# Patient Record
Sex: Female | Born: 1985
Health system: Southern US, Community
[De-identification: ages and names within clinical notes are randomized; demographics above are authoritative.]

## PROBLEM LIST (undated history)

## (undated) DIAGNOSIS — K219 Gastro-esophageal reflux disease without esophagitis: Secondary | ICD-10-CM

## (undated) DIAGNOSIS — K921 Melena: Secondary | ICD-10-CM

## (undated) DIAGNOSIS — K760 Fatty (change of) liver, not elsewhere classified: Secondary | ICD-10-CM

## (undated) DIAGNOSIS — M2550 Pain in unspecified joint: Secondary | ICD-10-CM

## (undated) DIAGNOSIS — F32A Depression, unspecified: Secondary | ICD-10-CM

## (undated) DIAGNOSIS — R7303 Prediabetes: Secondary | ICD-10-CM

## (undated) DIAGNOSIS — J383 Other diseases of vocal cords: Secondary | ICD-10-CM

## (undated) DIAGNOSIS — F1020 Alcohol dependence, uncomplicated: Secondary | ICD-10-CM

## (undated) DIAGNOSIS — G43909 Migraine, unspecified, not intractable, without status migrainosus: Secondary | ICD-10-CM

## (undated) DIAGNOSIS — R0602 Shortness of breath: Secondary | ICD-10-CM

## (undated) DIAGNOSIS — K7030 Alcoholic cirrhosis of liver without ascites: Secondary | ICD-10-CM

## (undated) DIAGNOSIS — E7800 Pure hypercholesterolemia, unspecified: Secondary | ICD-10-CM

## (undated) DIAGNOSIS — R002 Palpitations: Secondary | ICD-10-CM

## (undated) DIAGNOSIS — E669 Obesity, unspecified: Secondary | ICD-10-CM

## (undated) DIAGNOSIS — E559 Vitamin D deficiency, unspecified: Secondary | ICD-10-CM

## (undated) DIAGNOSIS — I10 Essential (primary) hypertension: Secondary | ICD-10-CM

## (undated) DIAGNOSIS — R519 Headache, unspecified: Secondary | ICD-10-CM

## (undated) DIAGNOSIS — B019 Varicella without complication: Secondary | ICD-10-CM

## (undated) DIAGNOSIS — E282 Polycystic ovarian syndrome: Secondary | ICD-10-CM

## (undated) DIAGNOSIS — R51 Headache: Secondary | ICD-10-CM

## (undated) DIAGNOSIS — F419 Anxiety disorder, unspecified: Secondary | ICD-10-CM

## (undated) DIAGNOSIS — E538 Deficiency of other specified B group vitamins: Secondary | ICD-10-CM

## (undated) DIAGNOSIS — M549 Dorsalgia, unspecified: Secondary | ICD-10-CM

## (undated) DIAGNOSIS — F319 Bipolar disorder, unspecified: Secondary | ICD-10-CM

## (undated) DIAGNOSIS — R079 Chest pain, unspecified: Secondary | ICD-10-CM

## (undated) DIAGNOSIS — F329 Major depressive disorder, single episode, unspecified: Secondary | ICD-10-CM

## (undated) DIAGNOSIS — D649 Anemia, unspecified: Secondary | ICD-10-CM

## (undated) DIAGNOSIS — N390 Urinary tract infection, site not specified: Secondary | ICD-10-CM

## (undated) DIAGNOSIS — B977 Papillomavirus as the cause of diseases classified elsewhere: Secondary | ICD-10-CM

## (undated) DIAGNOSIS — S2239XA Fracture of one rib, unspecified side, initial encounter for closed fracture: Secondary | ICD-10-CM

## (undated) HISTORY — DX: Headache, unspecified: R51.9

## (undated) HISTORY — DX: Anemia, unspecified: D64.9

## (undated) HISTORY — DX: Fatty (change of) liver, not elsewhere classified: K76.0

## (undated) HISTORY — DX: Anxiety disorder, unspecified: F41.9

## (undated) HISTORY — DX: Dorsalgia, unspecified: M54.9

## (undated) HISTORY — DX: Deficiency of other specified B group vitamins: E53.8

## (undated) HISTORY — DX: Pure hypercholesterolemia, unspecified: E78.00

## (undated) HISTORY — DX: Gastro-esophageal reflux disease without esophagitis: K21.9

## (undated) HISTORY — DX: Varicella without complication: B01.9

## (undated) HISTORY — DX: Obesity, unspecified: E66.9

## (undated) HISTORY — DX: Headache: R51

## (undated) HISTORY — DX: Alcohol dependence, uncomplicated: F10.20

## (undated) HISTORY — DX: Bipolar disorder, unspecified: F31.9

## (undated) HISTORY — DX: Vitamin D deficiency, unspecified: E55.9

## (undated) HISTORY — DX: Urinary tract infection, site not specified: N39.0

## (undated) HISTORY — DX: Prediabetes: R73.03

## (undated) HISTORY — DX: Palpitations: R00.2

## (undated) HISTORY — DX: Chest pain, unspecified: R07.9

## (undated) HISTORY — DX: Melena: K92.1

## (undated) HISTORY — DX: Fracture of one rib, unspecified side, initial encounter for closed fracture: S22.39XA

## (undated) HISTORY — DX: Pain in unspecified joint: M25.50

## (undated) HISTORY — PX: WISDOM TOOTH EXTRACTION: SHX21

## (undated) HISTORY — DX: Shortness of breath: R06.02

---
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
## 2007-03-23 ENCOUNTER — Inpatient Hospital Stay (HOSPITAL_COMMUNITY): Admission: AD | Admit: 2007-03-23 | Discharge: 2007-04-02 | Payer: Self-pay | Admitting: Obstetrics & Gynecology

## 2007-03-31 ENCOUNTER — Encounter (INDEPENDENT_AMBULATORY_CARE_PROVIDER_SITE_OTHER): Payer: Self-pay | Admitting: Obstetrics & Gynecology

## 2007-04-09 ENCOUNTER — Encounter: Admission: RE | Admit: 2007-04-09 | Discharge: 2007-05-09 | Payer: Self-pay | Admitting: Obstetrics & Gynecology

## 2007-05-10 ENCOUNTER — Encounter: Admission: RE | Admit: 2007-05-10 | Discharge: 2007-05-17 | Payer: Self-pay | Admitting: Obstetrics & Gynecology

## 2007-11-23 ENCOUNTER — Encounter: Admission: RE | Admit: 2007-11-23 | Discharge: 2008-02-21 | Payer: Self-pay | Admitting: Family Medicine

## 2008-02-07 ENCOUNTER — Ambulatory Visit (HOSPITAL_COMMUNITY): Admission: RE | Admit: 2008-02-07 | Discharge: 2008-02-07 | Payer: Self-pay | Admitting: Obstetrics and Gynecology

## 2008-08-09 ENCOUNTER — Ambulatory Visit (HOSPITAL_COMMUNITY): Admission: RE | Admit: 2008-08-09 | Discharge: 2008-08-09 | Payer: Self-pay | Admitting: Family Medicine

## 2008-08-09 ENCOUNTER — Encounter: Payer: Self-pay | Admitting: Pulmonary Disease

## 2008-08-21 ENCOUNTER — Ambulatory Visit: Payer: Self-pay | Admitting: Pulmonary Disease

## 2008-08-21 DIAGNOSIS — J383 Other diseases of vocal cords: Secondary | ICD-10-CM | POA: Insufficient documentation

## 2008-08-21 DIAGNOSIS — J45909 Unspecified asthma, uncomplicated: Secondary | ICD-10-CM | POA: Insufficient documentation

## 2008-08-21 DIAGNOSIS — I10 Essential (primary) hypertension: Secondary | ICD-10-CM | POA: Insufficient documentation

## 2008-09-14 ENCOUNTER — Encounter: Payer: Self-pay | Admitting: Pulmonary Disease

## 2008-09-14 ENCOUNTER — Encounter: Admission: RE | Admit: 2008-09-14 | Discharge: 2008-09-28 | Payer: Self-pay | Admitting: Pulmonary Disease

## 2008-10-15 ENCOUNTER — Encounter: Payer: Self-pay | Admitting: Pulmonary Disease

## 2008-11-02 ENCOUNTER — Emergency Department (HOSPITAL_BASED_OUTPATIENT_CLINIC_OR_DEPARTMENT_OTHER): Admission: EM | Admit: 2008-11-02 | Discharge: 2008-11-02 | Payer: Self-pay | Admitting: Emergency Medicine

## 2008-11-02 ENCOUNTER — Ambulatory Visit: Payer: Self-pay | Admitting: Interventional Radiology

## 2011-01-14 LAB — URINALYSIS, ROUTINE W REFLEX MICROSCOPIC
Bilirubin Urine: NEGATIVE
Ketones, ur: NEGATIVE mg/dL
Nitrite: NEGATIVE
Protein, ur: NEGATIVE mg/dL
Urobilinogen, UA: 0.2 mg/dL (ref 0.0–1.0)
pH: 7 (ref 5.0–8.0)

## 2011-01-14 LAB — PREGNANCY, URINE: Preg Test, Ur: NEGATIVE

## 2011-01-21 ENCOUNTER — Other Ambulatory Visit: Payer: Self-pay | Admitting: Obstetrics and Gynecology

## 2011-02-11 NOTE — Discharge Summary (Signed)
Brandi Kirk, Brandi Kirk               ACCOUNT NO.:  [id number]   MEDICAL RECORD NO.:  [id number]          PATIENT TYPE:  INP   LOCATION:  9315                          FACILITY:  [facility]   PHYSICIAN:  Alden Hipp, M.D.   DATE OF BIRTH:  Apr 23, 1986   DATE OF ADMISSION:  03/23/2007  DATE OF DISCHARGE:  04/02/2007                               DISCHARGE SUMMARY   FINAL DIAGNOSIS:  1. Intrauterine pregnancy at [redacted] weeks gestation, delivered  2. Preterm premature rupture of membranes, at 32 weeks  3. Spontaneous vaginal delivery of a female infant with Apgars of 7 and      8.   COMPLICATIONS:  None.   This 25 year old, G1, P0 presents at [redacted] weeks gestation with preterm  premature rupture of membranes.  The patient started developing leaking  of fluid while she was visiting in [place].  On March 15, 2007,  preterm premature rupture of membranes was confirmed there.  She  received a course of betamethasone and 6 days of IV antibiotics.  The  patient wanted to return to [place], left [place] against medical  advice, and presents now to the [facility].  The patient did have  an ultrasound that showed an AFI of 4.3.  The baby was having  appropriate growth and was vertex in presentation.  Her antepartum  course had been complicated by an abnormal 1-hour sugar test; her 3-hour  was normal.  She also has a history of bipolar disorder, is not on any  medication during this pregnancy, and is also needing a rubella vaccine  for an equivocal immune status.  The patient was admitted for  observation at this time.  She was continued on amoxicillin with an  unknown group B strep status.  The patient said her group B strep was  positive in [place].  We were trying to get those records.  The  patient continued to be monitored and was doing well.  No signs of  chorioamnionitis.  A decision was made that an induction would be begun  on April 02, 2007 at 34 weeks, but starting around  hospital day #8, the  patient started to have some contractions.  When she was checked by Dr.  Stann Mainland on the morning of March 31, 2007, she was about 6 cm dilated.  The  patient had a spontaneous vaginal delivery of a 5 pound 12 ounce female  infant with Apgars of 7 and 8.  The baby was taken to the NICU.  The  delivery was over an intact perineum.  Delivery went without  complications.  The patient's postoperative course was uncomplicated.  She did have a couple elevated blood pressures on postpartum day #1.  The baby was stable in the NICU.  The patient was felt ready for  discharge on postpartum day #2.   DIET:  She was sent home on a regular diet.   ACTIVITY:  Told to decrease activities.   DISCHARGE MEDICATIONS:  1. Told to continue her prenatal vitamins.  2. She was told she could use over-the-counter ibuprofen up to 600 mg  every 6 hours as needed for pain.   FOLLOWUP:  She was to follow up in our office in 4 weeks, and of course  to call with any increased pain, bleeding, fever or problems.   LABORATORIES ON DISCHARGE:  Hemoglobin of 11.5, white blood cell count  of 12.4, platelets of 252,000.  Group B strep culture that we had  obtained in our hospital was negative.      Jeannette How, P.A.-C.      Alden Hipp, M.D.  Electronically Signed    MB/MEDQ  D:  05/05/2007  T:  05/05/2007  Job:  [id number]

## 2011-07-15 LAB — CBC
HCT: 34.3 — ABNORMAL LOW
Hemoglobin: 12.3
MCHC: 33.5
MCHC: 33.6
MCV: 88.6
MCV: 88.6
Platelets: 237
Platelets: 256
RBC: 3.87
RBC: 3.93
RDW: 13.2
WBC: 12.4 — ABNORMAL HIGH
WBC: 17.4 — ABNORMAL HIGH

## 2011-07-15 LAB — COMPREHENSIVE METABOLIC PANEL
ALT: 22
AST: 24
Albumin: 2.2 — ABNORMAL LOW
Calcium: 9
Chloride: 107
Creatinine, Ser: 0.54
GFR calc Af Amer: >60
Sodium: 136
Total Bilirubin: 0.9

## 2011-07-16 LAB — DIFFERENTIAL
Basophils Relative: 1
Eosinophils Absolute: 0
Eosinophils Absolute: 0.1
Eosinophils Relative: 0
Eosinophils Relative: 1
Lymphocytes Relative: 18 — ABNORMAL LOW
Lymphs Abs: 1.6
Lymphs Abs: 2.4
Monocytes Relative: 11 — ABNORMAL HIGH
Monocytes Relative: 8
Neutrophils Relative %: 77 — ABNORMAL HIGH

## 2011-07-16 LAB — CBC
HCT: 35.6 — ABNORMAL LOW
HCT: 35.7 — ABNORMAL LOW
HCT: 36
Hemoglobin: 12.3
MCHC: 33.2
MCHC: 34.4
MCV: 88.9
MCV: 89
MCV: 89.9
Platelets: 251
Platelets: 258
RBC: 3.97
RBC: 4.05
RDW: 12.8
WBC: 12.1 — ABNORMAL HIGH
WBC: 13.2 — ABNORMAL HIGH

## 2011-09-12 ENCOUNTER — Emergency Department
Admission: EM | Admit: 2011-09-12 | Discharge: 2011-09-12 | Disposition: A | Discharge status: Home / Self Care | Payer: Self-pay | Source: Home / Self Care | Attending: Emergency Medicine | Admitting: Emergency Medicine

## 2011-09-12 ENCOUNTER — Encounter: Payer: Self-pay | Admitting: Emergency Medicine

## 2011-09-12 DIAGNOSIS — J069 Acute upper respiratory infection, unspecified: Secondary | ICD-10-CM

## 2011-09-12 DIAGNOSIS — J329 Chronic sinusitis, unspecified: Secondary | ICD-10-CM

## 2011-09-12 MED ORDER — AMOXICILLIN 875 MG PO TABS: 875.0000 mg | ORAL_TABLET | Freq: Two times a day (BID) | ORAL | Status: AC

## 2011-09-12 NOTE — ED Provider Notes (Signed)
History     CSN: [id number] Arrival date & time: 09/12/2011 10:10 AM   First MD Initiated Contact with Patient 09/12/11 1006      Chief Complaint  Patient presents with  . Nasal Congestion    (Consider location/radiation/quality/duration/timing/severity/associated sxs/prior treatment) HPI Brandi Kirk is a 25 y.o. female who complains of onset of cold symptoms for 2 days.  She has been sick over the last week or 2 with upper respiratory infection also. No sore throat - resolved + cough No pleuritic pain No wheezing + nasal congestion + post-nasal drainage ++ sinus pain/pressure No chest congestion No itchy/red eyes No earache No hemoptysis No SOB No chills/sweats + fever - resolved No nausea No vomiting No abdominal pain No diarrhea No skin rashes No fatigue No myalgias + headache    History reviewed. No pertinent past medical history.  No past surgical history on file.  No family history on file.  History  Substance Use Topics  . Smoking status: Not on file  . Smokeless tobacco: Not on file  . Alcohol Use: Not on file    OB History    Grav Para Term Preterm Abortions TAB SAB Ect Mult Living                  Review of Systems  Allergies  Review of patient's allergies indicates no known allergies.  Home Medications   Current Outpatient Rx  Name Route Sig Dispense Refill  . COLD AND FLU PO Oral Take by mouth.        BP 119/87  Pulse 86  Temp(Src) 98.5 F (36.9 C) (Oral)  Resp 18  Ht 5' 4"  (1.626 m)  Wt 234 lb (106.142 kg)  BMI 40.17 kg/m2  SpO2 97%  Physical Exam  Nursing note and vitals reviewed. Constitutional: She is oriented to person, place, and time. She appears well-developed and well-nourished.  HENT:  Head: Normocephalic and atraumatic.  Right Ear: Tympanic membrane, external ear and ear canal normal.  Left Ear: Tympanic membrane, external ear and ear canal normal.  Nose: Mucosal edema and rhinorrhea present. Right sinus  exhibits maxillary sinus tenderness. Left sinus exhibits maxillary sinus tenderness.  Mouth/Throat: Posterior oropharyngeal erythema present. No oropharyngeal exudate or posterior oropharyngeal edema.  Eyes: No scleral icterus.  Neck: Neck supple.  Cardiovascular: Regular rhythm and normal heart sounds.   Pulmonary/Chest: Effort normal and breath sounds normal. No respiratory distress.  Neurological: She is alert and oriented to person, place, and time.  Skin: Skin is warm and dry.  Psychiatric: She has a normal mood and affect. Her speech is normal.    ED Course  Procedures (including critical care time)  Labs Reviewed - No data to display No results found.   1. Acute upper respiratory infections of unspecified site       MDM  1)  Take the prescribed antibiotic as instructed. 2)  Use nasal saline solution (over the counter) at least 3 times a day. 3)  Use over the counter decongestants like Zyrtec-D every 12 hours as needed to help with congestion.  If you have hypertension, do not take medicines with sudafed.  4)  Can take tylenol every 6 hours or motrin every 8 hours for pain or fever. 5)  Follow up with your primary doctor if no improvement in 5-7 days, sooner if increasing pain, fever, or new symptoms.       Desiree Lucy, MD 09/12/11 1021

## 2011-09-12 NOTE — ED Notes (Signed)
Congestion since yesterday

## 2012-07-01 ENCOUNTER — Encounter: Payer: Self-pay | Admitting: *Deleted

## 2012-07-01 ENCOUNTER — Emergency Department
Admission: EM | Admit: 2012-07-01 | Discharge: 2012-07-01 | Disposition: A | Discharge status: Home / Self Care | Payer: Self-pay | Source: Home / Self Care | Attending: Family Medicine | Admitting: Family Medicine

## 2012-07-01 DIAGNOSIS — J209 Acute bronchitis, unspecified: Secondary | ICD-10-CM

## 2012-07-01 HISTORY — DX: Other diseases of vocal cords: J38.3

## 2012-07-01 MED ORDER — GUAIFENESIN-CODEINE 100-10 MG/5ML PO SYRP: 10.0000 mL | ORAL_SOLUTION | Freq: Every day | ORAL | Status: DC

## 2012-07-01 MED ORDER — CLARITHROMYCIN 500 MG PO TABS: 500.0000 mg | ORAL_TABLET | Freq: Two times a day (BID) | ORAL | Status: DC

## 2012-07-01 MED ORDER — PREDNISONE 20 MG PO TABS: 20.0000 mg | ORAL_TABLET | Freq: Two times a day (BID) | ORAL | Status: DC

## 2012-07-01 NOTE — ED Provider Notes (Signed)
History     CSN: [id number]  Arrival date & time 07/01/12  0093   First MD Initiated Contact with Patient 07/01/12 0827      Chief Complaint  Patient presents with  . Cough      HPI Comments: Patient complains of approximately 3 week history of gradually progressive URI symptoms beginning with a mild sore throat (now resolved), followed by progressive nasal congestion.  A cough started about 2 days ago.  Complains of fatigue and initial myalgias.  Cough is now worse at night and generally non-productive during the day.  There has been no pleuritic pain or wheezing, but she becomes short of breath with persistent coughing.  She often coughs until she gags.  She does not remember her last Tdap.   The history is provided by the patient.    Past Medical History  Diagnosis Date  . Vocal cord dysfunction   . Tension headache     History reviewed. No pertinent past surgical history.  Family History  Problem Relation Age of Onset  . Diabetes Mother   . Stroke Mother   . Rheum arthritis Sister   . Heart murmur Sister   . Heart murmur Brother     History  Substance Use Topics  . Smoking status: Current Some Day Smoker  . Smokeless tobacco: Not on file  . Alcohol Use: Yes    OB History    Grav Para Term Preterm Abortions TAB SAB Ect Mult Living                  Review of Systems + sore throat, resolved + cough No pleuritic pain, but has tightness in anterior chest No wheezing + nasal congestion ? post-nasal drainage No sinus pain/pressure No itchy/red eyes No earache No hemoptysis + SOB with activity No fever, + chills No nausea No vomiting No abdominal pain No diarrhea No urinary symptoms No skin rashes + fatigue No myalgias + headache Used OTC meds without relief  Allergies  Review of patient's allergies indicates no known allergies.  Home Medications   Current Outpatient Rx  Name Route Sig Dispense Refill  . CLARITHROMYCIN 500 MG PO TABS Oral  Take 1 tablet (500 mg total) by mouth 2 (two) times daily. 14 tablet 0  . GUAIFENESIN-CODEINE 100-10 MG/5ML PO SYRP Oral Take 10 mLs by mouth at bedtime. for cough 120 mL 0  . COLD AND FLU PO Oral Take by mouth.      Marland Kitchen PREDNISONE 20 MG PO TABS Oral Take 1 tablet (20 mg total) by mouth 2 (two) times daily. Take with food. 10 tablet 0    BP 111/74  Pulse 61  Temp 98.1 F (36.7 C) (Oral)  Resp 16  Ht 5' 4"  (1.626 m)  Wt 221 lb (100.245 kg)  BMI 37.93 kg/m2  SpO2 98%  Physical Exam Nursing notes and Vital Signs reviewed. Appearance:  Patient appears stated age, and in no acute distress but she coughs frequently.  Patient is obese (BMI 37.9) Eyes:  Pupils are equal, round, and reactive to light and accomodation.  Extraocular movement is intact.  Conjunctivae are not inflamed  Ears:  Canals normal.  Tympanic membranes normal.  Nose:  Mildly congested turbinates.  No sinus tenderness.    Pharynx:  Normal Neck:  Supple.   No adenopathy Lungs:  Clear to auscultation.  Breath sounds are equal.  Heart:  Regular rate and rhythm without murmurs, rubs, or gallops.  Abdomen:  Nontender without masses or  hepatosplenomegaly.  Bowel sounds are present.  No CVA or flank tenderness.  Extremities:  No edema.  No calf tenderness Skin:  No rash present.   ED Course  Procedures none      1. Acute bronchitis;  Suspect pertussis       MDM  Patient has no insurance and prefers to defer testing (chest X-ray and pertussis culture) Begin Biaxin and prednisone burst.  Cough suppressant at bedtime. Take plain Mucinex (guaifenesin) twice daily for cough and congestion.  Increase fluid intake, rest. May use Afrin nasal spray (or generic oxymetazoline) twice daily for about 5 days.  Also recommend using saline nasal spray several times daily and saline nasal irrigation (AYR is a common brand) Stop all antihistamines for now, and other non-prescription cough/cold preparations. Recommend a Tdap when well.    Follow-up with family doctor if not improving 7 to 10 days.         Kandra Nicolas, MD 07/01/12 1225

## 2012-07-01 NOTE — ED Notes (Signed)
Patient c/o productive cough x 3 weeks. HA, ear pain and fatigue. Taken Nyquil and Mucinex OTC.

## 2012-11-12 ENCOUNTER — Emergency Department
Admission: EM | Admit: 2012-11-12 | Discharge: 2012-11-12 | Disposition: A | Discharge status: Home / Self Care | Payer: Self-pay | Source: Home / Self Care

## 2012-11-12 ENCOUNTER — Encounter: Payer: Self-pay | Admitting: *Deleted

## 2012-11-12 DIAGNOSIS — Z3202 Encounter for pregnancy test, result negative: Secondary | ICD-10-CM

## 2012-11-12 DIAGNOSIS — R5381 Other malaise: Secondary | ICD-10-CM

## 2012-11-12 DIAGNOSIS — R5383 Other fatigue: Secondary | ICD-10-CM

## 2012-11-12 HISTORY — DX: Papillomavirus as the cause of diseases classified elsewhere: B97.7

## 2012-11-12 HISTORY — DX: Polycystic ovarian syndrome: E28.2

## 2012-11-12 LAB — POCT CBC W AUTO DIFF (K'VILLE URGENT CARE)

## 2012-11-12 LAB — COMPREHENSIVE METABOLIC PANEL
BUN: 13 mg/dL (ref 6–23)
CO2: 24 mEq/L (ref 19–32)
Creat: 0.53 mg/dL (ref 0.50–1.10)
Glucose, Bld: 107 mg/dL — ABNORMAL HIGH (ref 70–99)
Sodium: 139 mEq/L (ref 135–145)
Total Bilirubin: 0.7 mg/dL (ref 0.3–1.2)
Total Protein: 7 g/dL (ref 6.0–8.3)

## 2012-11-12 LAB — POCT URINE PREGNANCY: Preg Test, Ur: NEGATIVE

## 2012-11-12 NOTE — ED Provider Notes (Signed)
History     CSN: [id number]  Arrival date & time 11/12/12  1603   None     Chief Complaint  Patient presents with  . Nausea  . Emesis  . Fatigue  . Headache  . Dizziness       HPI Comments: Patient complains of 4 to 5 week history of persistent fatigue, hot flashes, intermittent dizziness, and nausea with occasional vomiting.  No fevers, chills, and sweats.  No urinary symptoms.  She states that she feels like she is pregnant, but has done four home pregnancy tests that were negative.  She has the [facility] IUD in place that is now due for removal.  She has had no pelvic pain, and no vaginal spotting for about 1.5 months.  The history is provided by the patient.    Past Medical History  Diagnosis Date  . Vocal cord dysfunction   . Tension headache   . HPV in female   . PCOS (polycystic ovarian syndrome)     History reviewed. No pertinent past surgical history.  Family History  Problem Relation Age of Onset  . Diabetes Mother   . Stroke Mother   . Rheum arthritis Sister   . Heart murmur Sister   . Heart murmur Brother     History  Substance Use Topics  . Smoking status: Current Some Day Smoker  . Smokeless tobacco: Not on file  . Alcohol Use: Yes    OB History   Grav Para Term Preterm Abortions TAB SAB Ect Mult Living                  Review of Systems  Constitutional: Positive for fatigue. Negative for fever, chills, diaphoresis, activity change, appetite change and unexpected weight change.  Eyes: Negative.   Respiratory: Positive for shortness of breath.   Cardiovascular: Negative.   Gastrointestinal: Positive for nausea and vomiting. Negative for diarrhea.  Endocrine: Negative.   Genitourinary: Negative.   Musculoskeletal: Negative.   Skin: Negative.     Allergies  Review of patient's allergies indicates no known allergies.  Home Medications   Current Outpatient Rx  Name  Route  Sig  Dispense  Refill  . clarithromycin (BIAXIN) 500 MG tablet  Oral   Take 1 tablet (500 mg total) by mouth 2 (two) times daily.   14 tablet   0   . guaiFENesin-codeine (ROBITUSSIN AC) 100-10 MG/5ML syrup   Oral   Take 10 mLs by mouth at bedtime. for cough   120 mL   0   . Nutritional Supplements (COLD AND FLU PO)   Oral   Take by mouth.           . predniSONE (DELTASONE) 20 MG tablet   Oral   Take 1 tablet (20 mg total) by mouth 2 (two) times daily. Take with food.   10 tablet   0     BP 114/74  Pulse 66  Temp(Src) 98 F (36.7 C) (Oral)  Ht 5' 4"  (1.626 m)  Wt 205 lb (92.987 kg)  BMI 35.17 kg/m2  SpO2 98%  Physical Exam Nursing notes and Vital Signs reviewed. Appearance:  Patient appears stated age, and in no acute distress.  Patient is obese (BMI 35.2) Eyes:  Pupils are equal, round, and reactive to light and accomodation.  Extraocular movement is intact.  Conjunctivae are not inflamed  Ears:  Canals normal.  Tympanic membranes normal.  Nose:  Mildly congested turbinates.  No sinus tenderness.  Mouth:  Normal  Pharynx:  Normal Neck:  Supple.  No adenopathy or thyromegaly. Lungs:  Clear to auscultation.  Breath sounds are equal.  Heart:  Regular rate and rhythm without murmurs, rubs, or gallops.  Abdomen:  Nontender without masses or hepatosplenomegaly.  Bowel sounds are present.  No CVA or flank tenderness.  Extremities:  No edema.  No calf tenderness Skin:  No rash present.   ED Course  Procedures  none  Labs Reviewed  COMPREHENSIVE METABOLIC PANEL - Abnormal; Notable for the following:    Glucose, Bld 107 (*)    All other components within normal limits   Narrative:    Performed at:  [facility], [address]                [place], [place] [address]  HCG, QUANTITATIVE, PREGNANCY   Narrative:    Performed at:  [facility], [address]                [place], [place] [address]  TSH   Narrative:    Performed at:  [facility]                 [address], [address]                [place], [place] [address]  POCT URINE PREGNANCY negative  POCT CBC W AUTO DIFF ([facility])  WBC 8.6; LY 30.9; MO 4.3; GR 64.8; Hgb 12.4; Platelets 284       1. Other malaise and fatigue; Rule out pregnancy       MDM  Check serum HCG.  TSH and CMP pending. Followup with GYN        Kandra Nicolas, MD 11/14/12 [phone]

## 2012-11-12 NOTE — ED Notes (Signed)
Pt c/o nausea, vomiting, fatigue, hot flashes, HA and dizziness x 4 wks on and off. She reports taking a home pregnancy test yesterday that was negative.

## 2013-05-26 ENCOUNTER — Encounter: Payer: Self-pay | Admitting: *Deleted

## 2013-05-26 ENCOUNTER — Emergency Department
Admission: EM | Admit: 2013-05-26 | Discharge: 2013-05-26 | Disposition: A | Discharge status: Home / Self Care | Payer: Self-pay | Source: Home / Self Care | Attending: Emergency Medicine | Admitting: Emergency Medicine

## 2013-05-26 DIAGNOSIS — J309 Allergic rhinitis, unspecified: Secondary | ICD-10-CM

## 2013-05-26 DIAGNOSIS — R21 Rash and other nonspecific skin eruption: Secondary | ICD-10-CM

## 2013-05-26 HISTORY — DX: Depression, unspecified: F32.A

## 2013-05-26 HISTORY — DX: Major depressive disorder, single episode, unspecified: F32.9

## 2013-05-26 MED ORDER — PREDNISONE 10 MG PO TABS: ORAL_TABLET | ORAL | Status: DC

## 2013-05-26 MED ORDER — PERMETHRIN 5 % EX CREA: TOPICAL_CREAM | Freq: Once | CUTANEOUS | Status: DC

## 2013-05-26 NOTE — ED Notes (Signed)
Perian c/o dry itchy rash that started on her buttocks 3 weeks ago while walking around [place]. Rash has since spread to her arms, chest and trunk. Used benadryl for itching. Also c/o bilateral ear pain for 3 weeks. Denies fever.

## 2013-05-26 NOTE — ED Provider Notes (Signed)
CSN: [id number]     Arrival date & time 05/26/13  4235 History   First MD Initiated Contact with Patient 05/26/13 0830     Chief Complaint  Patient presents with  . Rash  . Otalgia   (Consider location/radiation/quality/duration/timing/severity/associated sxs/prior Treatment) HPI This patient complains of a RASH  Location: whole body, started on buttock, has spread to whole body (spared face)  Onset: 3 weeks ago when at [place]: intermittently worsening Self-treated with: Benedryl             Improvement with treatment: helps  History Itching: yes  Tenderness: no  New medications/antibiotics: no  Pet exposure: no  Recent travel or tropical exposure: yes, to [employer] soaps, shampoos, detergent, clothing: no  Tick/insect exposure: no   Red Flags Feeling ill: no  Fever: no  Facial/tongue swelling/difficulty breathing:  no  Diabetic or immunocompromised: no    She also states that her ears are hurting intermittently for the last few weeks. Has allergies and has been having some rhinorrhea and congestion, treated with Sudafed and Benedryl.  No fever, chills, cough.    Past Medical History  Diagnosis Date  . Vocal cord dysfunction   . Tension headache   . HPV in female   . PCOS (polycystic ovarian syndrome)   . Depression    History reviewed. No pertinent past surgical history. Family History  Problem Relation Age of Onset  . Diabetes Mother   . Stroke Mother   . Rheum arthritis Sister   . Heart murmur Sister   . Heart murmur Brother    History  Substance Use Topics  . Smoking status: Former Research scientist (life sciences)  . Smokeless tobacco: Never Used  . Alcohol Use: Yes   OB History   Grav Para Term Preterm Abortions TAB SAB Ect Mult Living                 Review of Systems  All other systems reviewed and are negative.    Allergies  Ciprofloxacin  Home Medications   Current Outpatient Rx  Name  Route  Sig  Dispense  Refill  . FLUoxetine (PROZAC)  20 MG tablet   Oral   Take 25 mg by mouth daily.         . permethrin (ACTICIN) 5 % cream   Topical   Apply topically once. Apply chin to toes, keep on 12 hours, wash off.  Repeat in 2 weeks if needed.   60 g   1   . predniSONE (DELTASONE) 10 MG tablet      75m BID x3 days, then 161mBID x3 days, then 1073mD x3 days, then stop.  Disp QS   21 tablet   0    BP 118/85  Pulse 76  Temp(Src) 98.2 F (36.8 C) (Oral)  Resp 14  Ht 5' 4"  (1.626 m)  Wt 215 lb (97.523 kg)  BMI 36.89 kg/m2  SpO2 96% Physical Exam  Nursing note and vitals reviewed. Constitutional: She is oriented to person, place, and time. She appears well-developed and well-nourished.  HENT:  Head: Normocephalic and atraumatic.  Right Ear: Tympanic membrane, external ear and ear canal normal.  Left Ear: Tympanic membrane, external ear and ear canal normal.  Nose: Nose normal.  Mouth/Throat: Uvula is midline and oropharynx is clear and moist.  Eyes: No scleral icterus.  Neck: Neck supple.  Cardiovascular: Regular rhythm and normal heart sounds.   Pulmonary/Chest: Effort normal and breath sounds normal.  No respiratory distress.  Neurological: She is alert and oriented to person, place, and time.  Skin: Skin is warm and dry.  Scattered erythematous maculopapular rash with excoriations, no cellulitis, no tenderness, no induration or fluctuance  Psychiatric: She has a normal mood and affect. Her speech is normal.    ED Course  Procedures (including critical care time) Labs Review Labs Reviewed - No data to display Imaging Review No results found.  MDM   1. Rash and nonspecific skin eruption    Due to patient's rash, will treat with prednisone taper for 9 days which should treat most underlying disorders.  DDx also includes scabies so will treat with Elimite cream just in case.  Ear pain will likely be helped with prednisone as I don't see any infectious cause.  Likely allergic congestion.  Follow up with  PCP if not improving in either of the issues.    Janeann Forehand, MD 05/26/13 0900

## 2014-08-08 ENCOUNTER — Emergency Department (INDEPENDENT_AMBULATORY_CARE_PROVIDER_SITE_OTHER): Payer: Self-pay

## 2014-08-08 ENCOUNTER — Encounter: Payer: Self-pay | Admitting: *Deleted

## 2014-08-08 ENCOUNTER — Emergency Department
Admission: EM | Admit: 2014-08-08 | Discharge: 2014-08-08 | Disposition: A | Discharge status: Home / Self Care | Payer: Self-pay | Source: Home / Self Care | Attending: Emergency Medicine | Admitting: Emergency Medicine

## 2014-08-08 DIAGNOSIS — M542 Cervicalgia: Secondary | ICD-10-CM

## 2014-08-08 DIAGNOSIS — T1490XA Injury, unspecified, initial encounter: Secondary | ICD-10-CM

## 2014-08-08 DIAGNOSIS — S161XXA Strain of muscle, fascia and tendon at neck level, initial encounter: Secondary | ICD-10-CM

## 2014-08-08 DIAGNOSIS — S1093XA Contusion of unspecified part of neck, initial encounter: Secondary | ICD-10-CM

## 2014-08-08 DIAGNOSIS — M4312 Spondylolisthesis, cervical region: Secondary | ICD-10-CM

## 2014-08-08 MED ORDER — IBUPROFEN 200 MG PO TABS: ORAL_TABLET | ORAL | Status: DC

## 2014-08-08 MED ORDER — CYCLOBENZAPRINE HCL 5 MG PO TABS: ORAL_TABLET | ORAL | Status: DC

## 2014-08-08 NOTE — ED Notes (Signed)
Pt c/o upper back and neck pain x 4 wks after her garage door closed on her. She c/o dizziness intermittently when turning her neck too fast.

## 2014-08-08 NOTE — Discharge Instructions (Signed)
Soft Tissue Injury of the Neck  A soft tissue injury of the neck needs medical care right away. These injuries are often caused by a direct hit to the neck. Some injuries do not break the skin (blunt injury). Some injuries do break the skin (penetrating injury) and create an open wound. You may feel fine at first, but the puffiness (swelling) in your throat can slowly make it harder to breathe. This could cause serious or life-threatening injury. There could be damage to major blood vessels and nerves in the neck. Neck injuries need to be checked by a doctor. HOME CARE  If the skin was broken, keep the area clean and dry. Care for your wound as told by your doctor.  Follow your doctor's diet advice.  Follow your doctor's advice about using your voice.  Only take medicines as told by your doctor.  Keep your head and neck raised (elevated). Do this while you sleep, too. GET HELP RIGHT AWAY IF:  Your voice gets weaker.  Your puffiness or bruising does not get better.  You have problems with your medicines.  You see fluid coming from the wound.  Your pain gets worse, or you have trouble swallowing.  You cough up blood.  You have trouble breathing.  You start to drool.  You start throwing up (vomiting).  You have new puffiness in the neck or face.  You have a temperature by mouth above 102 F (38.9 C), not controlled by medicine. MAKE SURE YOU:  Understand these instructions.  Will watch your condition.  Will get help right away if you are not doing well or get worse. Document Released: 12/26/2010 Document Revised: 12/08/2011 Document Reviewed: 12/26/2010 [facility] Patient Information 2015 [place], [place]. This information is not intended to replace advice given to you by your health care provider. Make sure you discuss any questions you have with your health care provider.

## 2014-08-08 NOTE — ED Provider Notes (Signed)
CSN: [id number]     Arrival date & time 08/08/14  0813 History   First MD Initiated Contact with Patient 08/08/14 0820     Chief Complaint  Patient presents with  . Neck Injury  . Back Pain  Pt c/o upper back and neck pain x 4 wks after her garage door closed on her. She c/o dizziness intermittently when turning her neck too fast.   HPI About 3-1/2 or 4 weeks ago, she was underneath her garage door which was coming down, and the garage door struck her posterior neck. No loss of consciousness. She states she was initially dazed for a minute or 2. No focal neurologic symptoms. No focal weakness or numbness.  Ever since then, has "sore, radiating" pain posterior neck, constant, 7 out of 10 that can radiate to bilateral paracervical area and posterior occipital area. This worsens when she turns her head to either side or flexes or extends her neck.. Has a mild, general posterior headache. No vision change. No photophobia. Denies nausea or vomiting. Complains of intermittent intermittent lightheadedness when turning her neck too fast. No definite vertigo. No balance problems. Questioned her further about any back pain, and she states the neck pain radiates down from the upper C-spine to the lower to the C-spine, but no other back pain. No shoulder pain. No extremity symptoms. She is able to concentrate and think normally. History of cluster headaches about a year ago, but she states this does not feel like a cluster headache.  Denies chance of pregnancy. Has Mirena IUD. Past Medical History  Diagnosis Date  . Vocal cord dysfunction   . Tension headache   . HPV in female   . PCOS (polycystic ovarian syndrome)   . Depression    Past Surgical History  Procedure Laterality Date  . Wisdom tooth extraction     Family History  Problem Relation Age of Onset  . Diabetes Mother   . Stroke Mother   . Hypertension Mother   . Heart murmur Mother   . Rheum arthritis Sister   . Heart murmur  Sister   . Heart murmur Brother    History  Substance Use Topics  . Smoking status: Current Every Day Smoker -- 1.00 packs/day    Types: Cigarettes  . Smokeless tobacco: Never Used  . Alcohol Use: Yes   OB History    No data available     Review of Systems  Constitutional: Negative for fever.  Respiratory: Negative for shortness of breath.   Cardiovascular: Negative for chest pain.  Gastrointestinal: Negative for nausea, vomiting and abdominal pain.  Neurological: Negative for seizures and syncope.  Psychiatric/Behavioral: Negative for hallucinations and confusion.  All other systems reviewed and are negative.   Allergies  Ciprofloxacin  Home Medications   Prior to Admission medications   Medication Sig Start Date End Date Taking? Authorizing Provider  phentermine 37.5 MG capsule Take 37.5 mg by mouth every morning.   Yes Historical Provider, MD  cyclobenzaprine (FLEXERIL) 5 MG tablet Take 1 or 2 every 8 hours as needed for muscle relaxant. Caution: May cause drowsiness. 08/08/14   Jacqulyn Cane, MD  ibuprofen (ADVIL,MOTRIN) 200 MG tablet Take three tablets ( 600 milligrams total) every 6 with food as needed for pain. 08/08/14   Jacqulyn Cane, MD   BP 120/80 mmHg  Pulse 66  Temp(Src) 98.4 F (36.9 C) (Oral)  Resp 18  Ht 5' 4"  (1.626 m)  Wt 192 lb (87.091 kg)  BMI 32.94 kg/m2  SpO2 99% Physical Exam  Constitutional: She is oriented to person, place, and time. She appears well-developed and well-nourished.  Non-toxic appearance. She appears distressed (Uncomfortable from posterior neck pain.).  HENT:  Head: Normocephalic and atraumatic. Head is without abrasion and without contusion.  Right Ear: External ear normal.  Left Ear: External ear normal.  Nose: Nose normal.  Mouth/Throat: Oropharynx is clear and moist.  Eyes: Conjunctivae and EOM are normal. Pupils are equal, round, and reactive to light. Right conjunctiva has no hemorrhage. Left conjunctiva has no  hemorrhage. No scleral icterus.  Fundoscopic exam:      The right eye shows no hemorrhage and no papilledema.       The left eye shows no hemorrhage and no papilledema.  Neck: Trachea normal. Neck supple. Normal carotid pulses present. No thyroid mass present.  Cardiovascular: Regular rhythm and normal heart sounds.   Pulmonary/Chest: Effort normal and breath sounds normal. No respiratory distress.  Musculoskeletal:       Cervical back: She exhibits decreased range of motion (and posterior neck pain exacerbated with forward flexion and lateral bending), tenderness (Directly over the posterior occipital area, and all along C-spine in midline), bony tenderness and spasm (Posterior cervical muscles.). She exhibits no swelling, no edema, no deformity, no laceration and normal pulse.       Thoracic back: Normal.       Lumbar back: Normal.  Lymphadenopathy:       Head (right side): No occipital adenopathy present.       Head (left side): No occipital adenopathy present.    She has no cervical adenopathy.  Neurological: She is alert and oriented to person, place, and time. She has normal strength and normal reflexes. She displays no atrophy and no tremor. No cranial nerve deficit or sensory deficit. She exhibits normal muscle tone. Gait normal.  Reflex Scores:      Tricep reflexes are 2+ on the right side and 2+ on the left side.      Bicep reflexes are 2+ on the right side and 2+ on the left side.      Brachioradialis reflexes are 2+ on the right side and 2+ on the left side.      Patellar reflexes are 2+ on the right side and 2+ on the left side.      Achilles reflexes are 2+ on the right side and 2+ on the left side. Skin: Skin is warm, dry and intact. No bruising, no laceration, no lesion and no rash noted.  Psychiatric: She has a normal mood and affect.  Nursing note and vitals reviewed.   ED Course  Procedures (including critical care time) Labs Review Labs Reviewed - No data to  display  Imaging Review Dg Cervical Spine Complete  08/08/2014   CLINICAL DATA:  Posterior neck pain for 3 weeks after hit by garage door. Initial encounter.  EXAM: CERVICAL SPINE  4+ VIEWS  COMPARISON:  None.  FINDINGS: No evidence of cervical spine fracture.  Slight (1-2 mm) anterolisthesis at C3-4. No prevertebral swelling. Degenerative changes or endplate erosion. Clear apical lungs.  IMPRESSION: 1.  No evidence of cervical spine fracture. 2. Slight C3-4 anterolisthesis. If ligamentous injury is a clinical possibility, flexion extension radiography could be performed to exclude hypermobility at this level.   Electronically Signed   By: Jorje Guild M.D.   On: 08/08/2014 09:06     MDM   1. Neck contusion, initial encounter   2. Posterior neck pain   3.  Injury   4. Neck strain, initial encounter    Reviewed above x-ray report at length. No evidence of C-spine fracture. She declined any other imaging after lengthy discussion.  Treatment options discussed, as well as risks, benefits, alternatives. Patient voiced understanding and agreement with the following plans: Discharge Medication List as of 08/08/2014  9:25 AM    START taking these medications   Details  cyclobenzaprine (FLEXERIL) 5 MG tablet Take 1 or 2 every 8 hours as needed for muscle relaxant. Caution: May cause drowsiness., Print    ibuprofen (ADVIL,MOTRIN) 200 MG tablet Take three tablets ( 600 milligrams total) every 6 with food as needed for pain., No Print       Other symptomatic care discussed. See detailed Instructions in AVS, which were given to patient. Verbal instructions also given. Risks, benefits, and alternatives of treatment options discussed. Questions invited and answered. Patient voiced understanding and agreement with plans.     Jacqulyn Cane, MD 08/08/14 [phone]

## 2014-11-14 ENCOUNTER — Encounter: Payer: Self-pay | Admitting: *Deleted

## 2014-11-14 ENCOUNTER — Emergency Department (INDEPENDENT_AMBULATORY_CARE_PROVIDER_SITE_OTHER): Payer: Self-pay

## 2014-11-14 ENCOUNTER — Emergency Department
Admission: EM | Admit: 2014-11-14 | Discharge: 2014-11-14 | Disposition: A | Discharge status: Home / Self Care | Payer: Self-pay | Source: Home / Self Care | Attending: Emergency Medicine | Admitting: Emergency Medicine

## 2014-11-14 DIAGNOSIS — M542 Cervicalgia: Secondary | ICD-10-CM

## 2014-11-14 DIAGNOSIS — S40012A Contusion of left shoulder, initial encounter: Secondary | ICD-10-CM

## 2014-11-14 DIAGNOSIS — M25512 Pain in left shoulder: Secondary | ICD-10-CM

## 2014-11-14 DIAGNOSIS — S161XXA Strain of muscle, fascia and tendon at neck level, initial encounter: Secondary | ICD-10-CM

## 2014-11-14 DIAGNOSIS — S40022A Contusion of left upper arm, initial encounter: Secondary | ICD-10-CM

## 2014-11-14 DIAGNOSIS — W19XXXA Unspecified fall, initial encounter: Secondary | ICD-10-CM

## 2014-11-14 MED ORDER — HYDROCODONE-ACETAMINOPHEN 5-325 MG PO TABS
2.0000 | ORAL_TABLET | ORAL | Status: DC | PRN
Start: 2014-11-14 — End: 2014-11-14

## 2014-11-14 MED ORDER — CYCLOBENZAPRINE HCL 5 MG PO TABS: ORAL_TABLET | ORAL | Status: DC

## 2014-11-14 MED ORDER — IBUPROFEN 800 MG PO TABS: 800.0000 mg | ORAL_TABLET | Freq: Three times a day (TID) | ORAL | Status: DC

## 2014-11-14 NOTE — ED Provider Notes (Signed)
CSN: [id number]     Arrival date & time 11/14/14  1038 History   First MD Initiated Contact with Patient 11/14/14 1120     Chief Complaint  Patient presents with  . Neck Pain  . Shoulder Injury   (Consider location/radiation/quality/duration/timing/severity/associated sxs/prior Treatment) Patient is a 29 y.o. female presenting with neck pain and shoulder injury. The history is provided by the patient. No language interpreter was used.  Neck Pain Pain location:  Generalized neck Quality:  Aching Pain radiates to:  Does not radiate Pain severity:  Moderate Pain is:  Same all the time Onset quality:  Gradual Duration:  4 days Timing:  Constant Progression:  Worsening Chronicity:  New Context: fall   Relieved by:  Muscle relaxants and NSAIDs Worsened by:  Twisting Ineffective treatments:  None tried Associated symptoms: no numbness, no paresis and no weakness   Risk factors: no hx of spinal trauma   Shoulder Injury    Past Medical History  Diagnosis Date  . Vocal cord dysfunction   . Tension headache   . HPV in female   . PCOS (polycystic ovarian syndrome)   . Depression    Past Surgical History  Procedure Laterality Date  . Wisdom tooth extraction     Family History  Problem Relation Age of Onset  . Diabetes Mother   . Stroke Mother   . Hypertension Mother   . Heart murmur Mother   . Rheum arthritis Sister   . Heart murmur Sister   . Heart murmur Brother    History  Substance Use Topics  . Smoking status: Current Every Day Smoker -- 1.00 packs/day    Types: Cigarettes  . Smokeless tobacco: Never Used  . Alcohol Use: Yes   OB History    No data available     Review of Systems  Musculoskeletal: Positive for neck pain.  Neurological: Negative for weakness and numbness.  All other systems reviewed and are negative.   Allergies  Ciprofloxacin and Septra  Home Medications   Prior to Admission medications   Medication Sig Start Date End Date Taking?  Authorizing Provider  cyclobenzaprine (FLEXERIL) 5 MG tablet Take 1 or 2 every 8 hours as needed for muscle relaxant. Caution: May cause drowsiness. 11/14/14   Fransico Meadow, PA-C  phentermine 37.5 MG capsule Take 37.5 mg by mouth every morning.    Historical Provider, MD   BP 115/74 mmHg  Pulse 86  Resp 14  Wt 195 lb (88.451 kg)  SpO2 97% Physical Exam  Constitutional: She appears well-developed and well-nourished.  HENT:  Head: Normocephalic.  Eyes: Pupils are equal, round, and reactive to light.  Neck: Neck supple.  Cardiovascular: Normal rate.   Pulmonary/Chest: Effort normal.  Abdominal: Soft.  Musculoskeletal: She exhibits tenderness.  Tender cervical spine  Pain with range of motion,  Tender left shoulder and left lateral chest and scapula,  Abrasions right upper back,  Neurological: She is alert.  Skin: Skin is warm.  Nursing note and vitals reviewed.   ED Course  Procedures (including critical care time) Labs Review Labs Reviewed - No data to display  Imaging Review Dg Chest 2 View  11/14/2014   CLINICAL DATA:  29 year old female fell done 18 steps with acute pain. Initial encounter.  EXAM: CHEST  2 VIEW  COMPARISON:  CT Abdomen and Pelvis to for 2010.  FINDINGS: Lung volumes within normal limits. Normal cardiac size and mediastinal contours. Visualized tracheal air column is within normal limits. The lungs are  clear. No pneumothorax or pleural effusion. Mild levo convex thoracic curvature. No acute osseous abnormality identified.  IMPRESSION: No acute cardiopulmonary abnormality or acute traumatic injury identified.   Electronically Signed   By: Genevie Ann M.D.   On: 11/14/2014 12:11   Dg Cervical Spine Complete  11/14/2014   CLINICAL DATA:  29 year old female fell done 18 steps several days ago. Left cervical neck pain radiating to the scapula. Decreased range of motion. Initial encounter.  EXAM: CERVICAL SPINE  4+ VIEWS  COMPARISON:  08/08/2014.  FINDINGS: Improved  cervical lordosis compared to the prior. Prevertebral soft tissue contour within normal limits. Cervicothoracic junction alignment is within normal limits. Relatively preserved disc spaces. Bilateral posterior element alignment is within normal limits. AP alignment and lung apices within normal limits. C1-C2 alignment and odontoid within normal limits.  IMPRESSION: No acute fracture or listhesis identified in the cervical spine. Ligamentous injury is not excluded.   Electronically Signed   By: Genevie Ann M.D.   On: 11/14/2014 12:10   Dg Shoulder Left  11/14/2014   CLINICAL DATA:  [place] down steps  4 days ago.  Pain  EXAM: LEFT SHOULDER - 2+ VIEW  COMPARISON:  None.  FINDINGS: There is no evidence of fracture or dislocation. There is no evidence of arthropathy or other focal bone abnormality. Soft tissues are unremarkable.  IMPRESSION: Negative.   Electronically Signed   By: Franchot Gallo M.D.   On: 11/14/2014 12:10     MDM   1. Contusion shoulder/arm, left, initial encounter   2. Fall   3. Cervical strain, acute, initial encounter    Flexeril Ibuprofen (Pt declined pain medication)  Follow up with Dr. Darene Lamer if symptoms persist past one week.    [place], PA-C 11/14/14 1302

## 2014-11-14 NOTE — Discharge Instructions (Signed)
Cervical Sprain A cervical sprain is an injury in the neck in which the strong, fibrous tissues (ligaments) that connect your neck bones stretch or tear. Cervical sprains can range from mild to severe. Severe cervical sprains can cause the neck vertebrae to be unstable. This can lead to damage of the spinal cord and can result in serious nervous system problems. The amount of time it takes for a cervical sprain to get better depends on the cause and extent of the injury. Most cervical sprains heal in 1 to 3 weeks. CAUSES  Severe cervical sprains may be caused by:   Contact sport injuries (such as from football, rugby, wrestling, hockey, auto racing, gymnastics, diving, martial arts, or boxing).   Motor vehicle collisions.   Whiplash injuries. This is an injury from a sudden forward and backward whipping movement of the head and neck.  Falls.  Mild cervical sprains may be caused by:   Being in an awkward position, such as while cradling a telephone between your ear and shoulder.   Sitting in a chair that does not offer proper support.   Working at a poorly Landscape architect station.   Looking up or down for long periods of time.  SYMPTOMS   Pain, soreness, stiffness, or a burning sensation in the front, back, or sides of the neck. This discomfort may develop immediately after the injury or slowly, 24 hours or more after the injury.   Pain or tenderness directly in the middle of the back of the neck.   Shoulder or upper back pain.   Limited ability to move the neck.   Headache.   Dizziness.   Weakness, numbness, or tingling in the hands or arms.   Muscle spasms.   Difficulty swallowing or chewing.   Tenderness and swelling of the neck.  DIAGNOSIS  Most of the time your health care provider can diagnose a cervical sprain by taking your history and doing a physical exam. Your health care provider will ask about previous neck injuries and any known neck  problems, such as arthritis in the neck. X-rays may be taken to find out if there are any other problems, such as with the bones of the neck. Other tests, such as a CT scan or MRI, may also be needed.  TREATMENT  Treatment depends on the severity of the cervical sprain. Mild sprains can be treated with rest, keeping the neck in place (immobilization), and pain medicines. Severe cervical sprains are immediately immobilized. Further treatment is done to help with pain, muscle spasms, and other symptoms and may include:  Medicines, such as pain relievers, numbing medicines, or muscle relaxants.   Physical therapy. This may involve stretching exercises, strengthening exercises, and posture training. Exercises and improved posture can help stabilize the neck, strengthen muscles, and help stop symptoms from returning.  HOME CARE INSTRUCTIONS   Put ice on the injured area.   Put ice in a plastic bag.   Place a towel between your skin and the bag.   Leave the ice on for 15-20 minutes, 3-4 times a day.   If your injury was severe, you may have been given a cervical collar to wear. A cervical collar is a two-piece collar designed to keep your neck from moving while it heals.  Do not remove the collar unless instructed by your health care provider.  If you have long hair, keep it outside of the collar.  Ask your health care provider before making any adjustments to your collar. Minor  adjustments may be required over time to improve comfort and reduce pressure on your chin or on the back of your head.  Ifyou are allowed to remove the collar for cleaning or bathing, follow your health care provider's instructions on how to do so safely.  Keep your collar clean by wiping it with mild soap and water and drying it completely. If the collar you have been given includes removable pads, remove them every 1-2 days and hand wash them with soap and water. Allow them to air dry. They should be completely  dry before you wear them in the collar.  If you are allowed to remove the collar for cleaning and bathing, wash and dry the skin of your neck. Check your skin for irritation or sores. If you see any, tell your health care provider.  Do not drive while wearing the collar.   Only take over-the-counter or prescription medicines for pain, discomfort, or fever as directed by your health care provider.   Keep all follow-up appointments as directed by your health care provider.   Keep all physical therapy appointments as directed by your health care provider.   Make any needed adjustments to your workstation to promote good posture.   Avoid positions and activities that make your symptoms worse.   Warm up and stretch before being active to help prevent problems.  SEEK MEDICAL CARE IF:   Your pain is not controlled with medicine.   You are unable to decrease your pain medicine over time as planned.   Your activity level is not improving as expected.  SEEK IMMEDIATE MEDICAL CARE IF:   You develop any bleeding.  You develop stomach upset.  You have signs of an allergic reaction to your medicine.   Your symptoms get worse.   You develop new, unexplained symptoms.   You have numbness, tingling, weakness, or paralysis in any part of your body.  MAKE SURE YOU:   Understand these instructions.  Will watch your condition.  Will get help right away if you are not doing well or get worse. Document Released: 07/13/2007 Document Revised: 09/20/2013 Document Reviewed: 03/23/2013 [facility] Patient Information 2015 [place], [place]. This information is not intended to replace advice given to you by your health care provider. Make sure you discuss any questions you have with your health care provider. contusionContusion A contusion is a deep bruise. Contusions are the result of an injury that caused bleeding under the skin. The contusion may turn blue, purple, or yellow. Minor  injuries will give you a painless contusion, but more severe contusions may stay painful and swollen for a few weeks.  CAUSES  A contusion is usually caused by a blow, trauma, or direct force to an area of the body. SYMPTOMS   Swelling and redness of the injured area.  Bruising of the injured area.  Tenderness and soreness of the injured area.  Pain. DIAGNOSIS  The diagnosis can be made by taking a history and physical exam. An X-ray, CT scan, or MRI may be needed to determine if there were any associated injuries, such as fractures. TREATMENT  Specific treatment will depend on what area of the body was injured. In general, the best treatment for a contusion is resting, icing, elevating, and applying cold compresses to the injured area. Over-the-counter medicines may also be recommended for pain control. Ask your caregiver what the best treatment is for your contusion. HOME CARE INSTRUCTIONS   Put ice on the injured area.  Put ice in  a plastic bag.  Place a towel between your skin and the bag.  Leave the ice on for 15-20 minutes, 3-4 times a day, or as directed by your health care provider.  Only take over-the-counter or prescription medicines for pain, discomfort, or fever as directed by your caregiver. Your caregiver may recommend avoiding anti-inflammatory medicines (aspirin, ibuprofen, and naproxen) for 48 hours because these medicines may increase bruising.  Rest the injured area.  If possible, elevate the injured area to reduce swelling. SEEK IMMEDIATE MEDICAL CARE IF:   You have increased bruising or swelling.  You have pain that is getting worse.  Your swelling or pain is not relieved with medicines. MAKE SURE YOU:   Understand these instructions.  Will watch your condition.  Will get help right away if you are not doing well or get worse. Document Released: 06/25/2005 Document Revised: 09/20/2013 Document Reviewed: 07/21/2011 [facility] Patient Information 2015  [place], [place]. This information is not intended to replace advice given to you by your health care provider. Make sure you discuss any questions you have with your health care provider. Fall Prevention and Home Safety Falls cause injuries and can affect all age groups. It is possible to use preventive measures to significantly decrease the likelihood of falls. There are many simple measures which can make your home safer and prevent falls. OUTDOORS  Repair cracks and edges of walkways and driveways.  Remove high doorway thresholds.  Trim shrubbery on the main path into your home.  Have good outside lighting.  Clear walkways of tools, rocks, debris, and clutter.  Check that handrails are not broken and are securely fastened. Both sides of steps should have handrails.  Have leaves, snow, and ice cleared regularly.  Use sand or salt on walkways during winter months.  In the garage, clean up grease or oil spills. BATHROOM  Install night lights.  Install grab bars by the toilet and in the tub and shower.  Use non-skid mats or decals in the tub or shower.  Place a plastic non-slip stool in the shower to sit on, if needed.  Keep floors dry and clean up all water on the floor immediately.  Remove soap buildup in the tub or shower on a regular basis.  Secure bath mats with non-slip, double-sided rug tape.  Remove throw rugs and tripping hazards from the floors. BEDROOMS  Install night lights.  Make sure a bedside light is easy to reach.  Do not use oversized bedding.  Keep a telephone by your bedside.  Have a firm chair with side arms to use for getting dressed.  Remove throw rugs and tripping hazards from the floor. KITCHEN  Keep handles on pots and pans turned toward the center of the stove. Use back burners when possible.  Clean up spills quickly and allow time for drying.  Avoid walking on wet floors.  Avoid hot utensils and knives.  Position shelves so they  are not too high or low.  Place commonly used objects within easy reach.  If necessary, use a sturdy step stool with a grab bar when reaching.  Keep electrical cables out of the way.  Do not use floor polish or wax that makes floors slippery. If you must use wax, use non-skid floor wax.  Remove throw rugs and tripping hazards from the floor. STAIRWAYS  Never leave objects on stairs.  Place handrails on both sides of stairways and use them. Fix any loose handrails. Make sure handrails on both sides of the  stairways are as long as the stairs.  Check carpeting to make sure it is firmly attached along stairs. Make repairs to worn or loose carpet promptly.  Avoid placing throw rugs at the top or bottom of stairways, or properly secure the rug with carpet tape to prevent slippage. Get rid of throw rugs, if possible.  Have an electrician put in a light switch at the top and bottom of the stairs. OTHER FALL PREVENTION TIPS  Wear low-heel or rubber-soled shoes that are supportive and fit well. Wear closed toe shoes.  When using a stepladder, make sure it is fully opened and both spreaders are firmly locked. Do not climb a closed stepladder.  Add color or contrast paint or tape to grab bars and handrails in your home. Place contrasting color strips on first and last steps.  Learn and use mobility aids as needed. Install an electrical emergency response system.  Turn on lights to avoid dark areas. Replace light bulbs that burn out immediately. Get light switches that glow.  Arrange furniture to create clear pathways. Keep furniture in the same place.  Firmly attach carpet with non-skid or double-sided tape.  Eliminate uneven floor surfaces.  Select a carpet pattern that does not visually hide the edge of steps.  Be aware of all pets. OTHER HOME SAFETY TIPS  Set the water temperature for 120 F (48.8 C).  Keep emergency numbers on or near the telephone.  Keep smoke detectors on  every level of the home and near sleeping areas. Document Released: 09/05/2002 Document Revised: 03/16/2012 Document Reviewed: 12/05/2011 [facility] Patient Information 2015 [place], [place]. This information is not intended to replace advice given to you by your health care provider. Make sure you discuss any questions you have with your health care provider.

## 2014-11-14 NOTE — ED Notes (Signed)
Brandi Kirk reports falling down stairs 4 days ago onto left side. C/o left shoulder, arm and neck pain. Taking 856m IBF and Flexeril without much relief.

## 2015-12-27 ENCOUNTER — Other Ambulatory Visit (HOSPITAL_BASED_OUTPATIENT_CLINIC_OR_DEPARTMENT_OTHER): Payer: Self-pay | Admitting: Family Medicine

## 2015-12-27 DIAGNOSIS — G43809 Other migraine, not intractable, without status migrainosus: Secondary | ICD-10-CM

## 2015-12-31 ENCOUNTER — Ambulatory Visit (INDEPENDENT_AMBULATORY_CARE_PROVIDER_SITE_OTHER): Payer: 59

## 2015-12-31 DIAGNOSIS — G43809 Other migraine, not intractable, without status migrainosus: Secondary | ICD-10-CM

## 2015-12-31 MED ORDER — GADOBENATE DIMEGLUMINE 529 MG/ML IV SOLN
20.0000 mL | Freq: Once | INTRAVENOUS | Status: AC | PRN
  Administered 2015-12-31: 18 mL via INTRAVENOUS

## 2016-03-04 ENCOUNTER — Emergency Department
Admission: EM | Admit: 2016-03-04 | Discharge: 2016-03-04 | Disposition: A | Discharge status: Home / Self Care | Payer: 59 | Source: Home / Self Care

## 2016-03-04 ENCOUNTER — Encounter: Payer: Self-pay | Admitting: *Deleted

## 2016-03-04 DIAGNOSIS — M542 Cervicalgia: Secondary | ICD-10-CM

## 2016-03-04 DIAGNOSIS — M545 Low back pain: Secondary | ICD-10-CM

## 2016-03-04 HISTORY — DX: Migraine, unspecified, not intractable, without status migrainosus: G43.909

## 2016-03-04 LAB — POCT URINALYSIS DIP (MANUAL ENTRY)
BILIRUBIN UA: NEGATIVE
GLUCOSE UA: NEGATIVE
Ketones, POC UA: NEGATIVE
Leukocytes, UA: NEGATIVE
Nitrite, UA: NEGATIVE
Protein Ur, POC: NEGATIVE
RBC UA: NEGATIVE
SPEC GRAV UA: 1.02 (ref 1.005–1.03)
Urobilinogen, UA: 0.2 (ref 0–1)
pH, UA: 6.5 (ref 5–8)

## 2016-03-04 MED ORDER — DICLOFENAC SODIUM 50 MG PO TBEC: 50.0000 mg | DELAYED_RELEASE_TABLET | Freq: Two times a day (BID) | ORAL | Status: DC

## 2016-03-04 MED ORDER — METHOCARBAMOL 500 MG PO TABS: 500.0000 mg | ORAL_TABLET | Freq: Two times a day (BID) | ORAL | Status: DC

## 2016-03-04 NOTE — Discharge Instructions (Signed)

## 2016-03-04 NOTE — ED Notes (Signed)
Pt c/o mid back pain x 8 days. Denies injury or dysuria. She reports a hx of kidney stones, but states it doesn't feel the like that. She has taken Advil.

## 2016-03-04 NOTE — ED Provider Notes (Signed)
CSN: [id number]     Arrival date & time 03/04/16  1054 History   None    Chief Complaint  Patient presents with  . Back Pain   (Consider location/radiation/quality/duration/timing/severity/associated sxs/prior Treatment) Patient is a 30 y.o. female presenting with back pain. The history is provided by the patient. No language interpreter was used.  Back Pain Location:  Lumbar spine Quality:  Aching Radiates to:  Does not radiate Pain severity:  Moderate Pain is:  Same all the time Onset quality:  Gradual Duration:  1 week Timing:  Constant Progression:  Worsening Chronicity:  New Context: not recent illness and not recent injury   Relieved by:  Nothing Worsened by:  Nothing tried Ineffective treatments:  None tried Associated symptoms: no dysuria, no fever, no headaches, no leg pain, no numbness and no paresthesias   Risk factors: no hx of osteoporosis     Past Medical History  Diagnosis Date  . Vocal cord dysfunction   . Tension headache   . HPV in female   . PCOS (polycystic ovarian syndrome)   . Depression   . Migraine    Past Surgical History  Procedure Laterality Date  . Wisdom tooth extraction     Family History  Problem Relation Age of Onset  . Diabetes Mother   . Stroke Mother   . Hypertension Mother   . Heart murmur Mother   . Rheum arthritis Sister   . Heart murmur Sister   . Heart murmur Brother    Social History  Substance Use Topics  . Smoking status: Current Every Day Smoker -- 1.00 packs/day    Types: Cigarettes  . Smokeless tobacco: Never Used  . Alcohol Use: Yes   OB History    No data available     Review of Systems  Constitutional: Negative for fever.  Genitourinary: Negative for dysuria.  Musculoskeletal: Positive for back pain.  Neurological: Negative for numbness, headaches and paresthesias.  All other systems reviewed and are negative.   Allergies  Ciprofloxacin and Septra  Home Medications   Prior to Admission  medications   Medication Sig Start Date End Date Taking? Authorizing Provider  levonorgestrel (MIRENA) 20 MCG/24HR IUD 1 each by Intrauterine route once.   Yes Historical Provider, MD  pantoprazole (PROTONIX) 40 MG tablet Take 40 mg by mouth daily.   Yes Historical Provider, MD  propranolol (INDERAL) 40 MG tablet Take 80 mg by mouth daily.   Yes Historical Provider, MD  diclofenac (VOLTAREN) 50 MG EC tablet Take 1 tablet (50 mg total) by mouth 2 (two) times daily. 03/04/16   Fransico Meadow, PA-C  methocarbamol (ROBAXIN) 500 MG tablet Take 1 tablet (500 mg total) by mouth 2 (two) times daily. 03/04/16   Fransico Meadow, PA-C  phentermine 37.5 MG capsule Take 37.5 mg by mouth every morning.    Historical Provider, MD   Meds Ordered and Administered this Visit  Medications - No data to display  BP 140/85 mmHg  Pulse 65  Temp(Src) 98.5 F (36.9 C) (Oral)  Resp 18  Ht 5' 4"  (1.626 m)  Wt 208 lb (94.348 kg)  BMI 35.69 kg/m2  SpO2 99% No data found.   Physical Exam  Constitutional: She appears well-developed and well-nourished.  HENT:  Head: Normocephalic.  Nose: Nose normal.  Mouth/Throat: Oropharynx is clear and moist.  Eyes: Pupils are equal, round, and reactive to light.  Neck: Normal range of motion. Neck supple.  Cardiovascular: Normal rate and normal heart sounds.  Pulmonary/Chest: Effort normal.  Abdominal: Soft. There is no tenderness.  Musculoskeletal: She exhibits tenderness.  Tender lower back lower lumbar sacral area pain with movement  Neurological: She is alert.  Skin: Skin is warm.  Nursing note and vitals reviewed.   ED Course  Procedures (including critical care time)  Labs Review Labs Reviewed  POCT URINALYSIS DIP (MANUAL ENTRY)    Imaging Review No results found.   Visual Acuity Review  Right Eye Distance:   Left Eye Distance:   Bilateral Distance:    Right Eye Near:   Left Eye Near:    Bilateral Near:         MDM Pt advised to see her Md  for recheck   1. Low back pain without sciatica, unspecified back pain laterality    Meds ordered this encounter  Medications  . propranolol (INDERAL) 40 MG tablet    Sig: Take 80 mg by mouth daily.  Marland Kitchen levonorgestrel (MIRENA) 20 MCG/24HR IUD    Sig: 1 each by Intrauterine route once.  . pantoprazole (PROTONIX) 40 MG tablet    Sig: Take 40 mg by mouth daily.  . methocarbamol (ROBAXIN) 500 MG tablet    Sig: Take 1 tablet (500 mg total) by mouth 2 (two) times daily.    Dispense:  40 tablet    Refill:  0    Order Specific Question:  Supervising Provider    Answer:  Burnett Harry, DAVID [[id number]]  . diclofenac (VOLTAREN) 50 MG EC tablet    Sig: Take 1 tablet (50 mg total) by mouth 2 (two) times daily.    Dispense:  20 tablet    Refill:  0    Order Specific Question:  Supervising Provider    Answer:  Burnett Harry, DAVID [[id number]]  An After Visit Summary was printed and given to the patient.    Parsons, PA-C 03/04/16 1203

## 2016-03-18 DIAGNOSIS — M549 Dorsalgia, unspecified: Secondary | ICD-10-CM

## 2016-03-18 HISTORY — DX: Dorsalgia, unspecified: M54.9

## 2016-10-01 DIAGNOSIS — J069 Acute upper respiratory infection, unspecified: Secondary | ICD-10-CM | POA: Diagnosis not present

## 2016-10-01 DIAGNOSIS — J060 Acute laryngopharyngitis: Secondary | ICD-10-CM | POA: Diagnosis not present

## 2016-11-25 ENCOUNTER — Observation Stay (HOSPITAL_COMMUNITY)
Admission: EM | Admit: 2016-11-25 | Discharge: 2016-11-26 | Disposition: A | Discharge status: Home / Self Care | Payer: 59 | Attending: Internal Medicine | Admitting: Internal Medicine

## 2016-11-25 ENCOUNTER — Inpatient Hospital Stay (HOSPITAL_COMMUNITY): Payer: 59

## 2016-11-25 ENCOUNTER — Encounter (HOSPITAL_COMMUNITY): Payer: Self-pay | Admitting: Emergency Medicine

## 2016-11-25 DIAGNOSIS — Z882 Allergy status to sulfonamides status: Secondary | ICD-10-CM | POA: Diagnosis not present

## 2016-11-25 DIAGNOSIS — K7030 Alcoholic cirrhosis of liver without ascites: Secondary | ICD-10-CM | POA: Diagnosis present

## 2016-11-25 DIAGNOSIS — J45909 Unspecified asthma, uncomplicated: Secondary | ICD-10-CM | POA: Diagnosis present

## 2016-11-25 DIAGNOSIS — R748 Abnormal levels of other serum enzymes: Secondary | ICD-10-CM | POA: Diagnosis present

## 2016-11-25 DIAGNOSIS — Z79899 Other long term (current) drug therapy: Secondary | ICD-10-CM | POA: Insufficient documentation

## 2016-11-25 DIAGNOSIS — D62 Acute posthemorrhagic anemia: Secondary | ICD-10-CM | POA: Insufficient documentation

## 2016-11-25 DIAGNOSIS — K920 Hematemesis: Secondary | ICD-10-CM | POA: Insufficient documentation

## 2016-11-25 DIAGNOSIS — J383 Other diseases of vocal cords: Secondary | ICD-10-CM | POA: Diagnosis not present

## 2016-11-25 DIAGNOSIS — F10239 Alcohol dependence with withdrawal, unspecified: Secondary | ICD-10-CM | POA: Diagnosis not present

## 2016-11-25 DIAGNOSIS — F10231 Alcohol dependence with withdrawal delirium: Secondary | ICD-10-CM | POA: Diagnosis present

## 2016-11-25 DIAGNOSIS — F10931 Alcohol use, unspecified with withdrawal delirium: Secondary | ICD-10-CM | POA: Diagnosis present

## 2016-11-25 DIAGNOSIS — Y900 Blood alcohol level of less than 20 mg/100 ml: Secondary | ICD-10-CM | POA: Insufficient documentation

## 2016-11-25 DIAGNOSIS — F17210 Nicotine dependence, cigarettes, uncomplicated: Secondary | ICD-10-CM | POA: Diagnosis not present

## 2016-11-25 DIAGNOSIS — K7460 Unspecified cirrhosis of liver: Secondary | ICD-10-CM | POA: Diagnosis not present

## 2016-11-25 DIAGNOSIS — E669 Obesity, unspecified: Secondary | ICD-10-CM | POA: Insufficient documentation

## 2016-11-25 DIAGNOSIS — R739 Hyperglycemia, unspecified: Secondary | ICD-10-CM | POA: Diagnosis not present

## 2016-11-25 DIAGNOSIS — Z6841 Body Mass Index (BMI) 40.0 and over, adult: Secondary | ICD-10-CM | POA: Insufficient documentation

## 2016-11-25 DIAGNOSIS — I10 Essential (primary) hypertension: Secondary | ICD-10-CM | POA: Insufficient documentation

## 2016-11-25 DIAGNOSIS — Z793 Long term (current) use of hormonal contraceptives: Secondary | ICD-10-CM | POA: Insufficient documentation

## 2016-11-25 DIAGNOSIS — Z791 Long term (current) use of non-steroidal anti-inflammatories (NSAID): Secondary | ICD-10-CM | POA: Diagnosis not present

## 2016-11-25 DIAGNOSIS — R7989 Other specified abnormal findings of blood chemistry: Secondary | ICD-10-CM | POA: Diagnosis present

## 2016-11-25 LAB — CBC
HEMATOCRIT: 40.9 % (ref 36.0–46.0)
HEMOGLOBIN: 13.7 g/dL (ref 12.0–15.0)
MCH: 31.1 pg (ref 26.0–34.0)
MCHC: 33.5 g/dL (ref 30.0–36.0)
MCV: 93 fL (ref 78.0–100.0)
Platelets: 349 10*3/uL (ref 150–400)
RBC: 4.4 MIL/uL (ref 3.87–5.11)
RDW: 13.9 % (ref 11.5–15.5)
WBC: 11.2 10*3/uL — ABNORMAL HIGH (ref 4.0–10.5)

## 2016-11-25 LAB — MAGNESIUM: MAGNESIUM: 2.1 mg/dL (ref 1.7–2.4)

## 2016-11-25 LAB — COMPREHENSIVE METABOLIC PANEL
ALBUMIN: 4.6 g/dL (ref 3.5–5.0)
ALT: 65 U/L — ABNORMAL HIGH (ref 14–54)
ANION GAP: 21 — AB (ref 5–15)
AST: 77 U/L — ABNORMAL HIGH (ref 15–41)
Alkaline Phosphatase: 68 U/L (ref 38–126)
BUN: 10 mg/dL (ref 6–20)
CO2: 19 mmol/L — ABNORMAL LOW (ref 22–32)
Calcium: 9.1 mg/dL (ref 8.9–10.3)
Chloride: 98 mmol/L — ABNORMAL LOW (ref 101–111)
Creatinine, Ser: 0.8 mg/dL (ref 0.44–1.00)
GFR calc Af Amer: >60 mL/min (ref >60)
GFR calc non Af Amer: >60 mL/min (ref >60)
GLUCOSE: 130 mg/dL — AB (ref 65–99)
POTASSIUM: 4.1 mmol/L (ref 3.5–5.1)
SODIUM: 138 mmol/L (ref 135–145)
Total Bilirubin: 1.5 mg/dL — ABNORMAL HIGH (ref 0.3–1.2)
Total Protein: 8.2 g/dL — ABNORMAL HIGH (ref 6.5–8.1)

## 2016-11-25 LAB — PHOSPHORUS: Phosphorus: 2.3 mg/dL — ABNORMAL LOW (ref 2.5–4.6)

## 2016-11-25 LAB — I-STAT BETA HCG BLOOD, ED (MC, WL, AP ONLY): I-stat hCG, quantitative: <5 m[IU]/mL (ref <5)

## 2016-11-25 LAB — LIPASE, BLOOD: Lipase: 18 U/L (ref 11–51)

## 2016-11-25 LAB — MRSA PCR SCREENING: MRSA BY PCR: NEGATIVE

## 2016-11-25 LAB — ETHANOL: Alcohol, Ethyl (B): <5 mg/dL (ref <5)

## 2016-11-25 MED ORDER — ONDANSETRON HCL 4 MG/2ML IJ SOLN
4.0000 mg | Freq: Once | INTRAMUSCULAR | Status: AC
  Administered 2016-11-25: 4 mg via INTRAVENOUS
  Filled 2016-11-25: qty 2

## 2016-11-25 MED ORDER — LORAZEPAM 1 MG PO TABS
0.0000 mg | ORAL_TABLET | Freq: Four times a day (QID) | ORAL | Status: DC
  Administered 2016-11-25: 2 mg via ORAL
  Filled 2016-11-25: qty 2

## 2016-11-25 MED ORDER — VITAMIN B-1 100 MG PO TABS
100.0000 mg | ORAL_TABLET | Freq: Every day | ORAL | Status: DC
Start: 2016-11-25 — End: 2016-11-26
  Administered 2016-11-26: 100 mg via ORAL
  Filled 2016-11-25: qty 1

## 2016-11-25 MED ORDER — METOPROLOL TARTRATE 5 MG/5ML IV SOLN
5.0000 mg | Freq: Four times a day (QID) | INTRAVENOUS | Status: DC
  Administered 2016-11-25 – 2016-11-26 (×3): 5 mg via INTRAVENOUS
  Filled 2016-11-25 (×3): qty 5

## 2016-11-25 MED ORDER — LORAZEPAM 1 MG PO TABS: 0.0000 mg | ORAL_TABLET | Freq: Two times a day (BID) | ORAL | Status: DC

## 2016-11-25 MED ORDER — ACETAMINOPHEN 325 MG PO TABS: 650.0000 mg | ORAL_TABLET | Freq: Four times a day (QID) | ORAL | Status: DC | PRN

## 2016-11-25 MED ORDER — FOLIC ACID 5 MG/ML IJ SOLN
1.0000 mg | Freq: Every day | INTRAMUSCULAR | Status: DC
  Administered 2016-11-26: 1 mg via INTRAVENOUS
  Filled 2016-11-25 (×3): qty 0.2

## 2016-11-25 MED ORDER — ONDANSETRON HCL 4 MG/2ML IJ SOLN
4.0000 mg | Freq: Four times a day (QID) | INTRAMUSCULAR | Status: DC
  Administered 2016-11-26 (×3): 4 mg via INTRAVENOUS
  Filled 2016-11-25 (×4): qty 2

## 2016-11-25 MED ORDER — LORAZEPAM 2 MG/ML IJ SOLN
2.0000 mg | Freq: Once | INTRAMUSCULAR | Status: AC
  Administered 2016-11-25: 2 mg via INTRAVENOUS
  Filled 2016-11-25: qty 1

## 2016-11-25 MED ORDER — LORAZEPAM 2 MG/ML IJ SOLN
2.0000 mg | INTRAMUSCULAR | Status: DC | PRN
  Administered 2016-11-25: 2 mg via INTRAVENOUS
  Filled 2016-11-25: qty 1

## 2016-11-25 MED ORDER — ACETAMINOPHEN 650 MG RE SUPP: 650.0000 mg | Freq: Four times a day (QID) | RECTAL | Status: DC | PRN

## 2016-11-25 MED ORDER — DEXTROSE-NACL 5-0.9 % IV SOLN
INTRAVENOUS | Status: DC
Start: 2016-11-25 — End: 2016-11-26
  Administered 2016-11-25 – 2016-11-26 (×2): via INTRAVENOUS

## 2016-11-25 MED ORDER — SODIUM CHLORIDE 0.9% FLUSH
3.0000 mL | Freq: Two times a day (BID) | INTRAVENOUS | Status: DC
  Administered 2016-11-25 – 2016-11-26 (×3): 3 mL via INTRAVENOUS

## 2016-11-25 MED ORDER — THIAMINE HCL 100 MG/ML IJ SOLN
100.0000 mg | Freq: Every day | INTRAMUSCULAR | Status: DC
  Administered 2016-11-25: 100 mg via INTRAVENOUS
  Filled 2016-11-25: qty 2

## 2016-11-25 MED ORDER — PNEUMOCOCCAL VAC POLYVALENT 25 MCG/0.5ML IJ INJ
0.5000 mL | INJECTION | INTRAMUSCULAR | Status: DC
  Filled 2016-11-25: qty 0.5

## 2016-11-25 MED ORDER — THIAMINE HCL 100 MG/ML IJ SOLN
100.0000 mg | Freq: Every day | INTRAMUSCULAR | Status: DC
  Administered 2016-11-25: 100 mg via INTRAVENOUS
  Filled 2016-11-25 (×2): qty 2

## 2016-11-25 MED ORDER — INFLUENZA VAC SPLIT QUAD 0.5 ML IM SUSY
0.5000 mL | PREFILLED_SYRINGE | INTRAMUSCULAR | Status: AC
  Administered 2016-11-26: 0.5 mL via INTRAMUSCULAR
  Filled 2016-11-25: qty 0.5

## 2016-11-25 MED ORDER — PROMETHAZINE HCL 25 MG/ML IJ SOLN
25.0000 mg | Freq: Four times a day (QID) | INTRAMUSCULAR | Status: DC | PRN
  Administered 2016-11-25: 25 mg via INTRAVENOUS
  Filled 2016-11-25: qty 1

## 2016-11-25 MED ORDER — ENOXAPARIN SODIUM 40 MG/0.4ML ~~LOC~~ SOLN
40.0000 mg | SUBCUTANEOUS | Status: DC
  Administered 2016-11-25: 40 mg via SUBCUTANEOUS
  Filled 2016-11-25: qty 0.4

## 2016-11-25 MED ORDER — SODIUM CHLORIDE 0.9 % IV BOLUS (SEPSIS)
1000.0000 mL | Freq: Once | INTRAVENOUS | Status: AC
Start: 2016-11-25 — End: 2016-11-25
  Administered 2016-11-25: 1000 mL via INTRAVENOUS

## 2016-11-25 MED ORDER — PANTOPRAZOLE SODIUM 40 MG IV SOLR
40.0000 mg | Freq: Two times a day (BID) | INTRAVENOUS | Status: DC
  Administered 2016-11-25 – 2016-11-26 (×3): 40 mg via INTRAVENOUS
  Filled 2016-11-25 (×3): qty 40

## 2016-11-25 NOTE — ED Notes (Signed)
ED Provider at bedside. 

## 2016-11-25 NOTE — ED Notes (Signed)
Patient is resting comfortably. Family at bedside.

## 2016-11-25 NOTE — ED Provider Notes (Signed)
[facility] DEPT Provider Note   CSN: [id number] Arrival date & time: 11/25/16  1041     History   Chief Complaint Chief Complaint  Patient presents with  . Alcohol Problem    HPI Brandi Kirk is a 31 y.o. female.  HPI Patient presents emergency department complaining of alcohol withdrawal symptoms.  Her last drink was last night.  She's been tapering over the past 48 hours.  She drinks vodka daily.  She feels anxious and shaky.  Denies chest pain.  No abdominal pain.  Denies nausea vomiting.  She is here and supported by a family member.  Her symptoms are moderate to severe in severity   Past Medical History:  Diagnosis Date  . Depression   . HPV in female   . Migraine   . PCOS (polycystic ovarian syndrome)   . Tension headache   . Vocal cord dysfunction     Patient Active Problem List   Diagnosis Date Noted  . HYPERTENSION 08/21/2008  . OTHER DISEASES OF VOCAL CORDS 08/21/2008  . ASTHMA NOS W/O STATUS ASTHMATICUS 08/21/2008    Past Surgical History:  Procedure Laterality Date  . WISDOM TOOTH EXTRACTION      OB History    No data available       Home Medications    Prior to Admission medications   Medication Sig Start Date End Date Taking? Authorizing Provider  diclofenac (VOLTAREN) 50 MG EC tablet Take 1 tablet (50 mg total) by mouth 2 (two) times daily. 03/04/16   Fransico Meadow, PA-C  levonorgestrel (MIRENA) 20 MCG/24HR IUD 1 each by Intrauterine route once.    Historical Provider, MD  methocarbamol (ROBAXIN) 500 MG tablet Take 1 tablet (500 mg total) by mouth 2 (two) times daily. 03/04/16   Fransico Meadow, PA-C  pantoprazole (PROTONIX) 40 MG tablet Take 40 mg by mouth daily.    Historical Provider, MD  phentermine 37.5 MG capsule Take 37.5 mg by mouth every morning.    Historical Provider, MD  propranolol (INDERAL) 40 MG tablet Take 80 mg by mouth daily.    Historical Provider, MD    Family History Family History  Problem Relation Age of Onset  .  Diabetes Mother   . Stroke Mother   . Hypertension Mother   . Heart murmur Mother   . Rheum arthritis Sister   . Heart murmur Sister   . Heart murmur Brother     Social History Social History  Substance Use Topics  . Smoking status: Current Some Day Smoker    Types: Cigarettes  . Smokeless tobacco: Never Used  . Alcohol use Yes     Comment: Pt reports restocking her liquor cabinet each week.  1 gallon of vodka in a weekend     Allergies   Ciprofloxacin and Septra [sulfamethoxazole-trimethoprim]   Review of Systems Review of Systems  All other systems reviewed and are negative.    Physical Exam Updated Vital Signs BP 130/72 (BP Location: Left Arm)   Pulse (!) 132   Temp 98.3 F (36.8 C) (Oral)   Resp (!) 30   SpO2 96%   Physical Exam  Constitutional: She is oriented to person, place, and time. She appears well-developed and well-nourished. No distress.  HENT:  Head: Normocephalic and atraumatic.  Eyes: EOM are normal.  Neck: Normal range of motion.  Cardiovascular: Regular rhythm and normal heart sounds.   Tachycardia  Pulmonary/Chest: Effort normal and breath sounds normal.  Abdominal: Soft. She exhibits no distension.  There is no tenderness.  Musculoskeletal: Normal range of motion.  Neurological: She is alert and oriented to person, place, and time.  Skin: Skin is warm and dry.  Psychiatric:  Anxious appearing  Nursing note and vitals reviewed.    ED Treatments / Results  Labs (all labs ordered are listed, but only abnormal results are displayed) Labs Reviewed  COMPREHENSIVE METABOLIC PANEL  ETHANOL  CBC  RAPID URINE DRUG SCREEN, HOSP PERFORMED  LIPASE, BLOOD  I-STAT BETA HCG BLOOD, ED ([facility], [facility], [facility] ONLY)    EKG  EKG Interpretation None       Radiology No results found.  Procedures Procedures (including critical care time)  Medications Ordered in ED Medications  sodium chloride 0.9 % bolus 1,000 mL (not administered)  LORazepam  (ATIVAN) injection 2 mg (not administered)  LORazepam (ATIVAN) tablet 0-4 mg (not administered)    Followed by  LORazepam (ATIVAN) tablet 0-4 mg (not administered)  thiamine (VITAMIN B-1) tablet 100 mg (not administered)    Or  thiamine (B-1) injection 100 mg (not administered)     Initial Impression / Assessment and Plan / ED Course  I have reviewed the triage vital signs and the nursing notes.  Pertinent labs & imaging results that were available during my care of the patient were reviewed by me and considered in my medical decision making (see chart for details).     Alcohol withdrawal.  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

## 2016-11-25 NOTE — ED Notes (Signed)
Report attempted 

## 2016-11-25 NOTE — ED Notes (Signed)
Patient is resting comfortably. 

## 2016-11-25 NOTE — ED Notes (Signed)
Dinner tray ordered.

## 2016-11-25 NOTE — ED Notes (Signed)
Patient transported to Ultrasound 

## 2016-11-25 NOTE — ED Notes (Signed)
Pt reports usually drinking 1 gallon of vodka in a weekend and that she thinks she's saw black spots in her vision last night.

## 2016-11-25 NOTE — ED Notes (Signed)
Padded the siderail with blankets

## 2016-11-25 NOTE — ED Triage Notes (Signed)
Pt from home with c/o ETOH withdrawal.  Last drink was last night.  Pt states she drinks 4-5 cups of straight liquor a day.  Pt shaking with dry heaves in triage with CP.  A&O.

## 2016-11-25 NOTE — ED Notes (Signed)
Dr Marily Memos at bedside during CIWA assessment; reviewed POC with a patient and family.
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
## 2016-11-26 DIAGNOSIS — I10 Essential (primary) hypertension: Secondary | ICD-10-CM | POA: Diagnosis not present

## 2016-11-26 DIAGNOSIS — F10230 Alcohol dependence with withdrawal, uncomplicated: Secondary | ICD-10-CM

## 2016-11-26 LAB — CBC
HCT: 34.8 % — ABNORMAL LOW (ref 36.0–46.0)
HEMOGLOBIN: 11.2 g/dL — AB (ref 12.0–15.0)
MCH: 30.9 pg (ref 26.0–34.0)
MCHC: 32.2 g/dL (ref 30.0–36.0)
MCV: 95.9 fL (ref 78.0–100.0)
PLATELETS: 200 10*3/uL (ref 150–400)
RBC: 3.63 MIL/uL — ABNORMAL LOW (ref 3.87–5.11)
RDW: 14.3 % (ref 11.5–15.5)
WBC: 5.9 10*3/uL (ref 4.0–10.5)

## 2016-11-26 LAB — COMPREHENSIVE METABOLIC PANEL
ALBUMIN: 3.1 g/dL — AB (ref 3.5–5.0)
ALK PHOS: 50 U/L (ref 38–126)
ALT: 49 U/L (ref 14–54)
AST: 63 U/L — AB (ref 15–41)
Anion gap: 5 (ref 5–15)
BUN: 9 mg/dL (ref 6–20)
CALCIUM: 8.1 mg/dL — AB (ref 8.9–10.3)
CO2: 25 mmol/L (ref 22–32)
CREATININE: 0.82 mg/dL (ref 0.44–1.00)
Chloride: 106 mmol/L (ref 101–111)
GFR calc Af Amer: >60 mL/min (ref >60)
GFR calc non Af Amer: >60 mL/min (ref >60)
GLUCOSE: 100 mg/dL — AB (ref 65–99)
Potassium: 3.6 mmol/L (ref 3.5–5.1)
SODIUM: 136 mmol/L (ref 135–145)
Total Bilirubin: 1 mg/dL (ref 0.3–1.2)
Total Protein: 5.7 g/dL — ABNORMAL LOW (ref 6.5–8.1)

## 2016-11-26 LAB — HEMOGLOBIN A1C
Hgb A1c MFr Bld: 5.2 % (ref 4.8–5.6)
Mean Plasma Glucose: 103 mg/dL

## 2016-11-26 LAB — HIV ANTIBODY (ROUTINE TESTING W REFLEX): HIV Screen 4th Generation wRfx: NONREACTIVE

## 2016-11-26 MED ORDER — LABETALOL HCL 100 MG PO TABS
100.0000 mg | ORAL_TABLET | Freq: Two times a day (BID) | ORAL | Status: DC
  Administered 2016-11-26: 100 mg via ORAL
  Filled 2016-11-26: qty 1

## 2016-11-26 MED ORDER — THIAMINE HCL 100 MG PO TABS: 100.0000 mg | ORAL_TABLET | Freq: Every day | ORAL | 0 refills | Status: DC

## 2016-11-26 MED ORDER — FOLIC ACID 1 MG PO TABS: 1.0000 mg | ORAL_TABLET | Freq: Every day | ORAL | 0 refills | Status: DC

## 2016-11-26 MED ORDER — K PHOS MONO-SOD PHOS DI & MONO 155-852-130 MG PO TABS
250.0000 mg | ORAL_TABLET | Freq: Once | ORAL | Status: AC
  Administered 2016-11-26: 250 mg via ORAL
  Filled 2016-11-26 (×2): qty 1

## 2016-11-26 MED ORDER — LABETALOL HCL 100 MG PO TABS: 100.0000 mg | ORAL_TABLET | Freq: Two times a day (BID) | ORAL | 0 refills | Status: DC

## 2016-11-26 MED ORDER — ENOXAPARIN SODIUM 60 MG/0.6ML ~~LOC~~ SOLN: 50.0000 mg | SUBCUTANEOUS | Status: DC

## 2016-11-26 NOTE — Care Management Utilization Note (Signed)
Informed patient that she does not meet at inpt, but as an observation status.  Explained that as an observation status it is considered OP and her cost would be more. Also informed that unless she had more n/v they may not pay for tonight's stay.  She states she understands, thanked me for letting her know.  States she has tolerated her regular lunch - no nausea or vomiting and would like to go home.  Updated nures, Tata who states she will call Dr. Charlies Silvers.    Luz Lex, RNBSN  [phone]

## 2016-11-26 NOTE — Discharge Summary (Signed)
Physician Discharge Summary  Brandi Kirk RFF:[id number] DOB: 1986/04/26 DOA: 11/25/2016  PCP: PROVIDER NOT IN SYSTEM  Admit date: 11/25/2016 Discharge date: 11/26/2016  Recommendations for Outpatient Follow-up:  Continue labetalol 100 mg twice a day for blood pressure control Continue thiamine and folic acid Continue multivitamin - may take OTC  Discharge Diagnoses:  Principal Problem:   Alcohol withdrawal ([place]) Active Problems:   HTN (hypertension)   Vocal cord dysfunction   Asthma   Abnormal transaminases   Acute hyperglycemia    Discharge Condition: stable   Diet recommendation: as tolerated   History of present illness:  31 y.o.femalewith medical history significant for alcohol abuse, social tobacco abuse, obesity, vocal cord dysfunction with asthma who presented to ED with tremulousness, diarrhea, vomiting with occasional blood with vomiting over past few days prior to this admission. She reported trying to detox from alcohol. She usually drinks vodka daily or 1-2 bottles of wine a day. She was hemodynamically stable on admission. Blood work showed WBC count 11.2 otherwise unremarkable. She was given normal saline in ED, ativan 2 mg IV once, PO ativan 2 mg once, thiamine and zofran. She was admitted for acute alcohol withdrawal management.   Hospital Course:    Assessment & Plan:   Principal Problem:   Alcohol withdrawal ([place]) - Alcohol level WNL on the admission - Continue thiamine and folic acid - No reports of withdrawals  - Transfer to telemetry floor  Active Problems:   Leukocytosis - Likely reactive - No evidence of acute infectious process    Hematemesis / Acute blood loss anemia  - Possible mallory weiss tear from alcohol abuse and withdrawal - Stable at this point and tolerates clear liquid diet - We'll advance to regular diet today    Hypophosphatemia - Likely from alcohol abuse - Supplemented today    Essential hypertension - She's not on  antihypertensives at home but blood pressure 130/96 today  - She is on metoprolol 5 mg IV every 6 hours, since she is eating we will change this to labetalol 100 mg twice daily    DVT prophylaxis: Lovenox subQ Code Status: full code  Family Communication: no family at the bedside this am    Consultants:   None   Procedures:   None   Antimicrobials:   None    Signed:  Leisa Lenz, MD  [facility] 11/26/2016, 4:40 PM  Pager #: [phone]  Time spent in minutes: less than 30 minutes    Discharge Exam: Vitals:   11/26/16 1214 11/26/16 1245  BP: 122/84   Pulse:  98  Resp:    Temp: 98.4 F (36.9 C)    Vitals:   11/26/16 0329 11/26/16 0856 11/26/16 1214 11/26/16 1245  BP: (!) 130/96 112/79 122/84   Pulse: 83 78  98  Resp: 15 18    Temp: 98.6 F (37 C) 97.9 F (36.6 C) 98.4 F (36.9 C)   TempSrc: Oral Axillary Oral   SpO2: 98% 98%    Weight:      Height:        General: Pt is alert, follows commands appropriately, not in acute distress Cardiovascular: Regular rate and rhythm, S1/S2 +, no murmurs Respiratory: Clear to auscultation bilaterally, no wheezing, no crackles, no rhonchi Abdominal: Soft, non tender, non distended, bowel sounds +, no guarding Extremities: no edema, no cyanosis, pulses palpable bilaterally DP and PT Neuro: Grossly nonfocal  Discharge Instructions  Discharge Instructions    Call MD for:  persistant nausea and vomiting  Complete by:  As directed    Call MD for:  redness, tenderness, or signs of infection (pain, swelling, redness, odor or green/yellow discharge around incision site)    Complete by:  As directed    Call MD for:  severe uncontrolled pain    Complete by:  As directed    Diet - low sodium heart healthy    Complete by:  As directed    Discharge instructions    Complete by:  As directed    Continue labetalol 100 mg twice a day for blood pressure control Continue thiamine and folic acid Continue  multivitamin   Increase activity slowly    Complete by:  As directed      Allergies as of 11/26/2016      Reactions   Ciprofloxacin Other (See Comments)   intolerance   Septra [sulfamethoxazole-trimethoprim] Other (See Comments)   Thrush      Medication List    STOP taking these medications   diclofenac 50 MG EC tablet Commonly known as:  VOLTAREN   methocarbamol 500 MG tablet Commonly known as:  ROBAXIN     TAKE these medications   aspirin 325 MG tablet Take 325 mg by mouth every 6 (six) hours as needed for mild pain.   DEXILANT 60 MG capsule Generic drug:  dexlansoprazole Take 60 mg by mouth daily.   folic acid 1 MG tablet Commonly known as:  FOLVITE Take 1 tablet (1 mg total) by mouth daily.   labetalol 100 MG tablet Commonly known as:  NORMODYNE Take 1 tablet (100 mg total) by mouth 2 (two) times daily.   levonorgestrel 20 MCG/24HR IUD Commonly known as:  MIRENA 1 each by Intrauterine route once.   thiamine 100 MG tablet Take 1 tablet (100 mg total) by mouth daily. Start taking on:  11/27/2016         The results of significant diagnostics from this hospitalization (including imaging, microbiology, ancillary and laboratory) are listed below for reference.    Significant Diagnostic Studies: US Abdomen Complete  Result Date: 11/25/2016 CLINICAL DATA:  Cirrhosis. EXAM: ABDOMEN ULTRASOUND COMPLETE COMPARISON:  CT scan of November 02, 2008. FINDINGS: Gallbladder: No gallstones or wall thickening visualized. No sonographic Murphy sign noted by sonographer. Common bile duct: Diameter: 6 mm which is within normal limits. Liver: No focal lesion identified. Increased echogenicity of hepatic parenchyma is noted suggesting fatty infiltration or other diffuse hepatocellular disease. IVC: No abnormality visualized. Pancreas: Visualized portion unremarkable. Spleen: Size and appearance within normal limits. Right Kidney: Length: 12.1 cm. Echogenicity within normal limits. No  mass or hydronephrosis visualized. Left Kidney: Length: 12 cm. Echogenicity within normal limits. No mass or hydronephrosis visualized. Abdominal aorta: No aneurysm visualized. Other findings: None. IMPRESSION: Increased echogenicity of hepatic parenchyma is noted suggesting fatty infiltration or other diffuse hepatocellular disease. No other abnormality seen in the abdomen. Electronically Signed   By: Marijo Conception, M.D.   On: 11/25/2016 15:58    Microbiology: Recent Results (from the past 240 hour(s))  MRSA PCR Screening     Status: None   Collection Time: 11/25/16  6:36 PM  Result Value Ref Range Status   MRSA by PCR NEGATIVE NEGATIVE Final    Comment:        The GeneXpert MRSA Assay (FDA approved for NASAL specimens only), is one component of a comprehensive MRSA colonization surveillance program. It is not intended to diagnose MRSA infection nor to guide or monitor treatment for MRSA infections.  Labs: Basic Metabolic Panel:  Recent Labs Lab 11/25/16 1100 11/25/16 1314 11/26/16 0425  NA 138  --  136  K 4.1  --  3.6  CL 98*  --  106  CO2 19*  --  25  GLUCOSE 130*  --  100*  BUN 10  --  9  CREATININE 0.80  --  0.82  CALCIUM 9.1  --  8.1*  MG  --  2.1  --   PHOS  --  2.3*  --    Liver Function Tests:  Recent Labs Lab 11/25/16 1100 11/26/16 0425  AST 77* 63*  ALT 65* 49  ALKPHOS 68 50  BILITOT 1.5* 1.0  PROT 8.2* 5.7*  ALBUMIN 4.6 3.1*    Recent Labs Lab 11/25/16 1100  LIPASE 18   No results for input(s): AMMONIA in the last 168 hours. CBC:  Recent Labs Lab 11/25/16 1100 11/26/16 0425  WBC 11.2* 5.9  HGB 13.7 11.2*  HCT 40.9 34.8*  MCV 93.0 95.9  PLT 349 200   Cardiac Enzymes: No results for input(s): CKTOTAL, CKMB, CKMBINDEX, TROPONINI in the last 168 hours. BNP: BNP (last 3 results) No results for input(s): BNP in the last 8760 hours.  ProBNP (last 3 results) No results for input(s): PROBNP in the last 8760 hours.  CBG: No  results for input(s): GLUCAP in the last 168 hours.

## 2016-11-26 NOTE — Discharge Instructions (Signed)
Alcoholic Liver Disease The liver is an organ that converts food into energy, absorbs vitamins from food, removes toxins from the blood, and makes proteins. Alcoholic liver disease happens when the liver becomes damaged due to alcohol consumption and it stops working properly. What are the causes? This condition is caused by drinking too much alcohol over a number of years. What increases the risk? This condition is more likely to develop in:  Women.  People who have a family history of the disease.  People who have poor nutrition.  People who are obese. What are the signs or symptoms? Early symptoms of this condition include:  Fatigue.  Weakness.  Abdominal discomfort on the upper right side. Symptoms of moderate disease include:  Fever.  Yellow, pale, or darkening skin.  Abdominal pain.  Nausea and vomiting.  Weight loss.  Loss of appetite. Symptoms of advanced disease include:  Abdominal swelling.  Nosebleeds or bleeding gums.  Itchy skin.  Enlarged fingertips (clubbing).  Light-colored stools that smell very bad (steatorrhea).  Mood changes.  Feeling agitated.  Confusion.  Trouble concentrating. Some people do not have symptoms until the condition becomes severe. Symptoms are often worse after heavy drinking. How is this diagnosed? This condition is diagnosed with:  A physical exam.  Blood tests.  Taking a sample of liver tissue to examine under a microscope (liver biopsy). You may also have other tests, including:  X-rays.  Ultrasound. How is this treated? Treatment may include:  Stopping alcohol use to allow the liver to heal.  Joining a support group or meeting with a counselor.  Medicines to reduce inflammation. These may be recommended if the disease is severe.  A liver transplant. This is the only treatment if the disease is very severe.  Nutritional therapy. This may involve:  Taking vitamins.  Eating foods that are high in  thiamine, such as whole-wheat cereals, pork, and raw vegetables.  Eating foods that have a lot of folic acid, such as vegetables, fruits, meats, beans, nuts, and dairy foods.  Eating a diet that includes carbohydrate-rich foods, such as yogurt, beans, potatoes, and rice. Follow these instructions at home:  Do not drink alcohol.  Take medicines only as directed by your health care provider.  Take vitamins only as directed by your health care provider.  Follow any diet instructions that are given to you by your health care provider. Contact a health care provider if:  You have a fever.  You have shortness of breath or have difficulty breathing.  You have bright red blood in your stool, or you have black, tarry stools.  You are vomiting blood.  Your skin color becomes more yellow, pale, or dark.  You develop headaches.  You have trouble thinking.  You have problems balancing or walking. This information is not intended to replace advice given to you by your health care provider. Make sure you discuss any questions you have with your health care provider. Document Released: 10/06/2014 Document Revised: 02/21/2016 Document Reviewed: 08/17/2014 Elsevier Interactive Patient Education  2017 [employer].

## 2016-11-26 NOTE — Progress Notes (Signed)
Patient ID: Brandi Kirk, female   DOB: Mar 29, 1986, 29 y.o.   MRN: [id number]  PROGRESS NOTE    Brandi Kirk  RDE:[id number] DOB: 1986-02-07 DOA: 11/25/2016  PCP: PROVIDER NOT IN SYSTEM   Brief Narrative:  31 y.o. female with medical history significant for alcohol abuse, social tobacco abuse, obesity, vocal cord dysfunction with asthma who presented to ED with tremulousness, diarrhea, vomiting with occasional blood with vomiting over past few days prior to this admission. She reported trying to detox from alcohol. She usually drinks vodka daily or 1-2 bottles of wine a day. She was hemodynamically stable on admission. Blood work showed WBC count 11.2 otherwise unremarkable. She was given normal saline in ED, ativan 2 mg IV once, PO ativan 2 mg once, thiamine and zofran. She was admitted for acute alcohol withdrawal management.    Assessment & Plan:   Principal Problem:   Alcohol withdrawal ([place]) - Alcohol level WNL on the admission - CIWA protocol initiated - Continue thiamine and folic acid - No reports of withdrawals  - Transfer to telemetry floor  Active Problems:   Leukocytosis - Likely reactive - No evidence of acute infectious process - Repeat WBC count WNL    Hematemesis / Acute blood loss anemia  - Possible mallory weiss tear from alcohol abuse and withdrawal - Stable at this point and tolerates clear liquid diet - We'll advance to regular diet today    Hypophosphatemia - Likely from alcohol abuse - Supplemented today    Essential hypertension - She's not on antihypertensives at home but blood pressure 130/96 today  - She is on metoprolol 5 mg IV every 6 hours, since she is eating we will change this to labetalol 100 mg twice daily    DVT prophylaxis: Lovenox subQ Code Status: full code  Family Communication: no family at the bedside this am Disposition Plan: transfer to telemetry floor    Consultants:   None   Procedures:   None   Antimicrobials:    None    Subjective: No overnight events.  Objective: Vitals:   11/25/16 1809 11/25/16 1950 11/25/16 2327 11/26/16 0329  BP: 129/84 124/90 120/80 (!) 130/96  Pulse: 94 97 86 83  Resp: 19 20 11 15   Temp: 98.2 F (36.8 C) 98.5 F (36.9 C) 98.2 F (36.8 C) 98.6 F (37 C)  TempSrc: Oral Oral Oral Oral  SpO2: 100% 100% 95% 98%  Weight: 106.4 kg (234 lb 9.1 oz)     Height: 5' 4"  (1.626 m)       Intake/Output Summary (Last 24 hours) at 11/26/16 1031 Last data filed at 11/26/16 1000  Gross per 24 hour  Intake             2942 ml  Output                0 ml  Net             2942 ml   Filed Weights   11/25/16 1809  Weight: 106.4 kg (234 lb 9.1 oz)    Examination:  General exam: Appears calm and comfortable  Respiratory system: Clear to auscultation. Respiratory effort normal. Cardiovascular system: S1 & S2 heard, RRR. No JVD, murmurs, rubs, gallops or clicks. No pedal edema. Gastrointestinal system: Abdomen is nondistended, soft and nontender. No organomegaly or masses felt. Normal bowel sounds heard. Central nervous system: Alert and oriented. No focal neurological deficits. Extremities: Symmetric 5 x 5 power. Skin: No rashes, lesions or ulcers Psychiatry: Judgement  and insight appear normal. Mood & affect appropriate.   Data Reviewed: I have personally reviewed following labs and imaging studies  CBC:  Recent Labs Lab 11/25/16 1100 11/26/16 0425  WBC 11.2* 5.9  HGB 13.7 11.2*  HCT 40.9 34.8*  MCV 93.0 95.9  PLT 349 992   Basic Metabolic Panel:  Recent Labs Lab 11/25/16 1100 11/25/16 1314 11/26/16 0425  NA 138  --  136  K 4.1  --  3.6  CL 98*  --  106  CO2 19*  --  25  GLUCOSE 130*  --  100*  BUN 10  --  9  CREATININE 0.80  --  0.82  CALCIUM 9.1  --  8.1*  MG  --  2.1  --   PHOS  --  2.3*  --    GFR: Estimated Creatinine Clearance: 121.6 mL/min (by C-G formula based on SCr of 0.82 mg/dL). Liver Function Tests:  Recent Labs Lab  11/25/16 1100 11/26/16 0425  AST 77* 63*  ALT 65* 49  ALKPHOS 68 50  BILITOT 1.5* 1.0  PROT 8.2* 5.7*  ALBUMIN 4.6 3.1*    Recent Labs Lab 11/25/16 1100  LIPASE 18   No results for input(s): AMMONIA in the last 168 hours. Coagulation Profile: No results for input(s): INR, PROTIME in the last 168 hours. Cardiac Enzymes: No results for input(s): CKTOTAL, CKMB, CKMBINDEX, TROPONINI in the last 168 hours. BNP (last 3 results) No results for input(s): PROBNP in the last 8760 hours. HbA1C:  Recent Labs  11/25/16 1100  HGBA1C 5.2   CBG: No results for input(s): GLUCAP in the last 168 hours. Lipid Profile: No results for input(s): CHOL, HDL, LDLCALC, TRIG, CHOLHDL, LDLDIRECT in the last 72 hours. Thyroid Function Tests: No results for input(s): TSH, T4TOTAL, FREET4, T3FREE, THYROIDAB in the last 72 hours. Anemia Panel: No results for input(s): VITAMINB12, FOLATE, FERRITIN, TIBC, IRON, RETICCTPCT in the last 72 hours. Urine analysis:    Component Value Date/Time   COLORURINE YELLOW 11/02/2008 1936   APPEARANCEUR CLEAR 11/02/2008 1936   LABSPEC 1.010 11/02/2008 1936   PHURINE 7.0 11/02/2008 1936   GLUCOSEU NEGATIVE 11/02/2008 1936   HGBUR NEGATIVE 11/02/2008 1936   BILIRUBINUR negative 03/04/2016 1133   KETONESUR negative 03/04/2016 1133   KETONESUR NEGATIVE 11/02/2008 1936   PROTEINUR negative 03/04/2016 1133   PROTEINUR NEGATIVE 11/02/2008 1936   UROBILINOGEN 0.2 03/04/2016 1133   UROBILINOGEN 0.2 11/02/2008 1936   NITRITE Negative 03/04/2016 1133   NITRITE NEGATIVE 11/02/2008 1936   LEUKOCYTESUR Negative 03/04/2016 1133   Sepsis Labs: @LABRCNTIP (procalcitonin:4,lacticidven:4)    Recent Results (from the past 240 hour(s))  MRSA PCR Screening     Status: None   Collection Time: 11/25/16  6:36 PM  Result Value Ref Range Status   MRSA by PCR NEGATIVE NEGATIVE Final      Radiology Studies: US Abdomen Complete Result Date: 11/25/2016  Increased echogenicity  of hepatic parenchyma is noted suggesting fatty infiltration or other diffuse hepatocellular disease. No other abnormality seen in the abdomen.    Scheduled Meds: . enoxaparin (LOVENOX) injection  50 mg Subcutaneous Q24H  . folic acid  1 mg Intravenous Daily  . metoprolol  5 mg Intravenous Q6H  . ondansetron (ZOFRAN) IV  4 mg Intravenous Q6H  . pantoprazole (PROTONIX) IV  40 mg Intravenous Q12H  . thiamine  100 mg Oral Daily   Or  . thiamine  100 mg Intravenous Daily   Continuous Infusions: . dextrose 5 % and 0.9%  NaCl 75 mL/hr at 11/26/16 0647     LOS: 1 day    Time spent: 25 minutes  Greater than 50% of the time spent on counseling and coordinating the care.   Leisa Lenz, MD [facility] Pager [phone]  If 7PM-7AM, please contact night-coverage [email or web address] Password [facility] 11/26/2016, 10:31 AM

## 2016-11-27 NOTE — Progress Notes (Signed)
Husband came to pick up patient.  Discharged to home in stable condition.  Discharge instructions given. Patient verbalized understanding.

## 2016-12-12 DIAGNOSIS — I10 Essential (primary) hypertension: Secondary | ICD-10-CM | POA: Diagnosis not present

## 2017-01-25 ENCOUNTER — Encounter (HOSPITAL_BASED_OUTPATIENT_CLINIC_OR_DEPARTMENT_OTHER): Payer: Self-pay

## 2017-01-25 ENCOUNTER — Emergency Department (HOSPITAL_BASED_OUTPATIENT_CLINIC_OR_DEPARTMENT_OTHER)
Admission: EM | Admit: 2017-01-25 | Discharge: 2017-01-25 | Disposition: A | Discharge status: Home / Self Care | Payer: 59 | Attending: Emergency Medicine | Admitting: Emergency Medicine

## 2017-01-25 DIAGNOSIS — K2920 Alcoholic gastritis without bleeding: Secondary | ICD-10-CM | POA: Insufficient documentation

## 2017-01-25 DIAGNOSIS — Z79899 Other long term (current) drug therapy: Secondary | ICD-10-CM | POA: Insufficient documentation

## 2017-01-25 DIAGNOSIS — J45909 Unspecified asthma, uncomplicated: Secondary | ICD-10-CM | POA: Diagnosis not present

## 2017-01-25 DIAGNOSIS — F10930 Alcohol use, unspecified with withdrawal, uncomplicated: Secondary | ICD-10-CM

## 2017-01-25 DIAGNOSIS — Z7982 Long term (current) use of aspirin: Secondary | ICD-10-CM | POA: Diagnosis not present

## 2017-01-25 DIAGNOSIS — I10 Essential (primary) hypertension: Secondary | ICD-10-CM | POA: Diagnosis not present

## 2017-01-25 DIAGNOSIS — F10230 Alcohol dependence with withdrawal, uncomplicated: Secondary | ICD-10-CM

## 2017-01-25 DIAGNOSIS — F1010 Alcohol abuse, uncomplicated: Secondary | ICD-10-CM

## 2017-01-25 DIAGNOSIS — F10120 Alcohol abuse with intoxication, uncomplicated: Secondary | ICD-10-CM | POA: Diagnosis not present

## 2017-01-25 DIAGNOSIS — F17210 Nicotine dependence, cigarettes, uncomplicated: Secondary | ICD-10-CM | POA: Insufficient documentation

## 2017-01-25 HISTORY — DX: Essential (primary) hypertension: I10

## 2017-01-25 LAB — COMPREHENSIVE METABOLIC PANEL
ALBUMIN: 4.8 g/dL (ref 3.5–5.0)
ALT: 134 U/L — ABNORMAL HIGH (ref 14–54)
AST: 67 U/L — AB (ref 15–41)
Alkaline Phosphatase: 79 U/L (ref 38–126)
Anion gap: 14 (ref 5–15)
BUN: 12 mg/dL (ref 6–20)
CHLORIDE: 100 mmol/L — AB (ref 101–111)
CO2: 24 mmol/L (ref 22–32)
Calcium: 9.2 mg/dL (ref 8.9–10.3)
Creatinine, Ser: 0.81 mg/dL (ref 0.44–1.00)
GFR calc Af Amer: >60 mL/min (ref >60)
GFR calc non Af Amer: >60 mL/min (ref >60)
GLUCOSE: 112 mg/dL — AB (ref 65–99)
POTASSIUM: 4 mmol/L (ref 3.5–5.1)
SODIUM: 138 mmol/L (ref 135–145)
Total Bilirubin: 1.2 mg/dL (ref 0.3–1.2)
Total Protein: 8.6 g/dL — ABNORMAL HIGH (ref 6.5–8.1)

## 2017-01-25 LAB — RAPID URINE DRUG SCREEN, HOSP PERFORMED
Amphetamines: NOT DETECTED
BARBITURATES: NOT DETECTED
Benzodiazepines: NOT DETECTED
COCAINE: NOT DETECTED
Opiates: NOT DETECTED
Tetrahydrocannabinol: NOT DETECTED

## 2017-01-25 LAB — CBC WITH DIFFERENTIAL/PLATELET
BASOS ABS: 0 10*3/uL (ref 0.0–0.1)
BASOS PCT: 0 %
Eosinophils Absolute: 0.1 10*3/uL (ref 0.0–0.7)
Eosinophils Relative: 1 %
HCT: 39.4 % (ref 36.0–46.0)
Hemoglobin: 13.7 g/dL (ref 12.0–15.0)
Lymphocytes Relative: 21 %
Lymphs Abs: 2 10*3/uL (ref 0.7–4.0)
MCH: 31.5 pg (ref 26.0–34.0)
MCHC: 34.8 g/dL (ref 30.0–36.0)
MCV: 90.6 fL (ref 78.0–100.0)
MONO ABS: 0.6 10*3/uL (ref 0.1–1.0)
Monocytes Relative: 6 %
Neutro Abs: 6.7 10*3/uL (ref 1.7–7.7)
Neutrophils Relative %: 72 %
Platelets: 356 10*3/uL (ref 150–400)
RBC: 4.35 MIL/uL (ref 3.87–5.11)
RDW: 13 % (ref 11.5–15.5)
WBC: 9.4 10*3/uL (ref 4.0–10.5)

## 2017-01-25 LAB — URINALYSIS, ROUTINE W REFLEX MICROSCOPIC
Bilirubin Urine: NEGATIVE
GLUCOSE, UA: NEGATIVE mg/dL
Ketones, ur: 15 mg/dL — AB
LEUKOCYTES UA: NEGATIVE
Nitrite: NEGATIVE
PROTEIN: NEGATIVE mg/dL
SPECIFIC GRAVITY, URINE: 1.024 (ref 1.005–1.030)
pH: 6 (ref 5.0–8.0)

## 2017-01-25 LAB — URINALYSIS, MICROSCOPIC (REFLEX): WBC, UA: NONE SEEN WBC/hpf (ref 0–5)

## 2017-01-25 LAB — PREGNANCY, URINE: Preg Test, Ur: NEGATIVE

## 2017-01-25 LAB — ETHANOL

## 2017-01-25 LAB — LIPASE, BLOOD: Lipase: 16 U/L (ref 11–51)

## 2017-01-25 MED ORDER — LORAZEPAM 2 MG/ML IJ SOLN
1.0000 mg | Freq: Once | INTRAMUSCULAR | Status: AC
  Administered 2017-01-25: 1 mg via INTRAVENOUS
  Filled 2017-01-25: qty 1

## 2017-01-25 MED ORDER — SODIUM CHLORIDE 0.9 % IV BOLUS (SEPSIS)
1000.0000 mL | Freq: Once | INTRAVENOUS | Status: AC
  Administered 2017-01-25: 1000 mL via INTRAVENOUS

## 2017-01-25 MED ORDER — METOCLOPRAMIDE HCL 5 MG/ML IJ SOLN
10.0000 mg | Freq: Once | INTRAMUSCULAR | Status: AC
  Administered 2017-01-25: 10 mg via INTRAVENOUS
  Filled 2017-01-25: qty 2

## 2017-01-25 MED ORDER — ONDANSETRON HCL 4 MG/2ML IJ SOLN
4.0000 mg | Freq: Once | INTRAMUSCULAR | Status: AC
  Administered 2017-01-25: 4 mg via INTRAVENOUS
  Filled 2017-01-25: qty 2

## 2017-01-25 MED ORDER — CHLORDIAZEPOXIDE HCL 25 MG PO CAPS: ORAL_CAPSULE | ORAL | 0 refills | Status: DC

## 2017-01-25 MED ORDER — PANTOPRAZOLE SODIUM 40 MG IV SOLR
40.0000 mg | Freq: Once | INTRAVENOUS | Status: AC
  Administered 2017-01-25: 40 mg via INTRAVENOUS
  Filled 2017-01-25: qty 40

## 2017-01-25 MED ORDER — ONDANSETRON 4 MG PO TBDP: 4.0000 mg | ORAL_TABLET | Freq: Three times a day (TID) | ORAL | 0 refills | Status: DC | PRN

## 2017-01-25 NOTE — ED Notes (Addendum)
Drank last night bourbon and coke and then a beer this am. States wants help to stop drinking. Denies HI/SI. Husband at bedside . Husband states," We have been through this many times"

## 2017-01-25 NOTE — ED Notes (Signed)
Given gingerale for a fluid challenge

## 2017-01-25 NOTE — ED Triage Notes (Signed)
Pt presents to the ED for chest pain, nausea, vomiting, tremors and detox from Alcohol. Reports visual hallucinations. Last drink beer this morning, otherwise last drink was yesterday. She is accompanied by her husband.

## 2017-01-25 NOTE — ED Notes (Signed)
Family at bedside. 

## 2017-01-25 NOTE — ED Notes (Signed)
Patient is resting comfortably. 

## 2017-01-25 NOTE — ED Provider Notes (Signed)
[facility] DEPT [facility] Provider Note   CSN: [id number] Arrival date & time: 01/25/17  1415   By signing my name below, I, Neta Mends, attest that this documentation has been prepared under the direction and in the presence of Blanchie Dessert, MD . Electronically Signed: Neta Mends, ED Scribe. 01/25/2017. 3:17 PM.   History   Chief Complaint Chief Complaint  Patient presents with  . Withdrawal   The history is provided by the patient. No language interpreter was used.   HPI Comments:  Brandi Kirk is a 31 y.o. female who presents to the Emergency Department complaining of ongoing EtOH withdrawal symptoms x 1 week. Pt complains of associated chest pain, headaches, nausea, vomiting, abdominal pain, visual hallucinations, and tremors. Pt reports that her last drink was 1 beer this AM and she vomited immediately after. Pt reports that normally she has 3-4 "tumblers", but she has drank more over the past week. She reports that the last time she ate without vomiting was last night, and her vomit was light brown in color. She has vomited several times over the past week. Husband states that today she began having visual hallucinations of people/objects. She has an IUD and does not have periods. She reports trying marijuana with no relief for symptoms. No other alleviating factors noted. Denies fever, SI/HI.   Past Medical History:  Diagnosis Date  . Depression   . HPV in female   . Hypertension   . Migraine   . PCOS (polycystic ovarian syndrome)   . Tension headache   . Vocal cord dysfunction     Patient Active Problem List   Diagnosis Date Noted  . Alcohol withdrawal ([place]) 11/25/2016  . Asthma 11/25/2016  . Abnormal transaminases 11/25/2016  . Acute hyperglycemia 11/25/2016  . 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 HTN (hypertension) 08/21/2008  . Vocal cord dysfunction 08/21/2008  . ASTHMA NOS W/O STATUS ASTHMATICUS 08/21/2008    Past Surgical History:  Procedure  Laterality Date  . WISDOM TOOTH EXTRACTION      OB History    No data available       Home Medications    Prior to Admission medications   Medication Sig Start Date End Date Taking? Authorizing Provider  aspirin 325 MG tablet Take 325 mg by mouth every 6 (six) hours as needed for mild pain.    Historical Provider, MD  DEXILANT 60 MG capsule Take 60 mg by mouth daily. 10/21/16   Historical Provider, MD  folic acid (FOLVITE) 1 MG tablet Take 1 tablet (1 mg total) by mouth daily. 11/26/16   Robbie Lis, MD  labetalol (NORMODYNE) 100 MG tablet Take 1 tablet (100 mg total) by mouth 2 (two) times daily. 11/26/16   Robbie Lis, MD  levonorgestrel (MIRENA) 20 MCG/24HR IUD 1 each by Intrauterine route once.    Historical Provider, MD  thiamine 100 MG tablet Take 1 tablet (100 mg total) by mouth daily. 11/27/16   Robbie Lis, MD    Family History Family History  Problem Relation Age of Onset  . Diabetes Mother   . Stroke Mother   . Hypertension Mother   . Heart murmur Mother   . Rheum arthritis Sister   . Heart murmur Sister   . Heart murmur Brother     Social History Social History  Substance Use Topics  . Smoking status: Current Some Day Smoker    Types: Cigarettes  . Smokeless tobacco: Never Used  . Alcohol use  Yes     Comment: Pt reports restocking her liquor cabinet each week.  1 gallon of vodka in a weekend     Allergies   Ciprofloxacin and Septra [sulfamethoxazole-trimethoprim]   Review of Systems Review of Systems All systems reviewed and are negative for acute change except as noted in the HPI.   Physical Exam Updated Vital Signs BP (!) 141/90   Pulse (!) 138   Temp 98 F (36.7 C) (Oral)   Resp (!) 31   Ht 5' 4"  (1.626 m)   Wt 235 lb (106.6 kg)   SpO2 98%   BMI 40.34 kg/m   Physical Exam  Constitutional: She is oriented to person, place, and time. She appears well-developed and well-nourished. No distress.  HENT:  Head: Normocephalic and  atraumatic.  Eyes: Conjunctivae and EOM are normal. Pupils are equal, round, and reactive to light.  Cardiovascular: Normal rate.   tachycardic  Pulmonary/Chest: Effort normal and breath sounds normal. She has no wheezes. She has no rales.  Abdominal: She exhibits no distension.  RUQ, LUQ and epigastric tenderness with minimal guarding.   Neurological: She is alert and oriented to person, place, and time.  Tremulous on exam but does decrease when disracted. Strength 5/5 in all extremities. Sensation intact.   Skin: Skin is warm and dry.  Psychiatric: Her mood appears anxious. Her speech is rapid and/or pressured. She is agitated.  Nursing note and vitals reviewed.    ED Treatments / Results  DIAGNOSTIC STUDIES:  Oxygen Saturation is 97% on RA, normal by my interpretation.    COORDINATION OF CARE:  3:17 PM Discussed treatment plan with pt at bedside and pt agreed to plan.   Labs (all labs ordered are listed, but only abnormal results are displayed) Labs Reviewed  COMPREHENSIVE METABOLIC PANEL - Abnormal; Notable for the following:       Result Value   Chloride 100 (*)    Glucose, Bld 112 (*)    Total Protein 8.6 (*)    AST 67 (*)    ALT 134 (*)    All other components within normal limits  URINALYSIS, ROUTINE W REFLEX MICROSCOPIC - Abnormal; Notable for the following:    Hgb urine dipstick TRACE (*)    Ketones, ur 15 (*)    All other components within normal limits  URINALYSIS, MICROSCOPIC (REFLEX) - Abnormal; Notable for the following:    Bacteria, UA MANY (*)    Squamous Epithelial / LPF 0-5 (*)    All other components within normal limits  CBC WITH DIFFERENTIAL/PLATELET  ETHANOL  PREGNANCY, URINE  RAPID URINE DRUG SCREEN, HOSP PERFORMED  LIPASE, BLOOD    EKG  EKG Interpretation  Date/Time:  Sunday January 25 2017 14:21:11 EDT Ventricular Rate:  113 PR Interval:  140 QRS Duration: 88 QT Interval:  336 QTC Calculation: 460 R Axis:   105 Text Interpretation:   Sinus tachycardia Rightward axis Cannot rule out Anterior infarct , age undetermined Abnormal ECG No significant change since last tracing Confirmed by [facility] MD, PEDRO ([id number]) on 01/25/2017 2:49:12 PM       Radiology No results found.  Procedures Procedures (including critical care time)  Medications Ordered in ED Medications  sodium chloride 0.9 % bolus 1,000 mL (1,000 mLs Intravenous New Bag/Given 01/25/17 1442)  ondansetron (ZOFRAN) injection 4 mg (4 mg Intravenous Given 01/25/17 1443)     Initial Impression / Assessment and Plan / ED Course  I have reviewed the triage vital signs and the nursing  notes.  Pertinent labs & imaging results that were available during my care of the patient were reviewed by me and considered in my medical decision making (see chart for details).     Pt presenting with c/o of alcohol withdrawal, repeated vomiting and headache.  Pt has hx of migraines and states headache feels like her migraines.  She denies having sz from alcohol withdrawal in the past but did require overnight obs for withdrawal once.  Pt drinks several tumblers a day but last drink was beer this morning.  Pt is tachy and tremulous on exam but some of tremor resolves with distraction.  She is tearful and anxious.  HD stable but intermittently tachycardic in a sinus rhythm.  Pt desiring detox.  Labs sent and pt given ativan and headache control and protonix.  CMP with elevated LFT's, lipase pending and CBC wnl.  Pt given IVF and will re-assess.  4:48 PM UDS neg.  Etoh <5 and lipase wnl.  Pt more calm after ativan but still tachy and nauseated.  Will give second mg of ativan.  Will get CIWA  6:43 PM CIWA improved from 30 to now less than 10.  Pt is sleeping and no tremor and HR 96.  Given outpt resources and librium taper  Final Clinical Impressions(s) / ED Diagnoses   Final diagnoses:  Alcohol abuse  Alcohol withdrawal syndrome without complication (HCC)  Acute alcoholic gastritis  without hemorrhage    New Prescriptions New Prescriptions   CHLORDIAZEPOXIDE (LIBRIUM) 25 MG CAPSULE    6m PO TID x 1D, then 25-52mPO BID X 1D, then 25-5016mO QD X 1D   ONDANSETRON (ZOFRAN ODT) 4 MG DISINTEGRATING TABLET    Take 1 tablet (4 mg total) by mouth every 8 (eight) hours as needed for nausea or vomiting.   I personally performed the services described in this documentation, which was scribed in my presence.  The recorded information has been reviewed and considered.      WhiBlanchie DessertD 01/25/17 [phone]

## 2017-01-25 NOTE — ED Notes (Signed)
Appears calmer but c/o of nausea. Denies hallucinations.

## 2017-01-29 DIAGNOSIS — R300 Dysuria: Secondary | ICD-10-CM | POA: Diagnosis not present

## 2017-01-29 DIAGNOSIS — J309 Allergic rhinitis, unspecified: Secondary | ICD-10-CM | POA: Diagnosis not present

## 2017-02-12 DIAGNOSIS — G4700 Insomnia, unspecified: Secondary | ICD-10-CM | POA: Diagnosis not present

## 2017-02-19 ENCOUNTER — Emergency Department (HOSPITAL_BASED_OUTPATIENT_CLINIC_OR_DEPARTMENT_OTHER)
Admission: EM | Admit: 2017-02-19 | Discharge: 2017-02-19 | Disposition: A | Discharge status: Home / Self Care | Payer: 59 | Attending: Emergency Medicine | Admitting: Emergency Medicine

## 2017-02-19 ENCOUNTER — Encounter (HOSPITAL_BASED_OUTPATIENT_CLINIC_OR_DEPARTMENT_OTHER): Payer: Self-pay

## 2017-02-19 DIAGNOSIS — I10 Essential (primary) hypertension: Secondary | ICD-10-CM | POA: Diagnosis not present

## 2017-02-19 DIAGNOSIS — F17210 Nicotine dependence, cigarettes, uncomplicated: Secondary | ICD-10-CM | POA: Diagnosis not present

## 2017-02-19 DIAGNOSIS — F1020 Alcohol dependence, uncomplicated: Secondary | ICD-10-CM | POA: Insufficient documentation

## 2017-02-19 DIAGNOSIS — Y908 Blood alcohol level of 240 mg/100 ml or more: Secondary | ICD-10-CM | POA: Diagnosis not present

## 2017-02-19 DIAGNOSIS — Z0389 Encounter for observation for other suspected diseases and conditions ruled out: Secondary | ICD-10-CM | POA: Diagnosis not present

## 2017-02-19 DIAGNOSIS — R112 Nausea with vomiting, unspecified: Secondary | ICD-10-CM | POA: Diagnosis present

## 2017-02-19 DIAGNOSIS — Z79899 Other long term (current) drug therapy: Secondary | ICD-10-CM | POA: Insufficient documentation

## 2017-02-19 DIAGNOSIS — J45909 Unspecified asthma, uncomplicated: Secondary | ICD-10-CM | POA: Diagnosis not present

## 2017-02-19 HISTORY — DX: Alcoholic cirrhosis of liver without ascites: K70.30

## 2017-02-19 LAB — COMPREHENSIVE METABOLIC PANEL
ALBUMIN: 4.1 g/dL (ref 3.5–5.0)
ALT: 42 U/L (ref 14–54)
AST: 34 U/L (ref 15–41)
Alkaline Phosphatase: 64 U/L (ref 38–126)
Anion gap: 12 (ref 5–15)
BUN: 5 mg/dL — AB (ref 6–20)
CHLORIDE: 108 mmol/L (ref 101–111)
CO2: 24 mmol/L (ref 22–32)
CREATININE: 0.56 mg/dL (ref 0.44–1.00)
Calcium: 8.9 mg/dL (ref 8.9–10.3)
GFR calc Af Amer: >60 mL/min (ref >60)
GFR calc non Af Amer: >60 mL/min (ref >60)
GLUCOSE: 102 mg/dL — AB (ref 65–99)
POTASSIUM: 3.7 mmol/L (ref 3.5–5.1)
Sodium: 144 mmol/L (ref 135–145)
Total Bilirubin: 1.2 mg/dL (ref 0.3–1.2)
Total Protein: 7.5 g/dL (ref 6.5–8.1)

## 2017-02-19 LAB — RAPID URINE DRUG SCREEN, HOSP PERFORMED
Amphetamines: NOT DETECTED
Barbiturates: NOT DETECTED
Benzodiazepines: POSITIVE — AB
Cocaine: NOT DETECTED
Opiates: NOT DETECTED
Tetrahydrocannabinol: NOT DETECTED

## 2017-02-19 LAB — CBC WITH DIFFERENTIAL/PLATELET
Basophils Absolute: 0 10*3/uL (ref 0.0–0.1)
Basophils Relative: 1 %
EOS PCT: 2 %
Eosinophils Absolute: 0.1 10*3/uL (ref 0.0–0.7)
HCT: 39 % (ref 36.0–46.0)
Hemoglobin: 13.7 g/dL (ref 12.0–15.0)
LYMPHS ABS: 2 10*3/uL (ref 0.7–4.0)
Lymphocytes Relative: 37 %
MCH: 31.7 pg (ref 26.0–34.0)
MCHC: 35.1 g/dL (ref 30.0–36.0)
MCV: 90.3 fL (ref 78.0–100.0)
MONO ABS: 0.5 10*3/uL (ref 0.1–1.0)
Monocytes Relative: 9 %
Neutro Abs: 2.8 10*3/uL (ref 1.7–7.7)
Neutrophils Relative %: 51 %
PLATELETS: 359 10*3/uL (ref 150–400)
RBC: 4.32 MIL/uL (ref 3.87–5.11)
RDW: 13.5 % (ref 11.5–15.5)
WBC: 5.4 10*3/uL (ref 4.0–10.5)

## 2017-02-19 LAB — ETHANOL: Alcohol, Ethyl (B): 295 mg/dL — ABNORMAL HIGH (ref <5)

## 2017-02-19 LAB — PREGNANCY, URINE: Preg Test, Ur: NEGATIVE

## 2017-02-19 LAB — LIPASE, BLOOD: LIPASE: 14 U/L (ref 11–51)

## 2017-02-19 MED ORDER — ONDANSETRON HCL 4 MG/2ML IJ SOLN
4.0000 mg | Freq: Once | INTRAMUSCULAR | Status: AC
  Administered 2017-02-19: 4 mg via INTRAVENOUS
  Filled 2017-02-19: qty 2

## 2017-02-19 MED ORDER — SODIUM CHLORIDE 0.9 % IV BOLUS (SEPSIS)
1000.0000 mL | Freq: Once | INTRAVENOUS | Status: AC
  Administered 2017-02-19: 1000 mL via INTRAVENOUS

## 2017-02-19 MED ORDER — ONDANSETRON 4 MG PO TBDP: 4.0000 mg | ORAL_TABLET | Freq: Three times a day (TID) | ORAL | 0 refills | Status: DC | PRN

## 2017-02-19 MED ORDER — CHLORDIAZEPOXIDE HCL 25 MG PO CAPS: ORAL_CAPSULE | ORAL | 0 refills | Status: DC

## 2017-02-19 NOTE — ED Provider Notes (Signed)
[facility] DEPT [facility] Provider Note   CSN: [id number] Arrival date & time: 02/19/17  1619     History   Chief Complaint Chief Complaint  Patient presents with  . Alcohol Problem    HPI Brandi Kirk is a 31 y.o. female.  HPI Patient presents after a relapse on alcohol. Had been sober for around 2 weeks and then relapsed again 5 days ago. Has been drinking heavily. States today she drinks 3 shots of bourbon. States she drank some beer this morning but vomited up. States the burping helps. The chest pains that she gets with this. Patient states she does not get depressed as she is a [place]. States no suicidal thoughts because she loves her children. Denies other substance abuse. Had read sent inpatient treatment for her alcohol. No longer in any treatment.   Past Medical History:  Diagnosis Date  . 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 Depression   . ETOH abuse   . HPV in female   . Hypertension   . Migraine   . PCOS (polycystic ovarian syndrome)   . Tension headache   . Vocal cord dysfunction     Patient Active Problem List   Diagnosis Date Noted  . Alcohol withdrawal ([place]) 11/25/2016  . Asthma 11/25/2016  . Abnormal transaminases 11/25/2016  . Acute hyperglycemia 11/25/2016  . 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 HTN (hypertension) 08/21/2008  . Vocal cord dysfunction 08/21/2008  . ASTHMA NOS W/O STATUS ASTHMATICUS 08/21/2008    Past Surgical History:  Procedure Laterality Date  . WISDOM TOOTH EXTRACTION      OB History    No data available       Home Medications    Prior to Admission medications   Medication Sig Start Date End Date Taking? Authorizing Provider  chlordiazePOXIDE (LIBRIUM) 25 MG capsule 81m PO TID x 1D, then 25-520mPO BID X 1D, then 25-5047mO QD X 1D 02/19/17   PicDavonna BellingD  folic acid (FOLVITE) 1 MG tablet Take 1 tablet (1 mg total) by mouth daily. 11/26/16   DevRobbie LisD  levonorgestrel ([facility]0 MCG/24HR IUD 1 each  by Intrauterine route once.    [provider]  ondansetron (ZOFRAN-ODT) 4 MG disintegrating tablet Take 1 tablet (4 mg total) by mouth every 8 (eight) hours as needed for nausea or vomiting. 02/19/17   PicDavonna BellingD  thiamine 100 MG tablet Take 1 tablet (100 mg total) by mouth daily. 11/27/16   DevRobbie LisD    Family History Family History  Problem Relation Age of Onset  . Diabetes Mother   . Stroke Mother   . Hypertension Mother   . Heart murmur Mother   . Rheum arthritis Sister   . Heart murmur Sister   . Heart murmur Brother     Social History Social History  Substance Use Topics  . Smoking status: Current Some Day Smoker    Types: Cigarettes  . Smokeless tobacco: Never Used  . Alcohol use Yes     Allergies   Ciprofloxacin and Septra [sulfamethoxazole-trimethoprim]   Review of Systems Review of Systems  Constitutional: Negative for appetite change and fever.  HENT: Negative for congestion.   Respiratory: Negative for choking.   Cardiovascular: Negative for chest pain.  Gastrointestinal: Positive for nausea and vomiting. Negative for abdominal pain.  Genitourinary: Negative for dysuria.  Musculoskeletal: Negative for back pain.  Neurological: Negative for seizures.  Psychiatric/Behavioral: Negative for decreased concentration, dysphoric mood and  hallucinations. The patient is not hyperactive.      Physical Exam Updated Vital Signs BP 129/85   Pulse 89   Temp 99 F (37.2 C) (Oral)   Resp 20   Ht 5' 4"  (1.626 m)   Wt 107 kg (236 lb)   SpO2 99%   BMI 40.51 kg/m   Physical Exam  Constitutional: She appears well-developed.  Patient smells of alcohol  Eyes: Pupils are equal, round, and reactive to light.  Neck: Neck supple.  Cardiovascular:  Mild tachycardia  Pulmonary/Chest: Effort normal.  Abdominal: Soft. There is no tenderness.  Neurological: She is alert.     ED Treatments / Results  Labs (all labs ordered are listed, but  only abnormal results are displayed) Labs Reviewed  COMPREHENSIVE METABOLIC PANEL - Abnormal; Notable for the following:       Result Value   Glucose, Bld 102 (*)    BUN 5 (*)    All other components within normal limits  RAPID URINE DRUG SCREEN, HOSP PERFORMED - Abnormal; Notable for the following:    Benzodiazepines POSITIVE (*)    All other components within normal limits  ETHANOL - Abnormal; Notable for the following:    Alcohol, Ethyl (B) 295 (*)    All other components within normal limits  CBC WITH DIFFERENTIAL/PLATELET  PREGNANCY, URINE  LIPASE, BLOOD    EKG  EKG Interpretation  Date/Time:  Thursday Feb 19 2017 17:10:18 EDT Ventricular Rate:  82 PR Interval:    QRS Duration: 97 QT Interval:  394 QTC Calculation: 463 R Axis:   101 Text Interpretation:  Sinus rhythm Borderline right axis deviation Baseline wander in lead(s) I II aVR V2 Confirmed by Alvino Chapel  MD, Tashan Kreitzer [phone]) on 02/19/2017 6:48:55 PM       Radiology No results found.  Procedures Procedures (including critical care time)  Medications Ordered in ED Medications  sodium chloride 0.9 % bolus 1,000 mL (0 mLs Intravenous Stopped 02/19/17 1859)  ondansetron (ZOFRAN) injection 4 mg (4 mg Intravenous Given 02/19/17 2112)     Initial Impression / Assessment and Plan / ED Course  I have reviewed the triage vital signs and the nursing notes.  Pertinent labs & imaging results that were available during my care of the patient were reviewed by me and considered in my medical decision making (see chart for details).     Patient with alcohol abuse. Alcohol level elevated. Has not had seizures in the past. Will discharge his Librium. Patient has tolerated orals here after Zofran. Feels better. Eager to be discharged home. Will discharge with resources.  Final Clinical Impressions(s) / ED Diagnoses   Final diagnoses:  Alcoholism ([place])    New Prescriptions New Prescriptions   CHLORDIAZEPOXIDE (LIBRIUM)  25 MG CAPSULE    42m PO TID x 1D, then 25-548mPO BID X 1D, then 25-5037mO QD X 1D   ONDANSETRON (ZOFRAN-ODT) 4 MG DISINTEGRATING TABLET    Take 1 tablet (4 mg total) by mouth every 8 (eight) hours as needed for nausea or vomiting.     PicDavonna BellingD 02/19/17 2139

## 2017-02-19 NOTE — ED Notes (Signed)
Assumed care of patient from [place], [place]. PT resting quietly no distress, awake, watching TV, ice chips given. Family at side.

## 2017-02-19 NOTE — ED Triage Notes (Signed)
C/o "i'm having a relapse"-last ETOH was "2 shots" liquor at 2pm-pt is not in treatment facility-NAD-steady gait-husband with pt

## 2017-02-19 NOTE — ED Notes (Signed)
Pt amb w/ minimal asst to [facility]

## 2017-02-19 NOTE — ED Notes (Signed)
Pt sitting up in bed vomiting. Pt asking for nausea medication. MD made aware.

## 2017-03-20 DIAGNOSIS — R0602 Shortness of breath: Secondary | ICD-10-CM | POA: Diagnosis not present

## 2017-03-20 DIAGNOSIS — J209 Acute bronchitis, unspecified: Secondary | ICD-10-CM | POA: Diagnosis not present

## 2017-04-09 ENCOUNTER — Encounter (HOSPITAL_BASED_OUTPATIENT_CLINIC_OR_DEPARTMENT_OTHER): Payer: Self-pay | Admitting: *Deleted

## 2017-04-09 ENCOUNTER — Emergency Department (HOSPITAL_BASED_OUTPATIENT_CLINIC_OR_DEPARTMENT_OTHER)
Admission: EM | Admit: 2017-04-09 | Discharge: 2017-04-09 | Disposition: A | Discharge status: Home / Self Care | Payer: 59 | Attending: Emergency Medicine | Admitting: Emergency Medicine

## 2017-04-09 DIAGNOSIS — K7030 Alcoholic cirrhosis of liver without ascites: Secondary | ICD-10-CM | POA: Insufficient documentation

## 2017-04-09 DIAGNOSIS — I10 Essential (primary) hypertension: Secondary | ICD-10-CM | POA: Insufficient documentation

## 2017-04-09 DIAGNOSIS — Y900 Blood alcohol level of less than 20 mg/100 ml: Secondary | ICD-10-CM | POA: Diagnosis not present

## 2017-04-09 DIAGNOSIS — J45909 Unspecified asthma, uncomplicated: Secondary | ICD-10-CM | POA: Insufficient documentation

## 2017-04-09 DIAGNOSIS — F17210 Nicotine dependence, cigarettes, uncomplicated: Secondary | ICD-10-CM | POA: Insufficient documentation

## 2017-04-09 DIAGNOSIS — R1084 Generalized abdominal pain: Secondary | ICD-10-CM | POA: Diagnosis present

## 2017-04-09 DIAGNOSIS — Z79899 Other long term (current) drug therapy: Secondary | ICD-10-CM | POA: Diagnosis not present

## 2017-04-09 DIAGNOSIS — F10930 Alcohol use, unspecified with withdrawal, uncomplicated: Secondary | ICD-10-CM

## 2017-04-09 DIAGNOSIS — F10230 Alcohol dependence with withdrawal, uncomplicated: Secondary | ICD-10-CM | POA: Diagnosis not present

## 2017-04-09 LAB — RAPID URINE DRUG SCREEN, HOSP PERFORMED
AMPHETAMINES: NOT DETECTED
BENZODIAZEPINES: POSITIVE — AB
Barbiturates: NOT DETECTED
Cocaine: NOT DETECTED
OPIATES: NOT DETECTED
Tetrahydrocannabinol: NOT DETECTED

## 2017-04-09 LAB — COMPREHENSIVE METABOLIC PANEL
ALT: 157 U/L — ABNORMAL HIGH (ref 14–54)
AST: 101 U/L — ABNORMAL HIGH (ref 15–41)
Albumin: 4.4 g/dL (ref 3.5–5.0)
Alkaline Phosphatase: 82 U/L (ref 38–126)
Anion gap: 15 (ref 5–15)
BUN: 9 mg/dL (ref 6–20)
CHLORIDE: 95 mmol/L — AB (ref 101–111)
CO2: 24 mmol/L (ref 22–32)
Calcium: 8.5 mg/dL — ABNORMAL LOW (ref 8.9–10.3)
Creatinine, Ser: 0.9 mg/dL (ref 0.44–1.00)
Glucose, Bld: 125 mg/dL — ABNORMAL HIGH (ref 65–99)
POTASSIUM: 3.7 mmol/L (ref 3.5–5.1)
Sodium: 134 mmol/L — ABNORMAL LOW (ref 135–145)
Total Bilirubin: 1.5 mg/dL — ABNORMAL HIGH (ref 0.3–1.2)
Total Protein: 7.7 g/dL (ref 6.5–8.1)

## 2017-04-09 LAB — URINALYSIS, ROUTINE W REFLEX MICROSCOPIC
BILIRUBIN URINE: NEGATIVE
Glucose, UA: NEGATIVE mg/dL
Hgb urine dipstick: NEGATIVE
Ketones, ur: 15 mg/dL — AB
Leukocytes, UA: NEGATIVE
NITRITE: NEGATIVE
PH: 7 (ref 5.0–8.0)
Protein, ur: NEGATIVE mg/dL
SPECIFIC GRAVITY, URINE: 1.027 (ref 1.005–1.030)

## 2017-04-09 LAB — CBC
HCT: 39.3 % (ref 36.0–46.0)
Hemoglobin: 13.8 g/dL (ref 12.0–15.0)
MCH: 32.4 pg (ref 26.0–34.0)
MCHC: 35.1 g/dL (ref 30.0–36.0)
MCV: 92.3 fL (ref 78.0–100.0)
PLATELETS: 347 10*3/uL (ref 150–400)
RBC: 4.26 MIL/uL (ref 3.87–5.11)
RDW: 14 % (ref 11.5–15.5)
WBC: 10.3 10*3/uL (ref 4.0–10.5)

## 2017-04-09 LAB — PREGNANCY, URINE: Preg Test, Ur: NEGATIVE

## 2017-04-09 LAB — LIPASE, BLOOD: LIPASE: 22 U/L (ref 11–51)

## 2017-04-09 LAB — ETHANOL

## 2017-04-09 MED ORDER — LORAZEPAM 2 MG/ML IJ SOLN
0.0000 mg | Freq: Four times a day (QID) | INTRAMUSCULAR | Status: DC
  Administered 2017-04-09: 1 mg via INTRAVENOUS

## 2017-04-09 MED ORDER — ONDANSETRON HCL 4 MG/2ML IJ SOLN
4.0000 mg | Freq: Once | INTRAMUSCULAR | Status: DC
  Filled 2017-04-09: qty 2

## 2017-04-09 MED ORDER — LORAZEPAM 2 MG/ML IJ SOLN
1.0000 mg | Freq: Once | INTRAMUSCULAR | Status: DC
  Filled 2017-04-09: qty 1

## 2017-04-09 MED ORDER — THIAMINE HCL 100 MG/ML IJ SOLN
100.0000 mg | Freq: Once | INTRAMUSCULAR | Status: AC
  Administered 2017-04-09: 100 mg via INTRAVENOUS
  Filled 2017-04-09: qty 2

## 2017-04-09 MED ORDER — METOCLOPRAMIDE HCL 5 MG/ML IJ SOLN
10.0000 mg | Freq: Once | INTRAMUSCULAR | Status: AC
  Administered 2017-04-09: 10 mg via INTRAVENOUS
  Filled 2017-04-09: qty 2

## 2017-04-09 MED ORDER — SODIUM CHLORIDE 0.9 % IV BOLUS (SEPSIS)
1000.0000 mL | Freq: Once | INTRAVENOUS | Status: AC
  Administered 2017-04-09: 1000 mL via INTRAVENOUS

## 2017-04-09 MED ORDER — FOLIC ACID 1 MG PO TABS
1.0000 mg | ORAL_TABLET | Freq: Once | ORAL | Status: AC
  Administered 2017-04-09: 1 mg via ORAL
  Filled 2017-04-09: qty 1

## 2017-04-09 MED ORDER — LORAZEPAM 2 MG/ML IJ SOLN: 0.0000 mg | Freq: Two times a day (BID) | INTRAMUSCULAR | Status: DC

## 2017-04-09 MED ORDER — LORAZEPAM 1 MG PO TABS: 0.0000 mg | ORAL_TABLET | Freq: Four times a day (QID) | ORAL | Status: DC

## 2017-04-09 MED ORDER — CHLORDIAZEPOXIDE HCL 25 MG PO CAPS: ORAL_CAPSULE | ORAL | 0 refills | Status: DC

## 2017-04-09 MED ORDER — PANTOPRAZOLE SODIUM 20 MG PO TBEC: 20.0000 mg | DELAYED_RELEASE_TABLET | Freq: Every day | ORAL | 0 refills | Status: DC

## 2017-04-09 MED ORDER — LORAZEPAM 1 MG PO TABS: 0.0000 mg | ORAL_TABLET | Freq: Two times a day (BID) | ORAL | Status: DC

## 2017-04-09 MED ORDER — PROMETHAZINE HCL 25 MG PO TABS: 25.0000 mg | ORAL_TABLET | Freq: Four times a day (QID) | ORAL | 0 refills | Status: DC | PRN

## 2017-04-09 MED FILL — PROMETHAZINE 25 MG TABLET: 25 | 8 days supply | Qty: 30 | Fill #0

## 2017-04-09 MED FILL — PANTOPRAZOLE SOD DR 20 MG T: 20 | 30 days supply | Qty: 30 | Fill #0

## 2017-04-09 MED FILL — CHLORDIAZEPOXIDE 25 MG CAP: 25 | 3 days supply | Qty: 10 | Fill #0

## 2017-04-09 NOTE — Discharge Instructions (Signed)
Please read and follow all provided instructions.  Your diagnoses today include:  1. Alcohol withdrawal syndrome without complication ([place])     Tests performed today include: Vital signs. See below for your results today.   Medications prescribed:  Take as prescribed   Home care instructions:  Follow any educational materials contained in this packet.  Follow-up instructions: Please follow-up with your primary care provider for further evaluation of symptoms and treatment   Return instructions:  Please return to the Emergency Department if you do not get better, if you get worse, or new symptoms OR  - Fever (temperature greater than 101.71F)  - Bleeding that does not stop with holding pressure to the area    -Severe pain (please note that you may be more sore the day after your accident)  - Chest Pain  - Difficulty breathing  - Severe nausea or vomiting  - Inability to tolerate food and liquids  - Passing out  - Skin becoming red around your wounds  - Change in mental status (confusion or lethargy)  - New numbness or weakness    Please return if you have any other emergent concerns.  Additional Information:  Your vital signs today were: BP 125/82    Pulse 91    Temp 98.1 F (36.7 C) (Oral)    Resp 19    Ht 5' 4"  (1.626 m)    Wt 101.2 kg (223 lb)    SpO2 95%    BMI 38.28 kg/m  If your blood pressure (BP) was elevated above 135/85 this visit, please have this repeated by your doctor within one month. ---------------

## 2017-04-09 NOTE — ED Triage Notes (Signed)
States she is going through alcohol withdrawal. Last drink was yesterday when she drank 2 1/2 bottles of wine. She has been vomiting and having diarrhea. Last detox was 2 months ago.
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
## 2017-04-13 DIAGNOSIS — R300 Dysuria: Secondary | ICD-10-CM | POA: Diagnosis not present

## 2017-04-13 DIAGNOSIS — Z0001 Encounter for general adult medical examination with abnormal findings: Secondary | ICD-10-CM | POA: Diagnosis not present

## 2017-04-13 DIAGNOSIS — I10 Essential (primary) hypertension: Secondary | ICD-10-CM | POA: Diagnosis not present

## 2017-04-13 LAB — CBC AND DIFFERENTIAL
HCT: 39 (ref 36–46)
Hemoglobin: 13.5 (ref 12.0–16.0)
Platelets: 234 (ref 150–399)
WBC: 6.4

## 2017-04-13 LAB — HEPATIC FUNCTION PANEL
ALT: 125 — AB (ref 7–35)
AST: 82 — AB (ref 13–35)

## 2017-04-13 LAB — BASIC METABOLIC PANEL
BUN: 9 (ref 4–21)
Creatinine: 0.6 (ref 0.5–1.1)
GLUCOSE: 103
POTASSIUM: 4.7 (ref 3.4–5.3)
SODIUM: 137 (ref 137–147)

## 2017-04-16 DIAGNOSIS — Z124 Encounter for screening for malignant neoplasm of cervix: Secondary | ICD-10-CM | POA: Diagnosis not present

## 2017-04-16 DIAGNOSIS — Z01419 Encounter for gynecological examination (general) (routine) without abnormal findings: Secondary | ICD-10-CM | POA: Diagnosis not present

## 2017-04-28 DIAGNOSIS — R102 Pelvic and perineal pain: Secondary | ICD-10-CM | POA: Diagnosis not present

## 2017-05-05 DIAGNOSIS — R300 Dysuria: Secondary | ICD-10-CM | POA: Diagnosis not present

## 2017-07-17 ENCOUNTER — Emergency Department (HOSPITAL_COMMUNITY)
Admission: EM | Admit: 2017-07-17 | Discharge: 2017-07-17 | Disposition: A | Discharge status: Home / Self Care | Payer: 59 | Attending: Emergency Medicine | Admitting: Emergency Medicine

## 2017-07-17 ENCOUNTER — Encounter (HOSPITAL_COMMUNITY): Payer: Self-pay

## 2017-07-17 ENCOUNTER — Emergency Department (HOSPITAL_COMMUNITY): Payer: 59

## 2017-07-17 DIAGNOSIS — R079 Chest pain, unspecified: Secondary | ICD-10-CM | POA: Diagnosis not present

## 2017-07-17 DIAGNOSIS — Z5321 Procedure and treatment not carried out due to patient leaving prior to being seen by health care provider: Secondary | ICD-10-CM | POA: Diagnosis not present

## 2017-07-17 DIAGNOSIS — R112 Nausea with vomiting, unspecified: Secondary | ICD-10-CM | POA: Diagnosis not present

## 2017-07-17 LAB — RAPID URINE DRUG SCREEN, HOSP PERFORMED
AMPHETAMINES: NOT DETECTED
BENZODIAZEPINES: NOT DETECTED
Barbiturates: NOT DETECTED
COCAINE: NOT DETECTED
OPIATES: NOT DETECTED
Tetrahydrocannabinol: NOT DETECTED

## 2017-07-17 LAB — BASIC METABOLIC PANEL
ANION GAP: 13 (ref 5–15)
BUN: 6 mg/dL (ref 6–20)
CHLORIDE: 99 mmol/L — AB (ref 101–111)
CO2: 23 mmol/L (ref 22–32)
Calcium: 8.2 mg/dL — ABNORMAL LOW (ref 8.9–10.3)
Creatinine, Ser: 0.74 mg/dL (ref 0.44–1.00)
GFR calc Af Amer: >60 mL/min (ref >60)
GFR calc non Af Amer: >60 mL/min (ref >60)
Glucose, Bld: 125 mg/dL — ABNORMAL HIGH (ref 65–99)
POTASSIUM: 3.6 mmol/L (ref 3.5–5.1)
SODIUM: 135 mmol/L (ref 135–145)

## 2017-07-17 LAB — CBC
HEMATOCRIT: 38 % (ref 36.0–46.0)
HEMOGLOBIN: 12.8 g/dL (ref 12.0–15.0)
MCH: 31 pg (ref 26.0–34.0)
MCHC: 33.7 g/dL (ref 30.0–36.0)
MCV: 92 fL (ref 78.0–100.0)
Platelets: 366 10*3/uL (ref 150–400)
RBC: 4.13 MIL/uL (ref 3.87–5.11)
RDW: 14.1 % (ref 11.5–15.5)
WBC: 5.5 10*3/uL (ref 4.0–10.5)

## 2017-07-17 LAB — I-STAT TROPONIN, ED: Troponin i, poc: 0 ng/mL (ref 0.00–0.08)

## 2017-07-17 LAB — ETHANOL: ALCOHOL ETHYL (B): 320 mg/dL — AB (ref <10)

## 2017-07-17 MED ORDER — LORAZEPAM 1 MG PO TABS
2.0000 mg | ORAL_TABLET | Freq: Once | ORAL | Status: AC
  Administered 2017-07-17: 2 mg via ORAL
  Filled 2017-07-17: qty 2

## 2017-07-17 NOTE — ED Notes (Addendum)
EDP notifed of pt CIWA at 7-order given for ativan; pt kept in triage waiting for observation per EDP

## 2017-07-17 NOTE — ED Triage Notes (Signed)
Pt arrives to ED from home with significant other; pt states she is a recovering alcoholic and relapsed 4 days ago and is now in withdrawals;pt state last drink was yesterday; pt claims she went into waiting room bathroom and had syncope episode but no staff was notified or witnessed fall; Pt states she hit her head and does not know if she tripped or passed out; Per Nurse first, tech first, and registation pt never went to Ed waiting room bathroom, pt was brought straight to triage after registration was completed; Triage RN made pt fall risk and pt will be kept in triage waiting for careful observation for withdraw sx and c/o dizziness; Triage RN unsure if pt really fell; pt was shaky on question answered and significant other had to give most of triage answers. Pt states chest pain is normal for her when withdrawing; pt c/o left side chest pain at 5/10-Monique,RN

## 2017-07-17 NOTE — ED Notes (Signed)
Spouse seen taking pt out of triage and leaving Ed

## 2017-07-17 NOTE — ED Notes (Addendum)
Patient came in through front door.  C/o chest pain at the desk.  Placed in wheelchair by tech Dorothea Ogle.  On arrival to triage told nurse that she fell in our bathroom.  Patient went from registration directly to triage.
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
## 2017-07-17 NOTE — ED Notes (Signed)
Told triage nurse that there was a shorter wait at [facility] so they would be going there.

## 2017-07-20 LAB — POC URINE PREG, ED: PREG TEST UR: NEGATIVE

## 2017-07-30 DIAGNOSIS — K921 Melena: Secondary | ICD-10-CM

## 2017-07-30 HISTORY — DX: Melena: K92.1

## 2017-08-13 DIAGNOSIS — R1110 Vomiting, unspecified: Secondary | ICD-10-CM | POA: Diagnosis not present

## 2017-08-13 DIAGNOSIS — R9389 Abnormal findings on diagnostic imaging of other specified body structures: Secondary | ICD-10-CM | POA: Diagnosis not present

## 2017-08-13 DIAGNOSIS — R1013 Epigastric pain: Secondary | ICD-10-CM | POA: Diagnosis not present

## 2017-08-13 DIAGNOSIS — R079 Chest pain, unspecified: Secondary | ICD-10-CM | POA: Diagnosis not present

## 2017-08-13 DIAGNOSIS — R109 Unspecified abdominal pain: Secondary | ICD-10-CM | POA: Diagnosis not present

## 2017-08-14 MED ORDER — THERA PO TABS
ORAL_TABLET | ORAL | Status: DC
Start: 2017-08-15 — End: 2017-08-14

## 2017-08-14 MED ORDER — CLONIDINE HCL 0.1 MG PO TABS
.10 | ORAL_TABLET | ORAL | Status: DC
Start: ? — End: 2017-08-14

## 2017-08-14 MED ORDER — THIAMINE HCL 100 MG PO TABS
100.00 | ORAL_TABLET | ORAL | Status: DC
Start: 2017-08-15 — End: 2017-08-14

## 2017-08-14 MED ORDER — GENERIC EXTERNAL MEDICATION
Status: DC
Start: ? — End: 2017-08-14

## 2017-08-14 MED ORDER — SODIUM CHLORIDE 0.9 % IV SOLN
INTRAVENOUS | Status: DC
Start: ? — End: 2017-08-14

## 2017-08-14 MED ORDER — FOLIC ACID 1 MG PO TABS
1.00 | ORAL_TABLET | ORAL | Status: DC
Start: 2017-08-15 — End: 2017-08-14

## 2017-08-14 MED ORDER — PANTOPRAZOLE SODIUM 40 MG PO TBEC
40.00 | DELAYED_RELEASE_TABLET | ORAL | Status: DC
Start: 2017-08-15 — End: 2017-08-14

## 2017-08-18 DIAGNOSIS — R3915 Urgency of urination: Secondary | ICD-10-CM | POA: Diagnosis not present

## 2017-08-18 DIAGNOSIS — K648 Other hemorrhoids: Secondary | ICD-10-CM | POA: Diagnosis not present

## 2017-09-27 DIAGNOSIS — R112 Nausea with vomiting, unspecified: Secondary | ICD-10-CM | POA: Diagnosis not present

## 2017-09-27 DIAGNOSIS — R000 Tachycardia, unspecified: Secondary | ICD-10-CM | POA: Diagnosis not present

## 2017-09-28 DIAGNOSIS — I10 Essential (primary) hypertension: Secondary | ICD-10-CM | POA: Diagnosis not present

## 2017-09-28 DIAGNOSIS — T510X1A Toxic effect of ethanol, accidental (unintentional), initial encounter: Secondary | ICD-10-CM | POA: Diagnosis not present

## 2017-09-28 DIAGNOSIS — J4520 Mild intermittent asthma, uncomplicated: Secondary | ICD-10-CM | POA: Diagnosis not present

## 2017-09-29 DIAGNOSIS — S2239XA Fracture of one rib, unspecified side, initial encounter for closed fracture: Secondary | ICD-10-CM

## 2017-09-29 DIAGNOSIS — K2920 Alcoholic gastritis without bleeding: Secondary | ICD-10-CM | POA: Insufficient documentation

## 2017-09-29 HISTORY — DX: Fracture of one rib, unspecified side, initial encounter for closed fracture: S22.39XA

## 2017-09-30 DIAGNOSIS — I10 Essential (primary) hypertension: Secondary | ICD-10-CM | POA: Diagnosis not present

## 2017-10-02 MED ORDER — CHLOROBUTANOL-EUGENOL & APAP
0.40 | Status: DC
Start: ? — End: 2017-10-02

## 2017-10-02 MED ORDER — GENERIC EXTERNAL MEDICATION
Status: DC
Start: ? — End: 2017-10-02

## 2017-10-02 MED ORDER — POLYETHYLENE GLYCOL 3350 17 G PO PACK
17.00 | PACK | ORAL | Status: DC
Start: ? — End: 2017-10-02

## 2017-10-02 MED ORDER — SODIUM CHLORIDE 0.9 % IV SOLN
INTRAVENOUS | Status: DC
Start: ? — End: 2017-10-02

## 2017-10-02 MED ORDER — DERMAKLENZ EX
100.00 | CUTANEOUS | Status: DC
Start: ? — End: 2017-10-02

## 2017-10-02 MED ORDER — CHLOROPHYLL EX
10.00 | CUTANEOUS | Status: DC
Start: ? — End: 2017-10-02

## 2017-10-02 MED ORDER — PANTOPRAZOLE SODIUM 40 MG IV SOLR
40.00 | INTRAVENOUS | Status: DC
Start: 2017-10-01 — End: 2017-10-02

## 2017-10-02 MED ORDER — LOPERAMIDE HCL 2 MG PO CAPS
2.00 | ORAL_CAPSULE | ORAL | Status: DC
Start: ? — End: 2017-10-02

## 2017-10-02 MED ORDER — METOPROLOL TARTRATE 5 MG/5ML IV SOLN
2.50 | INTRAVENOUS | Status: DC
Start: ? — End: 2017-10-02

## 2017-10-02 MED ORDER — CELLULOSE SODIUM PHOSPHATE VI
Status: DC
Start: 2017-09-30 — End: 2017-10-02

## 2017-10-02 MED ORDER — FOLIC ACID 1 MG PO TABS
1.00 | ORAL_TABLET | ORAL | Status: DC
Start: ? — End: 2017-10-02

## 2017-10-02 MED ORDER — TRAZODONE HCL 50 MG PO TABS
50.00 | ORAL_TABLET | ORAL | Status: DC
Start: ? — End: 2017-10-02

## 2017-10-02 MED ORDER — ACETAMINOPHEN 325 MG PO TABS
650.00 | ORAL_TABLET | ORAL | Status: DC
Start: ? — End: 2017-10-02

## 2017-10-02 MED ORDER — BENZONATATE 100 MG PO CAPS
100.00 | ORAL_CAPSULE | ORAL | Status: DC
Start: ? — End: 2017-10-02

## 2017-10-02 MED ORDER — ANTACID & ANTIGAS 200-200-20 MG/5ML PO SUSP
30.00 | ORAL | Status: DC
Start: ? — End: 2017-10-02

## 2017-11-17 DIAGNOSIS — R945 Abnormal results of liver function studies: Secondary | ICD-10-CM | POA: Diagnosis not present

## 2017-11-17 DIAGNOSIS — R05 Cough: Secondary | ICD-10-CM | POA: Diagnosis not present

## 2017-11-17 DIAGNOSIS — E871 Hypo-osmolality and hyponatremia: Secondary | ICD-10-CM | POA: Diagnosis not present

## 2017-11-17 DIAGNOSIS — K219 Gastro-esophageal reflux disease without esophagitis: Secondary | ICD-10-CM | POA: Diagnosis not present

## 2017-11-17 DIAGNOSIS — Z888 Allergy status to other drugs, medicaments and biological substances status: Secondary | ICD-10-CM | POA: Diagnosis not present

## 2017-11-17 HISTORY — DX: Hypomagnesemia: E83.42

## 2017-11-18 DIAGNOSIS — F1910 Other psychoactive substance abuse, uncomplicated: Secondary | ICD-10-CM | POA: Insufficient documentation

## 2017-11-23 DIAGNOSIS — R748 Abnormal levels of other serum enzymes: Secondary | ICD-10-CM | POA: Diagnosis not present

## 2017-11-23 DIAGNOSIS — R300 Dysuria: Secondary | ICD-10-CM | POA: Diagnosis not present

## 2017-11-23 LAB — BASIC METABOLIC PANEL
BUN: 12 (ref 4–21)
Creatinine: 0.7 (ref 0.5–1.1)
GLUCOSE: 103
Potassium: 4.8 (ref 3.4–5.3)
Sodium: 137 (ref 137–147)

## 2017-11-23 LAB — CBC AND DIFFERENTIAL
HEMATOCRIT: 41 (ref 36–46)
HEMOGLOBIN: 13.7 (ref 12.0–16.0)
PLATELETS: 198 (ref 150–399)
WBC: 5.1

## 2017-11-23 LAB — HEPATIC FUNCTION PANEL
AST: 223 — AB (ref 13–35)
Alkaline Phosphatase: 58 (ref 25–125)

## 2017-11-23 LAB — LAB REPORT - SCANNED: ALT: 361

## 2017-11-30 DIAGNOSIS — R748 Abnormal levels of other serum enzymes: Secondary | ICD-10-CM | POA: Diagnosis not present

## 2017-12-03 DIAGNOSIS — J0100 Acute maxillary sinusitis, unspecified: Secondary | ICD-10-CM | POA: Diagnosis not present

## 2018-01-01 DIAGNOSIS — R079 Chest pain, unspecified: Secondary | ICD-10-CM | POA: Diagnosis not present

## 2018-01-01 DIAGNOSIS — R9431 Abnormal electrocardiogram [ECG] [EKG]: Secondary | ICD-10-CM | POA: Diagnosis not present

## 2018-01-01 DIAGNOSIS — R0789 Other chest pain: Secondary | ICD-10-CM | POA: Diagnosis not present

## 2018-01-18 DIAGNOSIS — R0789 Other chest pain: Secondary | ICD-10-CM | POA: Diagnosis not present

## 2018-01-18 DIAGNOSIS — R079 Chest pain, unspecified: Secondary | ICD-10-CM | POA: Diagnosis not present

## 2018-02-18 DIAGNOSIS — S59912A Unspecified injury of left forearm, initial encounter: Secondary | ICD-10-CM | POA: Diagnosis not present

## 2018-02-18 DIAGNOSIS — M79631 Pain in right forearm: Secondary | ICD-10-CM | POA: Diagnosis not present

## 2018-02-18 DIAGNOSIS — S5012XA Contusion of left forearm, initial encounter: Secondary | ICD-10-CM | POA: Diagnosis not present

## 2018-02-18 DIAGNOSIS — S5011XA Contusion of right forearm, initial encounter: Secondary | ICD-10-CM | POA: Diagnosis not present

## 2018-02-18 DIAGNOSIS — S0990XA Unspecified injury of head, initial encounter: Secondary | ICD-10-CM | POA: Diagnosis not present

## 2018-02-23 DIAGNOSIS — S2241XA Multiple fractures of ribs, right side, initial encounter for closed fracture: Secondary | ICD-10-CM | POA: Diagnosis not present

## 2018-03-10 ENCOUNTER — Encounter (HOSPITAL_COMMUNITY): Payer: Self-pay | Admitting: Emergency Medicine

## 2018-03-10 ENCOUNTER — Emergency Department
Admission: EM | Admit: 2018-03-10 | Discharge: 2018-03-10 | Disposition: A | Discharge status: Home / Self Care | Payer: 59 | Source: Home / Self Care | Attending: Family Medicine | Admitting: Family Medicine

## 2018-03-10 ENCOUNTER — Other Ambulatory Visit: Payer: Self-pay

## 2018-03-10 ENCOUNTER — Emergency Department (HOSPITAL_COMMUNITY): Payer: 59

## 2018-03-10 ENCOUNTER — Emergency Department (HOSPITAL_COMMUNITY)
Admission: EM | Admit: 2018-03-10 | Discharge: 2018-03-10 | Disposition: A | Discharge status: Home / Self Care | Payer: 59 | Attending: Emergency Medicine | Admitting: Emergency Medicine

## 2018-03-10 ENCOUNTER — Encounter: Payer: Self-pay | Admitting: *Deleted

## 2018-03-10 DIAGNOSIS — F10129 Alcohol abuse with intoxication, unspecified: Secondary | ICD-10-CM | POA: Diagnosis not present

## 2018-03-10 DIAGNOSIS — R000 Tachycardia, unspecified: Secondary | ICD-10-CM | POA: Diagnosis not present

## 2018-03-10 DIAGNOSIS — F17210 Nicotine dependence, cigarettes, uncomplicated: Secondary | ICD-10-CM | POA: Insufficient documentation

## 2018-03-10 DIAGNOSIS — I10 Essential (primary) hypertension: Secondary | ICD-10-CM | POA: Insufficient documentation

## 2018-03-10 DIAGNOSIS — J45909 Unspecified asthma, uncomplicated: Secondary | ICD-10-CM | POA: Diagnosis not present

## 2018-03-10 DIAGNOSIS — F1010 Alcohol abuse, uncomplicated: Secondary | ICD-10-CM

## 2018-03-10 DIAGNOSIS — Z79899 Other long term (current) drug therapy: Secondary | ICD-10-CM | POA: Insufficient documentation

## 2018-03-10 DIAGNOSIS — F10920 Alcohol use, unspecified with intoxication, uncomplicated: Secondary | ICD-10-CM

## 2018-03-10 DIAGNOSIS — R079 Chest pain, unspecified: Secondary | ICD-10-CM | POA: Diagnosis not present

## 2018-03-10 DIAGNOSIS — S0083XA Contusion of other part of head, initial encounter: Secondary | ICD-10-CM

## 2018-03-10 LAB — COMPREHENSIVE METABOLIC PANEL
ALBUMIN: 4.2 g/dL (ref 3.5–5.0)
ALT: 151 U/L — AB (ref 14–54)
AST: 69 U/L — AB (ref 15–41)
Alkaline Phosphatase: 94 U/L (ref 38–126)
Anion gap: 16 — ABNORMAL HIGH (ref 5–15)
BUN: 7 mg/dL (ref 6–20)
CHLORIDE: 103 mmol/L (ref 101–111)
CO2: 27 mmol/L (ref 22–32)
CREATININE: 0.79 mg/dL (ref 0.44–1.00)
Calcium: 8.9 mg/dL (ref 8.9–10.3)
GFR calc Af Amer: >60 mL/min (ref >60)
GFR calc non Af Amer: >60 mL/min (ref >60)
GLUCOSE: 89 mg/dL (ref 65–99)
Potassium: 4.3 mmol/L (ref 3.5–5.1)
SODIUM: 146 mmol/L — AB (ref 135–145)
Total Bilirubin: 0.6 mg/dL (ref 0.3–1.2)
Total Protein: 8.2 g/dL — ABNORMAL HIGH (ref 6.5–8.1)

## 2018-03-10 LAB — CBC WITH DIFFERENTIAL/PLATELET
Abs Immature Granulocytes: 0 10*3/uL (ref 0.0–0.1)
Basophils Absolute: 0.1 10*3/uL (ref 0.0–0.1)
Basophils Relative: 1 %
EOS ABS: 0.1 10*3/uL (ref 0.0–0.7)
EOS PCT: 1 %
HEMATOCRIT: 42.3 % (ref 36.0–46.0)
HEMOGLOBIN: 13.8 g/dL (ref 12.0–15.0)
Immature Granulocytes: 0 %
LYMPHS ABS: 3.2 10*3/uL (ref 0.7–4.0)
LYMPHS PCT: 48 %
MCH: 31.2 pg (ref 26.0–34.0)
MCHC: 32.6 g/dL (ref 30.0–36.0)
MCV: 95.5 fL (ref 78.0–100.0)
MONOS PCT: 6 %
Monocytes Absolute: 0.4 10*3/uL (ref 0.1–1.0)
Neutro Abs: 2.9 10*3/uL (ref 1.7–7.7)
Neutrophils Relative %: 44 %
Platelets: 549 10*3/uL — ABNORMAL HIGH (ref 150–400)
RBC: 4.43 MIL/uL (ref 3.87–5.11)
RDW: 14.9 % (ref 11.5–15.5)
WBC: 6.7 10*3/uL (ref 4.0–10.5)

## 2018-03-10 LAB — I-STAT TROPONIN, ED: Troponin i, poc: 0 ng/mL (ref 0.00–0.08)

## 2018-03-10 LAB — I-STAT BETA HCG BLOOD, ED (MC, WL, AP ONLY)

## 2018-03-10 LAB — RAPID URINE DRUG SCREEN, HOSP PERFORMED
AMPHETAMINES: NOT DETECTED
Barbiturates: NOT DETECTED
Benzodiazepines: POSITIVE — AB
Cocaine: NOT DETECTED
Opiates: NOT DETECTED
TETRAHYDROCANNABINOL: NOT DETECTED

## 2018-03-10 LAB — ETHANOL: Alcohol, Ethyl (B): 298 mg/dL — ABNORMAL HIGH (ref <10)

## 2018-03-10 MED ORDER — SODIUM CHLORIDE 0.9 % IV BOLUS: 1000.0000 mL | Freq: Once | INTRAVENOUS | Status: DC

## 2018-03-10 MED ORDER — LORAZEPAM 0.5 MG PO TABS
0.5000 mg | ORAL_TABLET | Freq: Once | ORAL | Status: AC
  Administered 2018-03-10: 0.5 mg via ORAL
  Filled 2018-03-10: qty 1

## 2018-03-10 MED ORDER — ONDANSETRON 4 MG PO TBDP
4.0000 mg | ORAL_TABLET | Freq: Once | ORAL | Status: AC
  Administered 2018-03-10: 4 mg via ORAL
  Filled 2018-03-10: qty 1
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
## 2018-03-10 NOTE — ED Notes (Signed)
Pt ambulated to bathroom without any dizziness or issues.

## 2018-03-10 NOTE — Discharge Instructions (Addendum)
Please return for any problem.  Follow-up with your regular doctors as instructed.  Please drink alcohol in moderation.
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
## 2018-03-10 NOTE — ED Provider Notes (Signed)
Patient placed in Quick Look pathway, seen and evaluated   Chief Complaint: Alcohol withdrawal, CP  HPI:  32 y.o. has no history of alcohol abuse who presents for evaluation of withdrawal.  Patient also reports she is having chest pain.  Patient reports her last drink was yesterday.  She thinks it was either wine or vodka cannot remember how much.  Patient reports she has had withdrawal seizures before previously.  Patient reports that she does not know how much she drinks daily.  She states that she will go months without drinking and then go been drinking and drink multiple quantities of alcohol a day.  Patient reports that she started having chest pain  this afternoon and called her husband to take her to the emergency department.  She reports some nausea but denies any vomiting.  Patient denies any other recent sicknesses.  ROS:  Withdrawal, CP  Physical Exam:   Gen: No distress  Neuro: Awake and Alert  Skin: Warm    Focused Exam:  Lungs clear to auscultation bilaterally.  Symmetric chest rise.  No wheezing, rales, rhonchi. Bilateral pupils dilated.    Initiation of care has begun. The patient has been counseled on the process, plan, and necessity for staying for the completion/evaluation, and the remainder of the medical screening examination    Desma Mcgregor 03/10/18 1604    Lacretia Leigh, MD 03/10/18 2259
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
## 2018-03-15 ENCOUNTER — Encounter: Payer: Self-pay | Admitting: Family Medicine

## 2018-03-15 ENCOUNTER — Telehealth: Payer: Self-pay | Admitting: *Deleted

## 2018-03-15 ENCOUNTER — Ambulatory Visit (INDEPENDENT_AMBULATORY_CARE_PROVIDER_SITE_OTHER): Payer: 59 | Admitting: Family Medicine

## 2018-03-15 VITALS — BP 125/86 | HR 84 | Temp 98.6°F | Resp 20 | Ht 63.0 in | Wt 244.5 lb

## 2018-03-15 DIAGNOSIS — R945 Abnormal results of liver function studies: Secondary | ICD-10-CM | POA: Diagnosis not present

## 2018-03-15 DIAGNOSIS — K7030 Alcoholic cirrhosis of liver without ascites: Secondary | ICD-10-CM

## 2018-03-15 DIAGNOSIS — F1010 Alcohol abuse, uncomplicated: Secondary | ICD-10-CM | POA: Diagnosis not present

## 2018-03-15 DIAGNOSIS — F418 Other specified anxiety disorders: Secondary | ICD-10-CM | POA: Diagnosis not present

## 2018-03-15 DIAGNOSIS — S2241XA Multiple fractures of ribs, right side, initial encounter for closed fracture: Secondary | ICD-10-CM

## 2018-03-15 DIAGNOSIS — R7989 Other specified abnormal findings of blood chemistry: Secondary | ICD-10-CM | POA: Insufficient documentation

## 2018-03-15 DIAGNOSIS — Z7689 Persons encountering health services in other specified circumstances: Secondary | ICD-10-CM

## 2018-03-15 HISTORY — DX: Multiple fractures of ribs, right side, initial encounter for closed fracture: S22.41XA

## 2018-03-15 MED ORDER — OMEPRAZOLE 20 MG PO CPDR: 20.0000 mg | DELAYED_RELEASE_CAPSULE | Freq: Every day | ORAL | 0 refills | Status: DC

## 2018-03-15 MED ORDER — GABAPENTIN 300 MG PO CAPS: ORAL_CAPSULE | ORAL | 0 refills | Status: DC

## 2018-03-15 MED ORDER — QUETIAPINE FUMARATE 50 MG PO TABS: ORAL_TABLET | ORAL | 0 refills | Status: DC
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
## 2018-03-15 NOTE — Patient Instructions (Addendum)
Gabapentin - 300 mg ( 1 pill)  every 8 hours.  seroquel 50 mg about 1 hour before bed, the increase to 100 mg before bed in 1 week.  Do not restart prozac or  amitriptyline .   Referral to psychology placed today to help with behavior techniques to quit drinking.  Consider AA meetings, they can be very helpful.  Please schedule appt with Dr. Penelope Coop.   Follow up in  2 weeks , and if urine is free of drugs, we can consider starting the Naloxone, which helps decrease cravings.    Please help [place] help you:  We are honored you have chosen [facility] for your Primary Care home. Below you will find basic instructions that you may need to access in the future. Please help [place] help you by reading the instructions, which cover many of the frequent questions we experience.   Prescription refills and request:  -In order to allow more efficient response time, please call your pharmacy for all refills. They will forward the request electronically to [place]. This allows for the quickest possible response. Request left on a nurse line can take longer to refill, since these are checked as time allows between office patients and other phone calls.  - refill request can take up to 3-5 working days to complete.  - If request is sent electronically and request is appropiate, it is usually completed in 1-2 business days.  - all patients will need to be seen routinely for all chronic medical conditions requiring prescription medications (see follow-up below). If you are overdue for follow up on your condition, you will be asked to make an appointment and we will call in enough medication to cover you until your appointment (up to 30 days).  - all controlled substances will require a face to face visit to request/refill.  - if you desire your prescriptions to go through a new pharmacy, and have an active script at original pharmacy, you will need to call your pharmacy and have scripts transferred to new pharmacy. This  is completed between the pharmacy locations and not by your provider.    Results: If any images or labs were ordered, it can take up to 1 week to get results depending on the test ordered and the lab/facility running and resulting the test. - Normal or stable results, which do not need further discussion, may be released to your mychart immediately with attached note to you. A call may not be generated for normal results. Please make certain to sign up for mychart. If you have questions on how to activate your mychart you can call the front office.  - If your results need further discussion, our office will attempt to contact you via phone, and if unable to reach you after 2 attempts, we will release your abnormal result to your mychart with instructions.  - All results will be automatically released in mychart after 1 week.  - Your provider will provide you with explanation and instruction on all relevant material in your results. Please keep in mind, results and labs may appear confusing or abnormal to the untrained eye, but it does not mean they are actually abnormal for you personally. If you have any questions about your results that are not covered, or you desire more detailed explanation than what was provided, you should make an appointment with your provider to do so.   Our office handles many outgoing and incoming calls daily. If we have not contacted you  within 1 week about your results, please check your mychart to see if there is a message first and if not, then contact our office.  In helping with this matter, you help decrease call volume, and therefore allow [place] to be able to respond to patients needs more efficiently.   Acute office visits (sick visit):  An acute visit is intended for a new problem and are scheduled in shorter time slots to allow schedule openings for patients with new problems. This is the appropriate visit to discuss a new problem. Problems will not be addressed by  phone call or Echart message. Appointment is needed if requesting treatment. In order to provide you with excellent quality medical care with proper time for you to explain your problem, have an exam and receive treatment with instructions, these appointments should be limited to one new problem per visit. If you experience a new problem, in which you desire to be addressed, please make an acute office visit, we save openings on the schedule to accommodate you. Please do not save your new problem for any other type of visit, let us take care of it properly and quickly for you.   Follow up visits:  Depending on your condition(s) your provider will need to see you routinely in order to provide you with quality care and prescribe medication(s). Most chronic conditions (Example: hypertension, Diabetes, depression/anxiety... etc), require visits a couple times a year. Your provider will instruct you on proper follow up for your personal medical conditions and history. Please make certain to make follow up appointments for your condition as instructed. Failing to do so could result in lapse in your medication treatment/refills. If you request a refill, and are overdue to be seen on a condition, we will always provide you with a 30 day script (once) to allow you time to schedule.    Medicare wellness (well visit): - we have a wonderful Nurse Maudie Mercury), that will meet with you and provide you will yearly medicare wellness visits. These visits should occur yearly (can not be scheduled less than 1 calendar year apart) and cover preventive health, immunizations, advance directives and screenings you are entitled to yearly through your medicare benefits. Do not miss out on your entitled benefits, this is when medicare will pay for these benefits to be ordered for you.  These are strongly encouraged by your provider and is the appropriate type of visit to make certain you are up to date with all preventive health benefits. If  you have not had your medicare wellness exam in the last 12 months, please make certain to schedule one by calling the office and schedule your medicare wellness with Maudie Mercury as soon as possible.   Yearly physical (well visit):  - Adults are recommended to be seen yearly for physicals. Check with your insurance and date of your last physical, most insurances require one calendar year between physicals. Physicals include all preventive health topics, screenings, medical exam and labs that are appropriate for gender/age and history. You may have fasting labs needed at this visit. This is a well visit (not a sick visit), new problems should not be covered during this visit (see acute visit).  - Pediatric patients are seen more frequently when they are younger. Your provider will advise you on well child visit timing that is appropriate for your their age. - This is not a medicare wellness visit. Medicare wellness exams do not have an exam portion to the visit. Some medicare companies allow for  a physical, some do not allow a yearly physical. If your medicare allows a yearly physical you can schedule the medicare wellness with our nurse Maudie Mercury and have your physical with your provider after, on the same day. Please check with insurance for your full benefits.   Late Policy/No Shows:  - all new patients should arrive 15-30 minutes earlier than appointment to allow [place] time  to  obtain all personal demographics,  insurance information and for you to complete office paperwork. - All established patients should arrive 10-15 minutes earlier than appointment time to update all information and be checked in .  - In our best efforts to run on time, if you are late for your appointment you will be asked to either reschedule or if able, we will work you back into the schedule. There will be a wait time to work you back in the schedule,  depending on availability.  - If you are unable to make it to your appointment as  scheduled, please call 24 hours ahead of time to allow [place] to fill the time slot with someone else who needs to be seen. If you do not cancel your appointment ahead of time, you may be charged a no show fee.

## 2018-03-15 NOTE — Telephone Encounter (Signed)
New referral placed for her to GI.

## 2018-03-15 NOTE — Telephone Encounter (Signed)
Copied from [place] [phone]. Topic: Referral - Request >> Mar 15, 2018 12:25 PM Conception Chancy, NT wrote: Reason for CRM: patient states she was told by Dr. Raoul Pitch to follow up with her [place] doctor and was just informed she has been dismissed due to missing too many appointments. She is hoping to be referred to a GI doctor within [place]. Please advise.

## 2018-03-26 ENCOUNTER — Ambulatory Visit: Payer: Self-pay | Admitting: Family Medicine

## 2018-03-26 ENCOUNTER — Ambulatory Visit: Payer: 59 | Admitting: Family Medicine

## 2018-03-26 NOTE — Telephone Encounter (Signed)
Protocall states for patient to go to the ed -  Heather at [facility] notified . Patient denies any suicidal ideations she states she is ready and wants to stop drinking.Called patients husband Brandi Kirk after obtaining her permission and advised him she should be taken to er. He agreed- he is at work but he will leave and drive her to the Er. He and the patient were advised that [facility] would be the .best option. Called patient back and she agreed to go to the Er. Patient agrees and states she has a desire to stop drinking.  Reason for Disposition . Feeling very shaky (i.e., visible tremors of hands) . [1] Vomiting AND [2] contains red blood or black ("coffee ground") material  (Exception: few red streaks in vomit that only happened once)  Answer Assessment - Initial Assessment Questions 1. DO YOU DRINK: "Do you drink alcohol, including beer, wine or hard liquor?"       Stopped   2. HOW OFTEN: "How many days per week do you typically drink alcohol?"         Last drink 0730 this am   3. HOW MUCH: "How many drinks do you typically have on days when you drink?" (one drink = 1.5 oz alcohol, 5 oz wine, 12 oz beer)           12 shots liquor   4. MOST: "What is the most that you have had to drink on any one occasion in the last month?"          1 big bottle  5. LAST 24 HOURS: "Have you had a drink within the last 24 hours?"             6  Cups of wine and 1  Beer   6. DRINKING PROBLEM: "Do you have or have you ever had a drinking problem?"          Yes  7. DRUG PROBLEM: "Are you using any other drugs?" (e.g., yes/no; cocaine, prescription medications, etc.)           No  8. SYMPTOMS: "What symptoms are you currently experiencing?" (e.g., none, tremors or shakiness, abdominal pain, vomiting, blackout spells)           Vomiting  Tremors and shakes nausea  And vomiting blood    9. DETOX PROGRAM: "Have you ever gone through a detox program?"            Short detox   10. THERAPIST: "Do you have a  counselor or therapist? Name?"              Yes    11. SUPPORT: "Who is with you now?" "Who do you live with?" "Do you have family or friends nearby who you can talk to?" "Are you a member of Alcoholics Anonymous?"            Nobody is with her now husband and 2 children      68. PREGNANCY: "Is there any chance you are pregnant?" "When was your last menstrual period?"              IUD  Protocols used: ALCOHOL ABUSE AND DEPENDENCE-A-AH

## 2018-03-29 ENCOUNTER — Encounter: Payer: Self-pay | Admitting: *Deleted

## 2018-03-29 ENCOUNTER — Other Ambulatory Visit: Payer: Self-pay

## 2018-03-29 ENCOUNTER — Encounter: Payer: Self-pay | Admitting: Family Medicine

## 2018-03-29 ENCOUNTER — Emergency Department (HOSPITAL_COMMUNITY)
Admission: EM | Admit: 2018-03-29 | Discharge: 2018-03-30 | Disposition: A | Discharge status: Home / Self Care | Payer: 59 | Attending: Emergency Medicine | Admitting: Emergency Medicine

## 2018-03-29 DIAGNOSIS — I10 Essential (primary) hypertension: Secondary | ICD-10-CM | POA: Diagnosis not present

## 2018-03-29 DIAGNOSIS — F10929 Alcohol use, unspecified with intoxication, unspecified: Secondary | ICD-10-CM

## 2018-03-29 DIAGNOSIS — J45909 Unspecified asthma, uncomplicated: Secondary | ICD-10-CM | POA: Insufficient documentation

## 2018-03-29 DIAGNOSIS — R1110 Vomiting, unspecified: Secondary | ICD-10-CM | POA: Diagnosis not present

## 2018-03-29 DIAGNOSIS — R197 Diarrhea, unspecified: Secondary | ICD-10-CM | POA: Diagnosis not present

## 2018-03-29 DIAGNOSIS — F17210 Nicotine dependence, cigarettes, uncomplicated: Secondary | ICD-10-CM | POA: Insufficient documentation

## 2018-03-29 LAB — COMPREHENSIVE METABOLIC PANEL
ALK PHOS: 93 U/L (ref 38–126)
ALT: 72 U/L — ABNORMAL HIGH (ref 0–44)
ANION GAP: 19 — AB (ref 5–15)
AST: 88 U/L — ABNORMAL HIGH (ref 15–41)
Albumin: 4.2 g/dL (ref 3.5–5.0)
BILIRUBIN TOTAL: 1.2 mg/dL (ref 0.3–1.2)
BUN: 7 mg/dL (ref 6–20)
CALCIUM: 9.1 mg/dL (ref 8.9–10.3)
CO2: 22 mmol/L (ref 22–32)
CREATININE: 0.78 mg/dL (ref 0.44–1.00)
Chloride: 101 mmol/L (ref 98–111)
Glucose, Bld: 97 mg/dL (ref 70–99)
Potassium: 3.9 mmol/L (ref 3.5–5.1)
Sodium: 142 mmol/L (ref 135–145)
TOTAL PROTEIN: 8.4 g/dL — AB (ref 6.5–8.1)

## 2018-03-29 LAB — CBC
HCT: 43 % (ref 36.0–46.0)
Hemoglobin: 14.7 g/dL (ref 12.0–15.0)
MCH: 32.5 pg (ref 26.0–34.0)
MCHC: 34.2 g/dL (ref 30.0–36.0)
MCV: 95.1 fL (ref 78.0–100.0)
PLATELETS: 318 10*3/uL (ref 150–400)
RBC: 4.52 MIL/uL (ref 3.87–5.11)
RDW: 14.2 % (ref 11.5–15.5)
WBC: 7.4 10*3/uL (ref 4.0–10.5)

## 2018-03-29 LAB — I-STAT BETA HCG BLOOD, ED (MC, WL, AP ONLY)

## 2018-03-29 LAB — ETHANOL: ALCOHOL ETHYL (B): 280 mg/dL — AB (ref <10)

## 2018-03-29 MED ORDER — LORAZEPAM 1 MG PO TABS: 0.0000 mg | ORAL_TABLET | Freq: Four times a day (QID) | ORAL | Status: DC

## 2018-03-29 MED ORDER — VITAMIN B-1 100 MG PO TABS: 100.0000 mg | ORAL_TABLET | Freq: Every day | ORAL | Status: DC

## 2018-03-29 MED ORDER — LORAZEPAM 1 MG PO TABS: 0.0000 mg | ORAL_TABLET | Freq: Two times a day (BID) | ORAL | Status: DC

## 2018-03-29 MED ORDER — LORAZEPAM 2 MG/ML IJ SOLN
0.0000 mg | Freq: Four times a day (QID) | INTRAMUSCULAR | Status: DC
  Administered 2018-03-29: 2 mg via INTRAVENOUS
  Filled 2018-03-29: qty 1

## 2018-03-29 MED ORDER — SODIUM CHLORIDE 0.9 % IV BOLUS
1000.0000 mL | Freq: Once | INTRAVENOUS | Status: AC
  Administered 2018-03-29: 1000 mL via INTRAVENOUS

## 2018-03-29 MED ORDER — THIAMINE HCL 100 MG/ML IJ SOLN: 100.0000 mg | Freq: Every day | INTRAMUSCULAR | Status: DC

## 2018-03-29 MED ORDER — ONDANSETRON HCL 4 MG/2ML IJ SOLN
4.0000 mg | Freq: Once | INTRAMUSCULAR | Status: AC
  Administered 2018-03-29: 4 mg via INTRAVENOUS
  Filled 2018-03-29: qty 2

## 2018-03-29 MED ORDER — LORAZEPAM 2 MG/ML IJ SOLN: 0.0000 mg | Freq: Two times a day (BID) | INTRAMUSCULAR | Status: DC

## 2018-03-29 NOTE — ED Triage Notes (Signed)
Pt presents to the ED with c/o alcohol withdrawal. Pt's family reports she has had emesis and diarrhea x1 week.  Pt reports she attempted to drink alcohol yesterday and threw it back up.

## 2018-03-29 NOTE — ED Provider Notes (Signed)
[place] DEPT Provider Note   CSN: [id number] Arrival date & time: 03/29/18  2101     History   Chief Complaint Chief Complaint  Patient presents with  . Emesis  . Diarrhea  . Alcohol Intoxication    HPI Brandi Kirk is a 32 y.o. female.  HPI   32 year old female with history of alcohol abuse, bipolar, depression, presenting with concerns for alcohol withdrawal.  History obtained through spouse who is at bedside.  Spine mention patient has had trouble with alcohol abuse for "quite a while" she would go through binging bout and she is currently on a binging history for the past week.  She consumes a large amount of alcohol usually several bottle forms, and hard liquor, last use was today states she tries to quit unsuccessfully.  States she vomits multiple times a day, and having bouts of tremors.  No report of prior seizures.  No thoughts of homicidal or suicidal.  She does endorse occasional visual hallucination with "seeing a man standing in the corner" but no command hallucination.  She has not officially seek for outpatient detox of the past.  Past Medical History:  Diagnosis Date  . Alcohol addiction ([place])   . Bipolar 1 disorder ([place])   . Blood in stool   . Chicken pox   . 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 Depression   . Fractured rib 2019  . Frequent headaches   . Frequent UTI   . GERD (gastroesophageal reflux disease)   . HPV in female   . Hypertension   . Migraine   . PCOS (polycystic ovarian syndrome)   . Vocal cord dysfunction     Patient Active Problem List   Diagnosis Date Noted  . Alcohol use disorder, mild, abuse 03/15/2018  . Multiple closed fractures of ribs of right side 03/15/2018  . Elevated LFTs 03/15/2018  . Depression with anxiety 03/15/2018  . Asthma 11/25/2016  . Cirrhosis with alcoholism [facility])     Past Surgical History:  Procedure Laterality Date  . WISDOM TOOTH EXTRACTION       OB History    Gravida    1   Para  1   Term      Preterm      AB      Living  1     SAB      TAB      Ectopic      Multiple      Live Births               Home Medications    Prior to Admission medications   Medication Sig Start Date End Date Taking? Authorizing Provider  gabapentin (NEURONTIN) 300 MG capsule 300 mg TID 03/15/18   Kuneff, Renee A, DO  levonorgestrel (MIRENA) 20 MCG/24HR IUD 1 each by Intrauterine route once.    [provider]  omeprazole (PRILOSEC) 20 MG capsule Take 1 capsule (20 mg total) by mouth daily. 03/15/18   Kuneff, Renee A, DO  QUEtiapine (SEROQUEL) 50 MG tablet Take 1 tablet (50 mg total) by mouth at bedtime for 7 days, THEN 2 tablets (100 mg total) at bedtime for 21 days. 03/15/18 04/12/18  [name], DO    Family History Family History  Problem Relation Age of Onset  . Diabetes Mother   . Stroke Mother   . Hypertension Mother   . Heart murmur Mother   . Alcohol abuse Mother   . Arthritis Mother   .  Depression Mother   . Early death Mother   . Hyperlipidemia Mother   . Kidney disease Mother   . Mental illness Mother   . Epilepsy Mother   . Rheum arthritis Sister   . Heart murmur Sister   . Alcohol abuse Sister   . Depression Sister   . Heart murmur Brother   . Alcohol abuse Brother   . Depression Brother   . Mental illness Brother   . Alcohol abuse Father   . Depression Father   . Mental illness Father   . Arthritis Maternal Grandmother   . Birth defects Maternal Grandmother   . Hyperlipidemia Maternal Grandmother   . Hyperlipidemia Maternal Grandfather   . Alcohol abuse Maternal Grandfather   . Arthritis Maternal Grandfather   . Diabetes Maternal Grandfather   . Stroke Maternal Grandfather   . Arthritis Paternal Grandmother   . Arthritis Paternal Grandfather   . Birth defects Brother   . Mental illness Brother   . Depression Brother   . Mental illness Brother     Social History Social History   Tobacco Use  .  Smoking status: Current Some Day Smoker    Packs/day: 0.25    Years: 15.00    Pack years: 3.75    Types: Cigarettes  . Smokeless tobacco: Never Used  . Tobacco comment: refused  Substance Use Topics  . Alcohol use: Yes    Comment: Binge drinker  . Drug use: No     Allergies   Ciprofloxacin and Septra [sulfamethoxazole-trimethoprim]   Review of Systems Review of Systems  All other systems reviewed and are negative.    Physical Exam Updated Vital Signs BP (!) 125/99   Pulse (!) 111   Temp 98.6 F (37 C) (Oral)   Resp 18   Ht 5' 4"  (1.626 m)   Wt 108.9 kg (240 lb)   SpO2 100%   BMI 41.20 kg/m   Physical Exam  Constitutional: She appears well-developed and well-nourished. She appears distressed (appears tremulous, minimal interaction, not making eye contact).  HENT:  Head: Atraumatic.  Eyes: Pupils are equal, round, and reactive to light. Conjunctivae and EOM are normal.  Neck: Normal range of motion. Neck supple.  Cardiovascular:  Tachycardic without M/R/G  Pulmonary/Chest:  Shallow breathing without W/R/R  Abdominal: Soft. Bowel sounds are normal. She exhibits no distension. There is no tenderness.  Neurological: She is alert. GCS eye subscore is 4. GCS verbal subscore is 4. GCS motor subscore is 6.  Skin: No rash noted.  Psychiatric: Her mood appears anxious. Her speech is delayed. She is slowed. She expresses no homicidal and no suicidal ideation.  Nursing note and vitals reviewed.    ED Treatments / Results  Labs (all labs ordered are listed, but only abnormal results are displayed) Labs Reviewed  COMPREHENSIVE METABOLIC PANEL - Abnormal; Notable for the following components:      Result Value   Total Protein 8.4 (*)    AST 88 (*)    ALT 72 (*)    Anion gap 19 (*)    All other components within normal limits  ETHANOL - Abnormal; Notable for the following components:   Alcohol, Ethyl (B) 280 (*)    All other components within normal limits  CBC    RAPID URINE DRUG SCREEN, HOSP PERFORMED  I-STAT BETA HCG BLOOD, ED ([facility], [facility], [facility] ONLY)    EKG None  Radiology No results found.  Procedures Procedures (including critical care time)  Medications Ordered in  ED Medications  LORazepam (ATIVAN) injection 0-4 mg (2 mg Intravenous Given 03/29/18 2301)    Or  LORazepam (ATIVAN) tablet 0-4 mg ( Oral See Alternative 03/29/18 2301)  LORazepam (ATIVAN) injection 0-4 mg (has no administration in time range)    Or  LORazepam (ATIVAN) tablet 0-4 mg (has no administration in time range)  thiamine (VITAMIN B-1) tablet 100 mg (has no administration in time range)    Or  thiamine (B-1) injection 100 mg (has no administration in time range)  sodium chloride 0.9 % bolus 1,000 mL (0 mLs Intravenous Stopped 03/30/18 0107)  ondansetron (ZOFRAN) injection 4 mg (4 mg Intravenous Given 03/29/18 2300)  sodium chloride 0.9 % bolus 1,000 mL (1,000 mLs Intravenous New Bag/Given 03/30/18 0154)  promethazine (PHENERGAN) injection 25 mg (25 mg Intravenous Given 03/30/18 0201)     Initial Impression / Assessment and Plan / ED Course  I have reviewed the triage vital signs and the nursing notes.  Pertinent labs & imaging results that were available during my care of the patient were reviewed by me and considered in my medical decision making (see chart for details).     BP 119/70   Pulse 98   Temp 98.6 F (37 C) (Oral)   Resp 18   Ht 5' 4"  (1.626 m)   Wt 108.9 kg (240 lb)   SpO2 95%   BMI 41.20 kg/m    Final Clinical Impressions(s) / ED Diagnoses   Final diagnoses:  Acute alcoholic intoxication with complication [facility])    ED Discharge Orders        Ordered    chlordiazePOXIDE (LIBRIUM) 25 MG capsule     03/30/18 0345     10:43 PM Pt here with sxs of alcohol withdrawal.  Currently on a heavy binge, last drink this AM but reportedly vomit it up.  No SI/HI.  Will place pt on CIWA protocol to prevent alcohol related seizure.  WOrk up initiated. IVF given.    1:42 AM Patient became less tremulous after receiving Ativan via CIWA protocol.  She is still tachycardic with heart rate of 114 after 1 liter of IV fluid.  She still endorse nausea and dry heaving.  We will continue with symptom management.  Her alcohol level is 280.  She is not sober.  3:46 AM Patient became more alert and oriented.  Vital signs stabilized after receiving IV fluid and Ativan.  At this time she is stable to be discharged in the custody of her spouse who is at bedside.  Patient discharged home with a Librium taper course.  Outpatient counseling and therapy recommended, resources provided.  Return precautions discussed.   Domenic Moras, PA-C 03/30/18 4037    Carmin Muskrat, MD 03/30/18 [phone]

## 2018-03-30 MED ORDER — CHLORDIAZEPOXIDE HCL 25 MG PO CAPS: ORAL_CAPSULE | ORAL | 0 refills | Status: DC

## 2018-03-30 MED ORDER — SODIUM CHLORIDE 0.9 % IV BOLUS
1000.0000 mL | Freq: Once | INTRAVENOUS | Status: AC
  Administered 2018-03-30: 1000 mL via INTRAVENOUS

## 2018-03-30 MED ORDER — PROMETHAZINE HCL 25 MG/ML IJ SOLN
25.0000 mg | Freq: Once | INTRAMUSCULAR | Status: AC
  Administered 2018-03-30: 25 mg via INTRAVENOUS
  Filled 2018-03-30: qty 1

## 2018-03-30 NOTE — ED Notes (Signed)
Ice chips given to pt.

## 2018-03-30 NOTE — Discharge Instructions (Signed)
Please use resource provided to seek help for your alcohol dependency.  Take Librium taper medication to help with withdrawal.  Return if you have any concerns.

## 2018-04-05 ENCOUNTER — Ambulatory Visit: Payer: 59 | Admitting: Family Medicine

## 2018-04-08 ENCOUNTER — Other Ambulatory Visit: Payer: Self-pay

## 2018-04-08 DIAGNOSIS — K7030 Alcoholic cirrhosis of liver without ascites: Secondary | ICD-10-CM

## 2018-04-08 DIAGNOSIS — F1010 Alcohol abuse, uncomplicated: Secondary | ICD-10-CM

## 2018-04-09 ENCOUNTER — Ambulatory Visit (INDEPENDENT_AMBULATORY_CARE_PROVIDER_SITE_OTHER): Payer: 59 | Admitting: Family Medicine

## 2018-04-09 ENCOUNTER — Encounter: Payer: Self-pay | Admitting: Family Medicine

## 2018-04-09 VITALS — BP 110/72 | HR 89 | Resp 16 | Ht 63.0 in | Wt 245.0 lb

## 2018-04-09 DIAGNOSIS — F418 Other specified anxiety disorders: Secondary | ICD-10-CM | POA: Diagnosis not present

## 2018-04-09 DIAGNOSIS — F1010 Alcohol abuse, uncomplicated: Secondary | ICD-10-CM | POA: Diagnosis not present

## 2018-04-09 DIAGNOSIS — R7989 Other specified abnormal findings of blood chemistry: Secondary | ICD-10-CM

## 2018-04-09 DIAGNOSIS — R945 Abnormal results of liver function studies: Secondary | ICD-10-CM

## 2018-04-09 DIAGNOSIS — K7030 Alcoholic cirrhosis of liver without ascites: Secondary | ICD-10-CM

## 2018-04-09 LAB — COMPREHENSIVE METABOLIC PANEL
ALT: 99 U/L — AB (ref 0–35)
AST: 40 U/L — ABNORMAL HIGH (ref 0–37)
Albumin: 4.2 g/dL (ref 3.5–5.2)
Alkaline Phosphatase: 77 U/L (ref 39–117)
BUN: 15 mg/dL (ref 6–23)
CO2: 29 meq/L (ref 19–32)
Calcium: 9.2 mg/dL (ref 8.4–10.5)
Chloride: 103 mEq/L (ref 96–112)
Creatinine, Ser: 0.64 mg/dL (ref 0.40–1.20)
GFR: 115.82 mL/min (ref >60.00)
GLUCOSE: 105 mg/dL — AB (ref 70–99)
POTASSIUM: 4.6 meq/L (ref 3.5–5.1)
SODIUM: 138 meq/L (ref 135–145)
Total Bilirubin: 0.7 mg/dL (ref 0.2–1.2)
Total Protein: 7.1 g/dL (ref 6.0–8.3)

## 2018-04-09 MED ORDER — GABAPENTIN 300 MG PO CAPS: 300.0000 mg | ORAL_CAPSULE | Freq: Every day | ORAL | 0 refills | Status: DC

## 2018-04-09 MED ORDER — QUETIAPINE FUMARATE 100 MG PO TABS: ORAL_TABLET | ORAL | 0 refills | Status: DC

## 2018-04-09 MED ORDER — NALTREXONE HCL 50 MG PO TABS: 50.0000 mg | ORAL_TABLET | Freq: Every day | ORAL | 0 refills | Status: DC
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
## 2018-04-09 NOTE — Patient Instructions (Addendum)
Seroquel> 300 mg at night (3 tabs) and 100 mg (1 tab) in the afternoon.  Naltrexone --> start daily. Gabapentin--> keep the same for now, 1 tab at night.  Try AA meetings, they have shown to help you be successful.   You can use advil or aleve for discomfort from ribs.    Alcoholic Liver Disease Alcoholic liver disease happens when the liver does not work the way it should. The condition is caused by drinking too much alcohol for many years. Follow these instructions at home:  Do not drink alcohol.  Take medicines only as told by your doctor.  Take vitamins only as told by your doctor.  Follow any diet instructions that your doctor gave you. You may need to: ? Eat foods that have thiamine. These include whole-wheat cereals, pork, and raw vegetables. ? Eat foods that have folic acid. These include vegetables, fruits, meats, beans, nuts, and dairy foods. ? Eat foods that are high in carbohydrates. These include yogurt, beans, potatoes, and rice. Contact a doctor if:  You have a fever.  You are short of breath.  You have trouble breathing.  You have bright red blood in your poop (stool).  Your poop looks like tar.  You throw up (vomit) blood.  Your skin looks more yellow, pale, or dark.  You get headaches.  You have trouble thinking.  You have trouble balancing or walking. This information is not intended to replace advice given to you by your health care provider. Make sure you discuss any questions you have with your health care provider. Document Released: 07/13/2009 Document Revised: 02/21/2016 Document Reviewed: 08/17/2014 Elsevier Interactive Patient Education  2018 [employer].  Alcohol Withdrawal Alcohol withdrawal is a group of symptoms that can develop when a person who drinks heavily and regularly stops drinking or drinks less. What are the causes? Heavy and regular drinking can cause chemicals that send signals from the brain to the body  (neurotransmitters) to deactivate. Alcohol withdrawal develops when deactivated neurotransmitters reactivate because a person stops drinking or drinks less. What increases the risk? The more a person drinks and the longer he or she drinks, the greater the risk of alcohol withdrawal. Severe withdrawal is more likely to develop in someone who:  Had severe alcohol withdrawal in the past.  Had a seizure during a previous episode of alcohol withdrawal.  Is elderly.  Is pregnant.  Has been abusing drugs.  Has other medical problems, including: ? Infection. ? Heart, lung, or liver disease. ? Seizures. ? Mental health problems.  What are the signs or symptoms? Symptoms of this condition can be mild to moderate, or they can be severe. Mild to moderate symptoms may include:  Fatigue.  Nightmares.  Trouble sleeping.  Depression.  Anxiety.  Inability to think clearly.  Mood swings.  Irritability.  Loss of appetite.  Nausea or vomiting.  Clammy skin.  Extreme sweating.  Rapid heartbeat.  Shakiness.  Uncontrollable shaking (tremor).  Severe symptoms may include:  Fever.  Seizures.  Severeconfusion.  Feeling or seeing things that are not there (hallucinations).  Symptoms usually begin within eight hours after a person stops drinking or drinks less. They can last for weeks. How is this diagnosed? Alcohol withdrawal is diagnosed with a medical history and physical exam. Sometimes, urine and blood tests are also done. How is this treated? Treatment may involve:  Monitoring blood pressure, pulse, and breathing.  Getting fluids through an IV tube.  Medicine to reduce anxiety.  Medicine to prevent  or control seizures.  Multivitamins and B vitamins.  Having a health care provider check on you daily.  If symptoms are moderate to severe or if there is a risk of severe withdrawal, treatment may be done at a hospital or treatment center. Follow these  instructions at home:  Take medicines and vitamin supplements only as directed by your health care provider.  Do not drink alcohol.  Have someone stay with you or be available if you need help.  Drink enough fluid to keep your urine clear or pale yellow.  Consider joining a 12-step program or another alcohol support group. Contact a health care provider if:  Your symptoms get worse or do not go away.  You cannot keep food or water in your stomach.  You are struggling with not drinking alcohol.  You cannot stop drinking alcohol. Get help right away if:  You have an irregular heartbeat.  You have chest pain.  You have trouble breathing.  You have symptoms of severe withdrawal, such as: ? A fever. ? Seizures. ? Severe confusion. ? Hallucinations. This information is not intended to replace advice given to you by your health care provider. Make sure you discuss any questions you have with your health care provider. Document Released: 06/25/2005 Document Revised: 01/23/2016 Document Reviewed: 07/04/2014 Elsevier Interactive Patient Education  [employer].

## 2018-04-14 ENCOUNTER — Encounter: Payer: Self-pay | Admitting: Family Medicine

## 2018-04-15 DIAGNOSIS — S0185XA Open bite of other part of head, initial encounter: Secondary | ICD-10-CM | POA: Diagnosis not present

## 2018-04-19 ENCOUNTER — Encounter: Payer: Self-pay | Admitting: Family Medicine

## 2018-04-19 DIAGNOSIS — R7303 Prediabetes: Secondary | ICD-10-CM | POA: Insufficient documentation

## 2018-04-20 ENCOUNTER — Encounter: Payer: Self-pay | Admitting: Family Medicine

## 2018-04-20 ENCOUNTER — Ambulatory Visit (INDEPENDENT_AMBULATORY_CARE_PROVIDER_SITE_OTHER): Payer: 59 | Admitting: Family Medicine

## 2018-04-20 VITALS — BP 115/81 | HR 84 | Temp 98.2°F | Resp 20 | Ht 63.0 in | Wt 252.5 lb

## 2018-04-20 DIAGNOSIS — K7030 Alcoholic cirrhosis of liver without ascites: Secondary | ICD-10-CM | POA: Diagnosis not present

## 2018-04-20 DIAGNOSIS — Z0001 Encounter for general adult medical examination with abnormal findings: Secondary | ICD-10-CM | POA: Diagnosis not present

## 2018-04-20 DIAGNOSIS — F418 Other specified anxiety disorders: Secondary | ICD-10-CM | POA: Diagnosis not present

## 2018-04-20 DIAGNOSIS — R7303 Prediabetes: Secondary | ICD-10-CM

## 2018-04-20 DIAGNOSIS — Z23 Encounter for immunization: Secondary | ICD-10-CM

## 2018-04-20 DIAGNOSIS — Z6841 Body Mass Index (BMI) 40.0 and over, adult: Secondary | ICD-10-CM

## 2018-04-20 DIAGNOSIS — G4700 Insomnia, unspecified: Secondary | ICD-10-CM | POA: Insufficient documentation

## 2018-04-20 DIAGNOSIS — R7989 Other specified abnormal findings of blood chemistry: Secondary | ICD-10-CM

## 2018-04-20 DIAGNOSIS — R945 Abnormal results of liver function studies: Secondary | ICD-10-CM

## 2018-04-20 DIAGNOSIS — F1010 Alcohol abuse, uncomplicated: Secondary | ICD-10-CM

## 2018-04-20 LAB — HEPATIC FUNCTION PANEL
ALK PHOS: 75 U/L (ref 39–117)
ALT: 28 U/L (ref 0–35)
AST: 14 U/L (ref 0–37)
Albumin: 4 g/dL (ref 3.5–5.2)
BILIRUBIN DIRECT: 0.1 mg/dL (ref 0.0–0.3)
BILIRUBIN TOTAL: 0.4 mg/dL (ref 0.2–1.2)
Total Protein: 7 g/dL (ref 6.0–8.3)

## 2018-04-20 LAB — LIPID PANEL
CHOLESTEROL: 173 mg/dL (ref 0–200)
HDL: 43.7 mg/dL (ref >39.00)
LDL Cholesterol: 107 mg/dL — ABNORMAL HIGH (ref 0–99)
NONHDL: 128.85
Total CHOL/HDL Ratio: 4
Triglycerides: 109 mg/dL (ref 0.0–149.0)
VLDL: 21.8 mg/dL (ref 0.0–40.0)

## 2018-04-20 LAB — TSH: TSH: 2.25 u[IU]/mL (ref 0.35–4.50)

## 2018-04-20 LAB — HEMOGLOBIN A1C: HEMOGLOBIN A1C: 5.3 % (ref 4.6–6.5)

## 2018-04-20 MED ORDER — OMEPRAZOLE 20 MG PO CPDR: 20.0000 mg | DELAYED_RELEASE_CAPSULE | Freq: Every day | ORAL | 5 refills | Status: DC

## 2018-04-20 MED ORDER — GABAPENTIN 300 MG PO CAPS: 600.0000 mg | ORAL_CAPSULE | Freq: Every day | ORAL | 5 refills | Status: DC

## 2018-04-20 MED ORDER — NALTREXONE HCL 50 MG PO TABS: 50.0000 mg | ORAL_TABLET | Freq: Every day | ORAL | 2 refills | Status: DC

## 2018-04-20 MED ORDER — QUETIAPINE FUMARATE 100 MG PO TABS: ORAL_TABLET | ORAL | 2 refills | Status: DC

## 2018-04-20 NOTE — Progress Notes (Signed)
Patient ID: Brandi Kirk, female  DOB: 03-Feb-1986, 32 y.o.   MRN: [id number] Patient Care Team    Relationship Specialty Notifications Start End  [name], DO PCP - General Family Medicine  03/15/18   [name], MD Consulting Physician Obstetrics and Gynecology  03/15/18   Wonda Horner, MD Consulting Physician Gastroenterology  03/15/18     Chief Complaint  Patient presents with  . Annual Exam    Subjective:  Brandi Kirk is a 32 y.o.  Female  present for CPE. All past medical history, surgical history, allergies, family history, immunizations, medications and social history were updated in the electronic medical record today. All recent labs, ED visits and hospitalizations within the last year were reviewed.  Health maintenance:  Colonoscopy: no fhx, screen 52. Referred to GI for elevated lft- alcohol- cirrhosis.  Mammogram: no fhx, screen at 42 Cervical cancer screening: last pap: 2018, per pt completed by: Dr. Philis Pique Immunizations: tdap UTD 03/2018, Influenza  (encouraged yearly), encouraged hep b series with elevated lft/liver disease/alcoholoism.  Infectious disease screening: HIV completed 2018.  Hep C completed today for alcohol/lft and start hep b series.  Assistive device: none Oxygen [facility] Patient has a Dental home. Hospitalizations/ED visits: reviewed  Depression screen [facility] 2/9 04/20/2018 04/09/2018 03/15/2018  Decreased Interest 1 2 0  Down, Depressed, Hopeless 1 3 3   PHQ - 2 Score 2 5 3   Altered sleeping 3 3 3   Tired, decreased energy 3 3 3   Change in appetite 3 3 3   Feeling bad or failure about yourself  - 3 3  Trouble concentrating 1 2 1   Moving slowly or fidgety/restless 1 2 0  Suicidal thoughts 0 0 0  PHQ-9 Score 13 21 16   Difficult doing work/chores Somewhat difficult - -   GAD 7 : Generalized Anxiety Score 04/09/2018  Nervous, Anxious, on Edge 3  Control/stop worrying 3  Worry too much - different things 3  Trouble relaxing 3    Restless 1  Easily annoyed or irritable 3  Afraid - awful might happen 1  Total GAD 7 Score 17     Current Exercise Habits: The patient does not participate in regular exercise at present Exercise limited by: None identified   Immunization History  Administered Date(s) Administered  . Hepatitis B, adult 04/20/2018  . Influenza,inj,Quad PF,6+ Mos 11/26/2016  . Tdap 04/15/2018   Past Medical History:  Diagnosis Date  . Alcohol addiction ([place])   . Back pain 03/18/2016  . Bipolar 1 disorder ([place])   . Blood in stool 07/2017   internal hemorrhoid   . Chicken pox   . 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 Depression   . Fractured rib 2019  . Frequent headaches   . Frequent UTI   . GERD (gastroesophageal reflux disease)   . HPV in female   . Hypertension   . Migraine   . PCOS (polycystic ovarian syndrome)   . Prediabetes   . Vocal cord dysfunction    Allergies  Allergen Reactions  . Ciprofloxacin Other (See Comments)    intolerance  . Septra [Sulfamethoxazole-Trimethoprim] Other (See Comments)    Thrush    Past Surgical History:  Procedure Laterality Date  . WISDOM TOOTH EXTRACTION     Family History  Problem Relation Age of Onset  . Diabetes Mother   . Stroke Mother 46  . Hypertension Mother   . Heart murmur Mother   . Alcohol abuse Mother   . Arthritis Mother   .  Depression Mother   . Early death Mother   . Hyperlipidemia Mother   . Kidney disease Mother   . Mental illness Mother   . Epilepsy Mother   . Rheum arthritis Sister   . Heart murmur Sister   . Alcohol abuse Sister   . Bipolar disorder Sister   . Cirrhosis Sister   . Heart murmur Brother   . Alcohol abuse Brother   . Depression Brother   . Mental illness Brother        bipolar  . Alcohol abuse Father   . Depression Father   . Mental illness Father   . Arthritis Maternal Grandmother   . Birth defects Maternal Grandmother   . Hyperlipidemia Maternal Grandmother   . Hyperlipidemia  Maternal Grandfather   . Alcohol abuse Maternal Grandfather   . Arthritis Maternal Grandfather   . Diabetes Maternal Grandfather   . Stroke Maternal Grandfather   . Arthritis Paternal Grandmother   . Arthritis Paternal Grandfather   . Birth defects Brother   . Mental illness Brother        bipolar  . Depression Brother   . Mental illness Brother    Social History   Socioeconomic History  . Marital status: Married    Spouse name: Not on file  . Number of children: Not on file  . Years of education: Not on file  . Highest education level: Not on file  Occupational History  . Not on file  Social Needs  . Financial resource strain: Not on file  . Food insecurity:    Worry: Not on file    Inability: Not on file  . Transportation needs:    Medical: Not on file    Non-medical: Not on file  Tobacco Use  . Smoking status: Current Some Day Smoker    Packs/day: 0.25    Years: 15.00    Pack years: 3.75    Types: Cigarettes  . Smokeless tobacco: Never Used  . Tobacco comment: refused  Substance and Sexual Activity  . Alcohol use: Not Currently    Comment: Binge drinker  . Drug use: No  . Sexual activity: Yes    Partners: Male    Birth control/protection: IUD  Lifestyle  . Physical activity:    Days per week: Not on file    Minutes per session: Not on file  . Stress: Not on file  Relationships  . Social connections:    Talks on phone: Not on file    Gets together: Not on file    Attends religious service: Not on file    Active member of club or organization: Not on file    Attends meetings of clubs or organizations: Not on file    Relationship status: Not on file  . Intimate partner violence:    Fear of current or ex partner: Not on file    Emotionally abused: Not on file    Physically abused: Not on file    Forced sexual activity: Not on file  Other Topics Concern  . Not on file  Social History Narrative   Married. 1 child.    High school graduate. Has a temp  job.    Alcohol abuse. Smoker.   Smoke alarm in the home   feels safe in her relationships.    Allergies as of 04/20/2018      Reactions   Ciprofloxacin Other (See Comments)   intolerance   Septra [sulfamethoxazole-trimethoprim] Other (See Comments)   Ritta Slot  Medication List        Accurate as of 04/20/18  1:08 PM. Always use your most recent med list.          calcium carbonate 500 MG chewable tablet Commonly known as:  TUMS - dosed in mg elemental calcium Chew 1,000-2,000 tablets by mouth daily as needed for indigestion or heartburn.   gabapentin 300 MG capsule Commonly known as:  NEURONTIN Take 2 capsules (600 mg total) by mouth at bedtime.   levonorgestrel 20 MCG/24HR IUD Commonly known as:  MIRENA 1 each by Intrauterine route once.   naltrexone 50 MG tablet Commonly known as:  DEPADE Take 1 tablet (50 mg total) by mouth daily.   omeprazole 20 MG capsule Commonly known as:  PRILOSEC Take 1 capsule (20 mg total) by mouth daily.   QUEtiapine 100 MG tablet Commonly known as:  SEROQUEL 300 mg (3 tabs) Qhs, 100 mg in the afternoon.   thiamine 100 MG tablet Take 100 mg by mouth daily.       All past medical history, surgical history, allergies, family history, immunizations andmedications were updated in the EMR today and reviewed under the history and medication portions of their EMR.      No results found.   ROS: 14 pt review of systems performed and negative (unless mentioned in an HPI)  Objective: BP 115/81 (BP Location: Left Arm, Patient Position: Sitting, Cuff Size: Large)   Pulse 84   Temp 98.2 F (36.8 C)   Resp 20   Ht 5' 3"  (1.6 m)   Wt 252 lb 8 oz (114.5 kg)   SpO2 97%   BMI 44.73 kg/m  Gen: Afebrile. No acute distress. Nontoxic in appearance, well-developed, well-nourished,  Pleasant, obese, caucasian female. HENT: AT. [place]. Bilateral TM visualized and normal in appearance, normal external auditory canal. MMM, no oral lesions, adequate  dentition. Bilateral nares within normal limits. Throat without erythema, ulcerations or exudates. no Cough on exam, no hoarseness on exam. Eyes:Pupils Equal Round Reactive to light, Extraocular movements intact,  Conjunctiva without redness, discharge or icterus. Neck/lymp/endocrine: Supple,no lymphadenopathy, no thyromegaly CV: RRR no murmur, no edema, +2/4 P posterior tibialis pulses. no carotid bruits. No JVD. Chest: CTAB, no wheeze, rhonchi or crackles. normal Respiratory effort. good Air movement. Abd: Soft. obese. NTND. BS present. no Masses palpated. No hepatosplenomegaly. No rebound tenderness or guarding. Skin: no rashes, purpura or petechiae. Warm and well-perfused. Skin intact. Neuro/Msk:  Normal gait. PERLA. EOMi. Alert. Oriented x3.  Cranial nerves II through XII intact. Muscle strength 5/5 upper/lower extremity. DTRs equal bilaterally. Psych: Normal affect, dress and demeanor. Normal speech. Normal thought content and judgment.  No exam data present  Assessment/plan: Brandi Kirk is a 32 y.o. female present for CPE. Depression with anxiety/insomnia/alcohol use d/o and alcoholic cirrhosis discussed meds: NO BENZO recommended if able to avoid.  - Gabapentin 300--> can increase to 600 mg at night if seroquel 300 is not causing sleep.  - Increase seroquel 200--> 323m QHS, 100 mg at lunch.  - Consider Wellbutrin add on if still elevated screenings.  - naltrexone--> continue  50 mg QD - continue  PPI.  - start B-complex (not just thiamine) - pt was encouraged to consider AA meetings.  - TSH - F/U in 3 months.   Elevated LFTs/alcohol use d/o-cirrhosis.  - She has been referred to [facility] awaiting approval and appt. If not heard from them in the next 2- 4 weeks referral outside of system will be made or  consider liver clinic.  - Hepatitis, Acute - Hepatic function panel - Hepatitis B vaccine adult IM, hep B #2 1 mos- #3 in 6 mos. BMI 40.0-44.9, adult (HCC) Diet and  exercise.  - Lipid panel Alcoholic cirrhosis, unspecified whether ascites present (HCC) - Hepatitis B vaccine adult IM Prediabetes - HgB A1c Encounter for preventive health examination Patient was encouraged to exercise greater than 150 minutes a week. Patient was encouraged to choose a diet filled with fresh fruits and vegetables, and lean meats. AVS provided to patient today for education/recommendation on gender specific health and safety maintenance. Colonoscopy: no fhx, screen 52. Referred to GI for elevated lft- alcohol- cirrhosis.  Mammogram: no fhx, screen at 42 Cervical cancer screening: last pap: 2018, per pt completed by: Dr. Philis Pique Immunizations: tdap UTD 03/2018, Influenza  (encouraged yearly), encouraged hep b series with elevated lft/liver disease/alcoholoism.  Infectious disease screening: HIV completed 2018.  Hep C completed today for alcohol/lft and start hep b series.   Return in about 1 year (around 04/21/2019) for CPE. 3 months for [facility].   * refills provided today on 30d scripts with refills 2/2 to insurance  Electronically signed by: Howard Pouch, [place]

## 2018-04-20 NOTE — Patient Instructions (Addendum)
Increase seroquel to 300 mg a night (3 tabs), if after a week you are still having difficulty sleeping can increase to gabapentin to 600 mg also (2 tabs) .   Start xyzal before bed for allergies, it is an antihistamine.   Follow up in 3 months.    Health Maintenance, Female Adopting a healthy lifestyle and getting preventive care can go a long way to promote health and wellness. Talk with your health care provider about what schedule of regular examinations is right for you. This is a good chance for you to check in with your provider about disease prevention and staying healthy. In between checkups, there are plenty of things you can do on your own. Experts have done a lot of research about which lifestyle changes and preventive measures are most likely to keep you healthy. Ask your health care provider for more information. Weight and diet Eat a healthy diet  Be sure to include plenty of vegetables, fruits, low-fat dairy products, and lean protein.  Do not eat a lot of foods high in solid fats, added sugars, or salt.  Get regular exercise. This is one of the most important things you can do for your health. ? Most adults should exercise for at least 150 minutes each week. The exercise should increase your heart rate and make you sweat (moderate-intensity exercise). ? Most adults should also do strengthening exercises at least twice a week. This is in addition to the moderate-intensity exercise.  Maintain a healthy weight  Body mass index (BMI) is a measurement that can be used to identify possible weight problems. It estimates body fat based on height and weight. Your health care provider can help determine your BMI and help you achieve or maintain a healthy weight.  For females 78 years of age and older: ? A BMI below 18.5 is considered underweight. ? A BMI of 18.5 to 24.9 is normal. ? A BMI of 25 to 29.9 is considered overweight. ? A BMI of 30 and above is considered obese.  Watch  levels of cholesterol and blood lipids  You should start having your blood tested for lipids and cholesterol at 30 years of age, then have this test every 5 years.  You may need to have your cholesterol levels checked more often if: ? Your lipid or cholesterol levels are high. ? You are older than 30 years of age. ? You are at high risk for heart disease.  Cancer screening Lung Cancer  Lung cancer screening is recommended for adults 55-29 years old who are at high risk for lung cancer because of a history of smoking.  A yearly low-dose CT scan of the lungs is recommended for people who: ? Currently smoke. ? Have quit within the past 15 years. ? Have at least a 30-pack-year history of smoking. A pack year is smoking an average of one pack of cigarettes a day for 1 year.  Yearly screening should continue until it has been 15 years since you quit.  Yearly screening should stop if you develop a health problem that would prevent you from having lung cancer treatment.  Breast Cancer  Practice breast self-awareness. This means understanding how your breasts normally appear and feel.  It also means doing regular breast self-exams. Let your health care provider know about any changes, no matter how small.  If you are in your 20s or 30s, you should have a clinical breast exam (CBE) by a health care provider every 1-3 years as part  of a regular health exam.  If you are 71 or older, have a CBE every year. Also consider having a breast X-ray (mammogram) every year.  If you have a family history of breast cancer, talk to your health care provider about genetic screening.  If you are at high risk for breast cancer, talk to your health care provider about having an MRI and a mammogram every year.  Breast cancer gene (BRCA) assessment is recommended for women who have family members with BRCA-related cancers. BRCA-related cancers include: ? Breast. ? Ovarian. ? Tubal. ? Peritoneal  cancers.  Results of the assessment will determine the need for genetic counseling and BRCA1 and BRCA2 testing.  Cervical Cancer Your health care provider may recommend that you be screened regularly for cancer of the pelvic organs (ovaries, uterus, and vagina). This screening involves a pelvic examination, including checking for microscopic changes to the surface of your cervix (Pap test). You may be encouraged to have this screening done every 3 years, beginning at age 6.  For women ages 102-65, health care providers may recommend pelvic exams and Pap testing every 3 years, or they may recommend the Pap and pelvic exam, combined with testing for human papilloma virus (HPV), every 5 years. Some types of HPV increase your risk of cervical cancer. Testing for HPV may also be done on women of any age with unclear Pap test results.  Other health care providers may not recommend any screening for nonpregnant women who are considered low risk for pelvic cancer and who do not have symptoms. Ask your health care provider if a screening pelvic exam is right for you.  If you have had past treatment for cervical cancer or a condition that could lead to cancer, you need Pap tests and screening for cancer for at least 20 years after your treatment. If Pap tests have been discontinued, your risk factors (such as having a new sexual partner) need to be reassessed to determine if screening should resume. Some women have medical problems that increase the chance of getting cervical cancer. In these cases, your health care provider may recommend more frequent screening and Pap tests.  Colorectal Cancer  This type of cancer can be detected and often prevented.  Routine colorectal cancer screening usually begins at 30 years of age and continues through 30 years of age.  Your health care provider may recommend screening at an earlier age if you have risk factors for colon cancer.  Your health care provider may also  recommend using home test kits to check for hidden blood in the stool.  A small camera at the end of a tube can be used to examine your colon directly (sigmoidoscopy or colonoscopy). This is done to check for the earliest forms of colorectal cancer.  Routine screening usually begins at age 62.  Direct examination of the colon should be repeated every 5-10 years through 30 years of age. However, you may need to be screened more often if early forms of precancerous polyps or small growths are found.  Skin Cancer  Check your skin from head to toe regularly.  Tell your health care provider about any new moles or changes in moles, especially if there is a change in a mole's shape or color.  Also tell your health care provider if you have a mole that is larger than the size of a pencil eraser.  Always use sunscreen. Apply sunscreen liberally and repeatedly throughout the day.  Protect yourself by wearing long  sleeves, pants, a wide-brimmed hat, and sunglasses whenever you are outside.  Heart disease, diabetes, and high blood pressure  High blood pressure causes heart disease and increases the risk of stroke. High blood pressure is more likely to develop in: ? People who have blood pressure in the high end of the normal range (130-139/85-89 mm Hg). ? People who are overweight or obese. ? People who are African American.  If you are 13-18 years of age, have your blood pressure checked every 3-5 years. If you are 83 years of age or older, have your blood pressure checked every year. You should have your blood pressure measured twice-once when you are at a hospital or clinic, and once when you are not at a hospital or clinic. Record the average of the two measurements. To check your blood pressure when you are not at a hospital or clinic, you can use: ? An automated blood pressure machine at a pharmacy. ? A home blood pressure monitor.  If you are between 63 years and 64 years old, ask your  health care provider if you should take aspirin to prevent strokes.  Have regular diabetes screenings. This involves taking a blood sample to check your fasting blood sugar level. ? If you are at a normal weight and have a low risk for diabetes, have this test once every three years after 30 years of age. ? If you are overweight and have a high risk for diabetes, consider being tested at a younger age or more often. Preventing infection Hepatitis B  If you have a higher risk for hepatitis B, you should be screened for this virus. You are considered at high risk for hepatitis B if: ? You were born in a country where hepatitis B is common. Ask your health care provider which countries are considered high risk. ? Your parents were born in a high-risk country, and you have not been immunized against hepatitis B (hepatitis B vaccine). ? You have HIV or AIDS. ? You use needles to inject street drugs. ? You live with someone who has hepatitis B. ? You have had sex with someone who has hepatitis B. ? You get hemodialysis treatment. ? You take certain medicines for conditions, including cancer, organ transplantation, and autoimmune conditions.  Hepatitis C  Blood testing is recommended for: ? Everyone born from [birth year] through 1963. ? Anyone with known risk factors for hepatitis C.  Sexually transmitted infections (STIs)  You should be screened for sexually transmitted infections (STIs) including gonorrhea and chlamydia if: ? You are sexually active and are younger than 30 years of age. ? You are older than 30 years of age and your health care provider tells you that you are at risk for this type of infection. ? Your sexual activity has changed since you were last screened and you are at an increased risk for chlamydia or gonorrhea. Ask your health care provider if you are at risk.  If you do not have HIV, but are at risk, it may be recommended that you take a prescription medicine daily to  prevent HIV infection. This is called pre-exposure prophylaxis (PrEP). You are considered at risk if: ? You are sexually active and do not regularly use condoms or know the HIV status of your partner(s). ? You take drugs by injection. ? You are sexually active with a partner who has HIV.  Talk with your health care provider about whether you are at high risk of being infected with HIV. If  you choose to begin PrEP, you should first be tested for HIV. You should then be tested every 3 months for as long as you are taking PrEP. Pregnancy  If you are premenopausal and you may become pregnant, ask your health care provider about preconception counseling.  If you may become pregnant, take 400 to 800 micrograms (mcg) of folic acid every day.  If you want to prevent pregnancy, talk to your health care provider about birth control (contraception). Osteoporosis and menopause  Osteoporosis is a disease in which the bones lose minerals and strength with aging. This can result in serious bone fractures. Your risk for osteoporosis can be identified using a bone density scan.  If you are 66 years of age or older, or if you are at risk for osteoporosis and fractures, ask your health care provider if you should be screened.  Ask your health care provider whether you should take a calcium or vitamin D supplement to lower your risk for osteoporosis.  Menopause may have certain physical symptoms and risks.  Hormone replacement therapy may reduce some of these symptoms and risks. Talk to your health care provider about whether hormone replacement therapy is right for you. Follow these instructions at home:  Schedule regular health, dental, and eye exams.  Stay current with your immunizations.  Do not use any tobacco products including cigarettes, chewing tobacco, or electronic cigarettes.  If you are pregnant, do not drink alcohol.  If you are breastfeeding, limit how much and how often you drink  alcohol.  Limit alcohol intake to no more than 1 drink per day for nonpregnant women. One drink equals 12 ounces of beer, 5 ounces of wine, or 1 ounces of hard liquor.  Do not use street drugs.  Do not share needles.  Ask your health care provider for help if you need support or information about quitting drugs.  Tell your health care provider if you often feel depressed.  Tell your health care provider if you have ever been abused or do not feel safe at home. This information is not intended to replace advice given to you by your health care provider. Make sure you discuss any questions you have with your health care provider. Document Released: 03/31/2011 Document Revised: 02/21/2016 Document Reviewed: 06/19/2015 Elsevier Interactive Patient Education  [employer].

## 2018-04-21 LAB — HEPATITIS PANEL, ACUTE
HEP A IGM: NONREACTIVE
HEP B C IGM: NONREACTIVE
HEP B S AG: NONREACTIVE
Hepatitis C Ab: NONREACTIVE
SIGNAL TO CUT-OFF: 0.02 (ref <1.00)

## 2018-04-23 DIAGNOSIS — E662 Morbid (severe) obesity with alveolar hypoventilation: Secondary | ICD-10-CM | POA: Insufficient documentation

## 2018-04-23 DIAGNOSIS — Z6841 Body Mass Index (BMI) 40.0 and over, adult: Secondary | ICD-10-CM

## 2018-04-27 ENCOUNTER — Other Ambulatory Visit: Payer: Self-pay

## 2018-04-27 ENCOUNTER — Encounter (HOSPITAL_COMMUNITY): Payer: Self-pay | Admitting: Emergency Medicine

## 2018-04-27 ENCOUNTER — Emergency Department (HOSPITAL_COMMUNITY)
Admission: EM | Admit: 2018-04-27 | Discharge: 2018-04-27 | Disposition: A | Discharge status: Home / Self Care | Payer: 59 | Attending: Emergency Medicine | Admitting: Emergency Medicine

## 2018-04-27 DIAGNOSIS — F17210 Nicotine dependence, cigarettes, uncomplicated: Secondary | ICD-10-CM | POA: Diagnosis not present

## 2018-04-27 DIAGNOSIS — Y908 Blood alcohol level of 240 mg/100 ml or more: Secondary | ICD-10-CM | POA: Insufficient documentation

## 2018-04-27 DIAGNOSIS — J45909 Unspecified asthma, uncomplicated: Secondary | ICD-10-CM | POA: Diagnosis not present

## 2018-04-27 DIAGNOSIS — Z79899 Other long term (current) drug therapy: Secondary | ICD-10-CM | POA: Diagnosis not present

## 2018-04-27 DIAGNOSIS — I10 Essential (primary) hypertension: Secondary | ICD-10-CM | POA: Insufficient documentation

## 2018-04-27 DIAGNOSIS — F1010 Alcohol abuse, uncomplicated: Secondary | ICD-10-CM

## 2018-04-27 DIAGNOSIS — F10129 Alcohol abuse with intoxication, unspecified: Secondary | ICD-10-CM | POA: Diagnosis not present

## 2018-04-27 DIAGNOSIS — F10929 Alcohol use, unspecified with intoxication, unspecified: Secondary | ICD-10-CM | POA: Diagnosis present

## 2018-04-27 LAB — URINALYSIS, ROUTINE W REFLEX MICROSCOPIC
BILIRUBIN URINE: NEGATIVE
Glucose, UA: NEGATIVE mg/dL
KETONES UR: NEGATIVE mg/dL
Leukocytes, UA: NEGATIVE
NITRITE: NEGATIVE
PH: 6 (ref 5.0–8.0)
Protein, ur: NEGATIVE mg/dL
SPECIFIC GRAVITY, URINE: 1.003 — AB (ref 1.005–1.030)

## 2018-04-27 LAB — COMPREHENSIVE METABOLIC PANEL
ALK PHOS: 89 U/L (ref 38–126)
ALT: 56 U/L — AB (ref 0–44)
ANION GAP: 18 — AB (ref 5–15)
AST: 52 U/L — ABNORMAL HIGH (ref 15–41)
Albumin: 4.3 g/dL (ref 3.5–5.0)
BILIRUBIN TOTAL: 1.1 mg/dL (ref 0.3–1.2)
BUN: 7 mg/dL (ref 6–20)
CALCIUM: 8.8 mg/dL — AB (ref 8.9–10.3)
CO2: 23 mmol/L (ref 22–32)
CREATININE: 0.68 mg/dL (ref 0.44–1.00)
Chloride: 99 mmol/L (ref 98–111)
Glucose, Bld: 113 mg/dL — ABNORMAL HIGH (ref 70–99)
Potassium: 4.3 mmol/L (ref 3.5–5.1)
Sodium: 140 mmol/L (ref 135–145)
TOTAL PROTEIN: 8.4 g/dL — AB (ref 6.5–8.1)

## 2018-04-27 LAB — CBC
HCT: 39.9 % (ref 36.0–46.0)
Hemoglobin: 13.7 g/dL (ref 12.0–15.0)
MCH: 31.4 pg (ref 26.0–34.0)
MCHC: 34.3 g/dL (ref 30.0–36.0)
MCV: 91.5 fL (ref 78.0–100.0)
PLATELETS: 387 10*3/uL (ref 150–400)
RBC: 4.36 MIL/uL (ref 3.87–5.11)
RDW: 13.8 % (ref 11.5–15.5)
WBC: 7.7 10*3/uL (ref 4.0–10.5)

## 2018-04-27 LAB — I-STAT BETA HCG BLOOD, ED (MC, WL, AP ONLY): I-stat hCG, quantitative: <5 m[IU]/mL (ref <5)

## 2018-04-27 LAB — LIPASE, BLOOD: LIPASE: 22 U/L (ref 11–51)

## 2018-04-27 LAB — ETHANOL: Alcohol, Ethyl (B): 316 mg/dL (ref <10)

## 2018-04-27 MED ORDER — LORAZEPAM 2 MG/ML IJ SOLN
1.0000 mg | Freq: Once | INTRAMUSCULAR | Status: AC
  Administered 2018-04-27: 1 mg via INTRAVENOUS
  Filled 2018-04-27: qty 1

## 2018-04-27 MED ORDER — GI COCKTAIL ~~LOC~~
30.0000 mL | Freq: Once | ORAL | Status: AC
  Administered 2018-04-27: 30 mL via ORAL
  Filled 2018-04-27: qty 30

## 2018-04-27 MED ORDER — SODIUM CHLORIDE 0.9 % IV BOLUS
1000.0000 mL | Freq: Once | INTRAVENOUS | Status: AC
  Administered 2018-04-27: 1000 mL via INTRAVENOUS

## 2018-04-27 MED ORDER — ONDANSETRON HCL 4 MG/2ML IJ SOLN
4.0000 mg | Freq: Once | INTRAMUSCULAR | Status: AC
Start: 2018-04-27 — End: 2018-04-27
  Administered 2018-04-27: 4 mg via INTRAVENOUS
  Filled 2018-04-27: qty 2

## 2018-04-27 NOTE — ED Notes (Signed)
Patient ambulated to bathroom with 1 assist. Husband at bedside. Patient eating ice chips. MD aware

## 2018-04-27 NOTE — ED Notes (Signed)
RN notified of abnormal labs 

## 2018-04-27 NOTE — ED Notes (Signed)
Bed: [facility] Expected date:  Expected time:  Means of arrival:  Comments: 

## 2018-04-27 NOTE — ED Notes (Signed)
Patients husband Dashanae Longfield), [phone]. Attempted to call x1.

## 2018-04-27 NOTE — ED Triage Notes (Signed)
Patient BIB family, reports binge drinking x 3 days after recent relapse. States last drink was wine this morning. C/o N/V x4 days. Denies drug use.

## 2018-04-27 NOTE — ED Notes (Signed)
Contacted patients husband Tyshay Adee). Husband is leaving work to come get the son (minor).

## 2018-04-27 NOTE — ED Notes (Signed)
MD aware of patients alcohol level-316.

## 2018-04-27 NOTE — ED Notes (Signed)
Patient stating she wants to go home. Patient attempted to get out of bed. This Probation officer redirected patient to lay back down. Son and step daughter at bedside. Kids are encouraging patient to stay. Patient asking for husband and called husband.

## 2018-04-27 NOTE — ED Notes (Signed)
Patient still asking to go home and kids are still encouraging patient to stay and get help.

## 2018-04-27 NOTE — ED Provider Notes (Signed)
[facility] DEPT Provider Note   CSN: [id number] Arrival date & time: 04/27/18  1105     History   Chief Complaint Chief Complaint  Patient presents with  . Emesis  . Alcohol Problem    HPI Brandi Kirk is a 32 y.o. female.  32 year old female with prior history as detailed below presents for alcohol intoxication.  Patient reportedly has a long-standing history of alcohol use and abuse.  Patient reportedly has been drinking more in the last several days.  Patient's family brought her to the ED after he was too intoxicated to walk at home today.  Patient denies any specific complaint.  She appears to be intoxicated.  The history is provided by the patient, medical records and the spouse.  Alcohol Intoxication  This is a chronic problem. The current episode started 6 to 12 hours ago. The problem occurs daily. The problem has not changed since onset.Pertinent negatives include no chest pain, no abdominal pain, no headaches and no shortness of breath. Nothing aggravates the symptoms. Nothing relieves the symptoms. She has tried nothing for the symptoms.    Past Medical History:  Diagnosis Date  . Alcohol addiction ([place])   . Back pain 03/18/2016  . Bipolar 1 disorder ([place])   . Blood in stool 07/2017   internal hemorrhoid   . Chicken pox   . 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 Depression   . Fractured rib 2019  . Frequent headaches   . Frequent UTI   . GERD (gastroesophageal reflux disease)   . HPV in female   . Hypertension   . Migraine   . PCOS (polycystic ovarian syndrome)   . Prediabetes   . Vocal cord dysfunction     Patient Active Problem List   Diagnosis Date Noted  . Class 3 severe obesity due to excess calories with body mass index (BMI) of 40.0 to 44.9 in adult ([place]) 04/23/2018  . Insomnia 04/20/2018  . Prediabetes   . Alcohol use disorder, mild, abuse 03/15/2018  . Multiple closed fractures of ribs of right side 03/15/2018  .  Elevated LFTs 03/15/2018  . Depression with anxiety 03/15/2018  . Asthma 11/25/2016  . Cirrhosis with alcoholism [facility])     Past Surgical History:  Procedure Laterality Date  . WISDOM TOOTH EXTRACTION       OB History    Gravida  1   Para  1   Term      Preterm      AB      Living  1     SAB      TAB      Ectopic      Multiple      Live Births               Home Medications    Prior to Admission medications   Medication Sig Start Date End Date Taking? Authorizing Provider  calcium carbonate (TUMS - DOSED IN MG ELEMENTAL CALCIUM) 500 MG chewable tablet Chew 1,000-2,000 tablets by mouth daily as needed for indigestion or heartburn.   Yes [provider]  gabapentin (NEURONTIN) 300 MG capsule Take 2 capsules (600 mg total) by mouth at bedtime. 04/20/18  Yes Kuneff, Renee A, DO  levonorgestrel (MIRENA) 20 MCG/24HR IUD 1 each by Intrauterine route once.   Yes [provider]  omeprazole (PRILOSEC) 20 MG capsule Take 1 capsule (20 mg total) by mouth daily. 04/20/18  Yes Kuneff, Renee A, DO  ondansetron (  ZOFRAN-ODT) 8 MG disintegrating tablet Take by mouth every 8 (eight) hours as needed for nausea or vomiting.   Yes [provider]  QUEtiapine (SEROQUEL) 100 MG tablet 300 mg (3 tabs) Qhs, 100 mg in the afternoon. 04/20/18  Yes Kuneff, Renee A, DO  thiamine 100 MG tablet Take 100 mg by mouth daily.   Yes [provider]  naltrexone (DEPADE) 50 MG tablet Take 1 tablet (50 mg total) by mouth daily. Patient not taking: Reported on 04/27/2018 04/20/18   [name], DO    Family History Family History  Problem Relation Age of Onset  . Diabetes Mother   . Stroke Mother 70  . Hypertension Mother   . Heart murmur Mother   . Alcohol abuse Mother   . Arthritis Mother   . Depression Mother   . Early death Mother   . Hyperlipidemia Mother   . Kidney disease Mother   . Mental illness Mother   . Epilepsy Mother   . Rheum arthritis  Sister   . Heart murmur Sister   . Alcohol abuse Sister   . Bipolar disorder Sister   . Cirrhosis Sister   . Heart murmur Brother   . Alcohol abuse Brother   . Depression Brother   . Mental illness Brother        bipolar  . Alcohol abuse Father   . Depression Father   . Mental illness Father   . Arthritis Maternal Grandmother   . Birth defects Maternal Grandmother   . Hyperlipidemia Maternal Grandmother   . Hyperlipidemia Maternal Grandfather   . Alcohol abuse Maternal Grandfather   . Arthritis Maternal Grandfather   . Diabetes Maternal Grandfather   . Stroke Maternal Grandfather   . Arthritis Paternal Grandmother   . Arthritis Paternal Grandfather   . Birth defects Brother   . Mental illness Brother        bipolar  . Depression Brother   . Mental illness Brother     Social History Social History   Tobacco Use  . Smoking status: Current Some Day Smoker    Packs/day: 0.25    Years: 15.00    Pack years: 3.75    Types: Cigarettes  . Smokeless tobacco: Never Used  . Tobacco comment: refused  Substance Use Topics  . Alcohol use: Yes    Comment: Binge drinker  . Drug use: No     Allergies   Ciprofloxacin and Septra [sulfamethoxazole-trimethoprim]   Review of Systems Review of Systems  Respiratory: Negative for shortness of breath.   Cardiovascular: Negative for chest pain.  Gastrointestinal: Negative for abdominal pain.  Neurological: Negative for headaches.  All other systems reviewed and are negative.    Physical Exam Updated Vital Signs BP 136/82 (BP Location: Right Arm)   Pulse 99   Temp 98.7 F (37.1 C) (Oral)   Resp 17   SpO2 96%   Physical Exam  Constitutional: She is oriented to person, place, and time. She appears well-developed and well-nourished. No distress.  Appears intoxicated  HENT:  Head: Normocephalic and atraumatic.  Mouth/Throat: Oropharynx is clear and moist.  Eyes: Pupils are equal, round, and reactive to light. Conjunctivae  and EOM are normal.  Neck: Normal range of motion. Neck supple.  Cardiovascular: Normal rate, regular rhythm and normal heart sounds.  Pulmonary/Chest: Effort normal and breath sounds normal. No respiratory distress.  Abdominal: Soft. She exhibits no distension. There is no tenderness.  Musculoskeletal: Normal range of motion. She exhibits no edema  or deformity.  Neurological: She is alert and oriented to person, place, and time.  Skin: Skin is warm and dry.  Psychiatric: She has a normal mood and affect.  Nursing note and vitals reviewed.    ED Treatments / Results  Labs (all labs ordered are listed, but only abnormal results are displayed) Labs Reviewed  COMPREHENSIVE METABOLIC PANEL - Abnormal; Notable for the following components:      Result Value   Glucose, Bld 113 (*)    Calcium 8.8 (*)    Total Protein 8.4 (*)    AST 52 (*)    ALT 56 (*)    Anion gap 18 (*)    All other components within normal limits  URINALYSIS, ROUTINE W REFLEX MICROSCOPIC - Abnormal; Notable for the following components:   Color, Urine STRAW (*)    Specific Gravity, Urine 1.003 (*)    Hgb urine dipstick SMALL (*)    Bacteria, UA RARE (*)    All other components within normal limits  ETHANOL - Abnormal; Notable for the following components:   Alcohol, Ethyl (B) 316 (*)    All other components within normal limits  LIPASE, BLOOD  CBC  I-STAT BETA HCG BLOOD, ED ([facility], [facility], [facility] ONLY)    EKG None  Radiology No results found.  Procedures Procedures (including critical care time)  Medications Ordered in ED Medications  sodium chloride 0.9 % bolus 1,000 mL (0 mLs Intravenous Stopped 04/27/18 1313)  LORazepam (ATIVAN) injection 1 mg (1 mg Intravenous Given 04/27/18 1224)  ondansetron (ZOFRAN) injection 4 mg (4 mg Intravenous Given 04/27/18 1224)  gi cocktail (Maalox,Lidocaine,Donnatal) (30 mLs Oral Given 04/27/18 1318)     Initial Impression / Assessment and Plan / ED Course  I have reviewed the  triage vital signs and the nursing notes.  Pertinent labs & imaging results that were available during my care of the patient were reviewed by me and considered in my medical decision making (see chart for details).     MDM  Screen complete  Patient is presenting for evaluation of alcohol intoxication.  Patient's screening labs do not reveal any other significant abnormality.  Patient was observed in the ED and appears to be more comfortable and aware after observation and prior to discharge.    Patient's spouse desires to take the patient home.  He assumes care of her in her intoxicated state.  He understands that he should watch her carefully until she is sober.  Close follow up is advised. Strict return precautions given and understood.   Final Clinical Impressions(s) / ED Diagnoses   Final diagnoses:  Alcohol abuse    ED Discharge Orders    None       Valarie Merino, MD 04/27/18 1440

## 2018-04-27 NOTE — Discharge Instructions (Addendum)
Please return for any problem.  Follow-up with your regular doctor as instructed.  Drink alcohol in moderation.

## 2018-04-28 ENCOUNTER — Emergency Department (HOSPITAL_COMMUNITY)
Admission: EM | Admit: 2018-04-28 | Discharge: 2018-04-29 | Disposition: A | Discharge status: Home / Self Care | Payer: 59 | Attending: Emergency Medicine | Admitting: Emergency Medicine

## 2018-04-28 ENCOUNTER — Other Ambulatory Visit: Payer: Self-pay

## 2018-04-28 DIAGNOSIS — Z79899 Other long term (current) drug therapy: Secondary | ICD-10-CM | POA: Insufficient documentation

## 2018-04-28 DIAGNOSIS — J45909 Unspecified asthma, uncomplicated: Secondary | ICD-10-CM | POA: Insufficient documentation

## 2018-04-28 DIAGNOSIS — F10220 Alcohol dependence with intoxication, uncomplicated: Secondary | ICD-10-CM | POA: Diagnosis present

## 2018-04-28 DIAGNOSIS — F17210 Nicotine dependence, cigarettes, uncomplicated: Secondary | ICD-10-CM | POA: Diagnosis not present

## 2018-04-28 DIAGNOSIS — I10 Essential (primary) hypertension: Secondary | ICD-10-CM | POA: Insufficient documentation

## 2018-04-28 DIAGNOSIS — F10920 Alcohol use, unspecified with intoxication, uncomplicated: Secondary | ICD-10-CM

## 2018-04-28 LAB — CBC
HCT: 38.9 % (ref 36.0–46.0)
HEMOGLOBIN: 13.4 g/dL (ref 12.0–15.0)
MCH: 31.7 pg (ref 26.0–34.0)
MCHC: 34.4 g/dL (ref 30.0–36.0)
MCV: 92 fL (ref 78.0–100.0)
Platelets: 349 10*3/uL (ref 150–400)
RBC: 4.23 MIL/uL (ref 3.87–5.11)
RDW: 13.8 % (ref 11.5–15.5)
WBC: 8.1 10*3/uL (ref 4.0–10.5)

## 2018-04-28 LAB — COMPREHENSIVE METABOLIC PANEL
ALT: 70 U/L — ABNORMAL HIGH (ref 0–44)
ANION GAP: 14 (ref 5–15)
AST: 68 U/L — ABNORMAL HIGH (ref 15–41)
Albumin: 4.4 g/dL (ref 3.5–5.0)
Alkaline Phosphatase: 90 U/L (ref 38–126)
BILIRUBIN TOTAL: 1.1 mg/dL (ref 0.3–1.2)
BUN: 7 mg/dL (ref 6–20)
CO2: 24 mmol/L (ref 22–32)
Calcium: 8.4 mg/dL — ABNORMAL LOW (ref 8.9–10.3)
Chloride: 101 mmol/L (ref 98–111)
Creatinine, Ser: 0.64 mg/dL (ref 0.44–1.00)
Glucose, Bld: 124 mg/dL — ABNORMAL HIGH (ref 70–99)
Potassium: 4.1 mmol/L (ref 3.5–5.1)
Sodium: 139 mmol/L (ref 135–145)
TOTAL PROTEIN: 8.2 g/dL — AB (ref 6.5–8.1)

## 2018-04-28 LAB — RAPID URINE DRUG SCREEN, HOSP PERFORMED
Amphetamines: NOT DETECTED
Barbiturates: NOT DETECTED
Benzodiazepines: NOT DETECTED
COCAINE: NOT DETECTED
OPIATES: NOT DETECTED
TETRAHYDROCANNABINOL: NOT DETECTED

## 2018-04-28 LAB — I-STAT BETA HCG BLOOD, ED (MC, WL, AP ONLY): I-stat hCG, quantitative: <5 m[IU]/mL (ref <5)

## 2018-04-28 LAB — ETHANOL: ALCOHOL ETHYL (B): 443 mg/dL — AB (ref <10)

## 2018-04-28 MED ORDER — LORAZEPAM 2 MG/ML IJ SOLN: 2.0000 mg | Freq: Once | INTRAMUSCULAR | Status: DC

## 2018-04-28 MED ORDER — LORAZEPAM 1 MG PO TABS: 0.0000 mg | ORAL_TABLET | Freq: Two times a day (BID) | ORAL | Status: DC

## 2018-04-28 MED ORDER — THIAMINE HCL 100 MG/ML IJ SOLN
Freq: Once | INTRAVENOUS | Status: AC
  Administered 2018-04-28: 15:00:00 via INTRAVENOUS
  Filled 2018-04-28: qty 1000

## 2018-04-28 MED ORDER — THIAMINE HCL 100 MG/ML IJ SOLN: 100.0000 mg | Freq: Every day | INTRAMUSCULAR | Status: DC

## 2018-04-28 MED ORDER — LORAZEPAM 1 MG PO TABS
0.0000 mg | ORAL_TABLET | Freq: Four times a day (QID) | ORAL | Status: DC
  Administered 2018-04-28: 1 mg via ORAL
  Filled 2018-04-28: qty 1

## 2018-04-28 MED ORDER — LORAZEPAM 2 MG/ML IJ SOLN
0.0000 mg | Freq: Four times a day (QID) | INTRAMUSCULAR | Status: DC
  Administered 2018-04-28 – 2018-04-29 (×2): 2 mg via INTRAVENOUS
  Filled 2018-04-28 (×2): qty 1

## 2018-04-28 MED ORDER — LORAZEPAM 2 MG/ML IJ SOLN
2.0000 mg | Freq: Once | INTRAMUSCULAR | Status: AC
  Administered 2018-04-28: 2 mg via INTRAMUSCULAR
  Filled 2018-04-28: qty 1

## 2018-04-28 MED ORDER — VITAMIN B-1 100 MG PO TABS
100.0000 mg | ORAL_TABLET | Freq: Every day | ORAL | Status: DC
  Administered 2018-04-28 – 2018-04-29 (×2): 100 mg via ORAL
  Filled 2018-04-28 (×2): qty 1

## 2018-04-28 MED ORDER — LORAZEPAM 2 MG/ML IJ SOLN: 0.0000 mg | Freq: Two times a day (BID) | INTRAMUSCULAR | Status: DC

## 2018-04-28 NOTE — ED Notes (Signed)
Bed: [facility]3 Expected date:  Expected time:  Means of arrival:  Comments: 

## 2018-04-28 NOTE — BHH Counselor (Signed)
Spoke with Dr. Francia Greaves. Pt is in need of a peer support consult. TTS consult will be removed.   Lorenza Cambridge, [facility] Triage Specialist

## 2018-04-28 NOTE — ED Notes (Signed)
RN assisted pt back to room in chair. Pt was assisted into bed and then asking to leave. RN informed husband that we cannot hold a pt against her will as it is against the law. Pt's husband was having a discussion with her and holding her back to the bed when RN left the room. MD Messick at bedside when RN left

## 2018-04-28 NOTE — ED Provider Notes (Addendum)
[facility] DEPT Provider Note   CSN: [id number] Arrival date & time: 04/28/18  1316     History   Chief Complaint Chief Complaint  Patient presents with  . Alcohol Intoxication    HPI Brandi Kirk is a 32 y.o. female.  32 year old female with prior history of significant alcohol abuse presents with alcohol intoxication.  Patient was seen yesterday for same complaint.  Patient apparently left the facility yesterday and went home and continued to drink.  Her husband brings her back today secondary to her significant agitation and unsafe behaviors while intoxicated.  Patient apparently has had multiple drinks over the course of the early morning.  She is unsteady on her feet.  She is mildly agitated upon arrival.  Husband reports that she is "unsafe" when intoxicated and he "cannot keep her from drinking."  The history is provided by the patient.  Alcohol Intoxication  This is a recurrent problem. The current episode started 6 to 12 hours ago. The problem occurs daily. The problem has not changed since onset.Pertinent negatives include no chest pain, no abdominal pain, no headaches and no shortness of breath. Nothing aggravates the symptoms. Nothing relieves the symptoms.    Past Medical History:  Diagnosis Date  . Alcohol addiction ([place])   . Back pain 03/18/2016  . Bipolar 1 disorder (Brandi Kirk)   . Blood in stool 07/2017   internal hemorrhoid   . Chicken pox   . Cirrhosis with alcoholism (Brandi Kirk)   . Depression   . Fractured rib 2019  . Frequent headaches   . Frequent UTI   . GERD (gastroesophageal reflux disease)   . HPV in female   . Hypertension   . Migraine   . PCOS (polycystic ovarian syndrome)   . Prediabetes   . Vocal cord dysfunction     Patient Active Problem List   Diagnosis Date Noted  . Class 3 severe obesity due to excess calories with body mass index (BMI) of 40.0 to 44.9 in adult (Brandi Kirk) 04/23/2018  . Insomnia 04/20/2018  .  Prediabetes   . Alcohol use disorder, mild, abuse 03/15/2018  . Multiple closed fractures of ribs of right side 03/15/2018  . Elevated LFTs 03/15/2018  . Depression with anxiety 03/15/2018  . Asthma 11/25/2016  . Cirrhosis with alcoholism [facility])     Past Surgical History:  Procedure Laterality Date  . WISDOM TOOTH EXTRACTION       OB History    Gravida  1   Para  1   Term      Preterm      AB      Living  1     SAB      TAB      Ectopic      Multiple      Live Births               Home Medications    Prior to Admission medications   Medication Sig Start Date End Date Taking? Authorizing Provider  calcium carbonate (TUMS - DOSED IN MG ELEMENTAL CALCIUM) 500 MG chewable tablet Chew 1,000-2,000 tablets by mouth daily as needed for indigestion or heartburn.   Yes [provider]  gabapentin (NEURONTIN) 300 MG capsule Take 2 capsules (600 mg total) by mouth at bedtime. 04/20/18  Yes Kuneff, Renee A, DO  levonorgestrel (MIRENA) 20 MCG/24HR IUD 1 each by Intrauterine route once.   Yes [provider]  omeprazole (PRILOSEC) 20 MG capsule Take 1 capsule (  20 mg total) by mouth daily. 04/20/18  Yes Kuneff, Renee A, DO  ondansetron (ZOFRAN-ODT) 8 MG disintegrating tablet Take by mouth every 8 (eight) hours as needed for nausea or vomiting.   Yes [provider]  QUEtiapine (SEROQUEL) 100 MG tablet 300 mg (3 tabs) Qhs, 100 mg in the afternoon. 04/20/18  Yes Kuneff, Renee A, DO  thiamine 100 MG tablet Take 100 mg by mouth daily.   Yes [provider]  naltrexone (DEPADE) 50 MG tablet Take 1 tablet (50 mg total) by mouth daily. Patient not taking: Reported on 04/27/2018 04/20/18   [name], DO    Family History Family History  Problem Relation Age of Onset  . Diabetes Mother   . Stroke Mother 62  . Hypertension Mother   . Heart murmur Mother   . Alcohol abuse Mother   . Arthritis Mother   . Depression Mother   . Early death  Mother   . Hyperlipidemia Mother   . Kidney disease Mother   . Mental illness Mother   . Epilepsy Mother   . Rheum arthritis Sister   . Heart murmur Sister   . Alcohol abuse Sister   . Bipolar disorder Sister   . Cirrhosis Sister   . Heart murmur Brother   . Alcohol abuse Brother   . Depression Brother   . Mental illness Brother        bipolar  . Alcohol abuse Father   . Depression Father   . Mental illness Father   . Arthritis Maternal Grandmother   . Birth defects Maternal Grandmother   . Hyperlipidemia Maternal Grandmother   . Hyperlipidemia Maternal Grandfather   . Alcohol abuse Maternal Grandfather   . Arthritis Maternal Grandfather   . Diabetes Maternal Grandfather   . Stroke Maternal Grandfather   . Arthritis Paternal Grandmother   . Arthritis Paternal Grandfather   . Birth defects Brother   . Mental illness Brother        bipolar  . Depression Brother   . Mental illness Brother     Social History Social History   Tobacco Use  . Smoking status: Current Some Day Smoker    Packs/day: 0.25    Years: 15.00    Pack years: 3.75    Types: Cigarettes  . Smokeless tobacco: Never Used  . Tobacco comment: refused  Substance Use Topics  . Alcohol use: Yes    Comment: Binge drinker  . Drug use: No     Allergies   Ciprofloxacin and Septra [sulfamethoxazole-trimethoprim]   Review of Systems Review of Systems  Unable to perform ROS: Acuity of condition  Respiratory: Negative for shortness of breath.   Cardiovascular: Negative for chest pain.  Gastrointestinal: Negative for abdominal pain.  Neurological: Negative for headaches.     Physical Exam Updated Vital Signs BP (!) 138/97   Pulse 89   Temp 98.4 F (36.9 C) (Oral)   Resp 16   Ht 5' 4"  (1.626 m)   Wt 113.4 kg (250 lb)   SpO2 97%   BMI 42.91 kg/m   Physical Exam  Constitutional: She is oriented to person, place, and time. She appears well-developed and well-nourished. No distress.  Appears  intoxicated   Agitated   HENT:  Head: Normocephalic and atraumatic.  Mouth/Throat: Oropharynx is clear and moist.  Eyes: Pupils are equal, round, and reactive to light. Conjunctivae and EOM are normal.  Neck: Normal range of motion. Neck supple.  Cardiovascular: Normal rate, regular rhythm  and normal heart sounds.  Pulmonary/Chest: Effort normal and breath sounds normal. No respiratory distress.  Abdominal: Soft. She exhibits no distension. There is no tenderness.  Musculoskeletal: Normal range of motion. She exhibits no edema or deformity.  Neurological: She is alert and oriented to person, place, and time.  Skin: Skin is warm and dry.  Psychiatric: She has a normal mood and affect.  Nursing note and vitals reviewed.    ED Treatments / Results  Labs (all labs ordered are listed, but only abnormal results are displayed) Labs Reviewed  COMPREHENSIVE METABOLIC PANEL - Abnormal; Notable for the following components:      Result Value   Glucose, Bld 124 (*)    Calcium 8.4 (*)    Total Protein 8.2 (*)    AST 68 (*)    ALT 70 (*)    All other components within normal limits  ETHANOL - Abnormal; Notable for the following components:   Alcohol, Ethyl (B) 443 (*)    All other components within normal limits  CBC  RAPID URINE DRUG SCREEN, HOSP PERFORMED  I-STAT BETA HCG BLOOD, ED ([facility], [facility], [facility] ONLY)    EKG None  Radiology No results found.  Procedures Procedures (including critical care time)  Medications Ordered in ED Medications  LORazepam (ATIVAN) injection 2 mg (2 mg Intramuscular Given 04/28/18 1352)  sodium chloride 0.9 % 1,000 mL with thiamine 315 mg, folic acid 1 mg, multivitamins adult 10 mL infusion ( Intravenous New Bag/Given 04/28/18 1500)     Initial Impression / Assessment and Plan / ED Course  I have reviewed the triage vital signs and the nursing notes.  Pertinent labs & imaging results that were available during my care of the patient were reviewed by me  and considered in my medical decision making (see chart for details).     MDM  Screen complete  Patient presenting with acute alcohol intoxication.  Patient apparently is agitated, argumentative, and unsteady on her feet when intoxicated. Family cannot keep patient from drinking.   Family is concerned about patient's safety at home if she continues to drink.   Patient without other significant abnormality on screening labs.   Will place patient on psych hold with [facility] for TTS consult when sober to evaluate safety.    Final Clinical Impressions(s) / ED Diagnoses   Final diagnoses:  Alcoholic intoxication without complication [facility])    ED Discharge Orders    None       Valarie Merino, MD 04/28/18 1525    Valarie Merino, MD 04/28/18 [phone]

## 2018-04-28 NOTE — ED Notes (Signed)
Date and time results received: 04/28/18 14:45  Test: Ethanol  Critical Value: 457m/dL  Name of Provider Notified: Dr. MFrancia Greaveswas notified via telephone.   Orders Received? Or Actions Taken?: Will continue to monitor and await for new orders.

## 2018-04-28 NOTE — ED Triage Notes (Signed)
Pt BIB family. Pt's husband reports binge drinking for 7 days. Pt's husband states last drink this morning and pt had mini wine bottles.  Pt c/o N/V. Pt has not been very active in responding and is having difficulty standing and walking. Husband reports no tremors at home.

## 2018-04-29 ENCOUNTER — Ambulatory Visit: Payer: 59 | Admitting: Clinical

## 2018-04-29 MED ORDER — CHLORDIAZEPOXIDE HCL 25 MG PO CAPS: ORAL_CAPSULE | ORAL | 0 refills | Status: DC

## 2018-04-29 NOTE — ED Provider Notes (Signed)
Pt resting comfortably. No tremor or shakes. Vital signs normal.  Have made referral to peer support - outpt f/u.   Will also provide resource guide for additional community resources.   Vitals:   04/29/18 0700 04/29/18 0730  BP: 123/79 112/69  Pulse: 85 84  Resp:    Temp:    SpO2: 95% 96%   Pt currently appears stable for d/c.      Lajean Saver, MD 04/29/18 [phone]

## 2018-04-29 NOTE — Discharge Instructions (Addendum)
It was our pleasure to provide your ER care today - we hope that you feel better.  Do not drink alcohol - follow up with AA, peer support specialist, and use resource guide provided for additional community resources.   You may take librium as need if alcohol withdrawal symptoms - no driving any time when drinking alcohol, or when taking this medication.   Also follow up with primary care doctor in the coming week.  Return to ER if worse, new symptoms, or other emergency.

## 2018-05-04 ENCOUNTER — Encounter: Payer: Self-pay | Admitting: Family Medicine

## 2018-05-17 ENCOUNTER — Other Ambulatory Visit: Payer: Self-pay

## 2018-05-17 ENCOUNTER — Encounter (HOSPITAL_BASED_OUTPATIENT_CLINIC_OR_DEPARTMENT_OTHER): Payer: Self-pay | Admitting: *Deleted

## 2018-05-17 ENCOUNTER — Emergency Department (HOSPITAL_BASED_OUTPATIENT_CLINIC_OR_DEPARTMENT_OTHER)
Admission: EM | Admit: 2018-05-17 | Discharge: 2018-05-17 | Disposition: A | Discharge status: Home / Self Care | Payer: 59 | Attending: Emergency Medicine | Admitting: Emergency Medicine

## 2018-05-17 DIAGNOSIS — Z5321 Procedure and treatment not carried out due to patient leaving prior to being seen by health care provider: Secondary | ICD-10-CM | POA: Diagnosis not present

## 2018-05-17 DIAGNOSIS — F10929 Alcohol use, unspecified with intoxication, unspecified: Secondary | ICD-10-CM | POA: Insufficient documentation

## 2018-05-17 NOTE — ED Triage Notes (Addendum)
Pt drove self , ETOH , pt states she needs detox.  Reports 4 bottle of wine in last 24 hrs

## 2018-05-17 NOTE — ED Notes (Signed)
Pt seen getting into her car by [employer] and driving away in parking lot

## 2018-05-17 NOTE — ED Notes (Addendum)
Security and [employer] aware of pt

## 2018-05-18 ENCOUNTER — Ambulatory Visit: Payer: Self-pay

## 2018-06-12 ENCOUNTER — Encounter: Payer: Self-pay | Admitting: Family Medicine

## 2018-06-13 DIAGNOSIS — S838X1A Sprain of other specified parts of right knee, initial encounter: Secondary | ICD-10-CM | POA: Diagnosis not present

## 2018-06-16 ENCOUNTER — Encounter (INDEPENDENT_AMBULATORY_CARE_PROVIDER_SITE_OTHER): Payer: Self-pay | Admitting: Physician Assistant

## 2018-06-16 ENCOUNTER — Ambulatory Visit (INDEPENDENT_AMBULATORY_CARE_PROVIDER_SITE_OTHER): Payer: 59 | Admitting: Physician Assistant

## 2018-06-16 ENCOUNTER — Ambulatory Visit (INDEPENDENT_AMBULATORY_CARE_PROVIDER_SITE_OTHER): Payer: Self-pay

## 2018-06-16 VITALS — Ht 64.0 in | Wt 249.1 lb

## 2018-06-16 DIAGNOSIS — M25561 Pain in right knee: Secondary | ICD-10-CM | POA: Diagnosis not present

## 2018-06-16 NOTE — Progress Notes (Signed)
Office Visit Note   Patient: Brandi Kirk           Date of Birth: [birth year]           MRN: [id number] Visit Date: 06/16/2018              Requested by: [name], DO [address] [place], [place] [address] PCP: [name], DO  Chief Complaint  Patient presents with  . Right Knee - Pain      HPI: Patient is a 32 year old female who reports that she slipped and fell approximately 2 Saturdays ago on some water in her garage.  She feels like her right foot went laterally and in her right knee hyperextended.  She presents complaining of pain about the posterior knee as well as along the joint line of the knee.  She reports the pain radiates up the right posterior calf.  [facility] in [facility] and reports x-rays were done and were negative.  The patient's on some ibuprofen 800 mg and reports that this is helping with her pain.  She was also placed in a Velcro knee sleeve and reports that this helps but if she tries to walk out of the knee sleeve she feels like her knee is somewhat unstable.  She does however report that the knee feels very stiff and does not move either.  She is able to move the knee without it locking but reports that it just does not feel like it can move fully.  She has been staying off of it as much as possible utilizing some ice to the area as well.    Assessment & Plan: Visit Diagnoses:  1. Acute pain of right knee     Plan: Suspect internal derangement of the knee with the patient's limited range of motion and painful range of motion..  Plan for MRI scan of the right knee to rule out meniscal tear or ligamentous injury.  She will continue on ibuprofen 800 mg as needed for pain up to 3 times daily.  She can use ice if she desires.  She is going to continue wearing the knee sleeve for stability and stay off of it is much as possible.  We will have her follow-up following the results of the MRI scan.  Follow-Up Instructions: Return in about  1 week (around 06/23/2018) for review of MRI scan with Dr. Sharol Given.   Ortho Exam  Patient is alert, oriented, no adenopathy, well-dressed, normal affect, normal respiratory effort. Patient presents with right Velcro knee sleeve.  She has a small right knee effusion.  She is able to extend actively with 0 at 0 degrees of extension but actually is hyperextensible in the left knee comparatively.  Her flexion is to 75 degrees actively passively she can but go beyond this but it is painful.  She has tenderness to palpation over the medial and lateral joint line and over the tibial tubercle.  She is also tender to palpation over the posterior right knee.  There is no signs of cellulitis no increased warmth no induration.  She may have some mild and laxity medially.  Negative drawer signs.  Imaging: Xr Knee 1-2 Views Right  Result Date: 06/16/2018 Radiographs were done of the right knee and were without evidence of acute fractures.  No images are attached to the encounter.  Labs: Lab Results  Component Value Date   HGBA1C 5.3 04/20/2018   HGBA1C 5.2 11/25/2016  LABURIC 4.1 03/31/2007   REPTSTATUS 03/31/2007 FINAL 03/29/2007     Lab Results  Component Value Date   ALBUMIN 4.4 04/28/2018   ALBUMIN 4.3 04/27/2018   ALBUMIN 4.0 04/20/2018   LABURIC 4.1 03/31/2007    Body mass index is 42.76 kg/m.  Orders:  Orders Placed This Encounter  Procedures  . XR Knee 1-2 Views Right   No orders of the defined types were placed in this encounter.    Procedures: No procedures performed  Clinical Data: No additional findings.  ROS:  All other systems negative, except as noted in the HPI. Review of Systems  Objective: Vital Signs: Ht 5' 4"  (1.626 m)   Wt 249 lb 1.9 oz (113 kg)   BMI 42.76 kg/m   Specialty Comments:  No specialty comments available.  PMFS History: Patient Active Problem List   Diagnosis Date Noted  . Class 3 severe obesity due to excess calories with body mass  index (BMI) of 40.0 to 44.9 in adult ([place]) 04/23/2018  . Insomnia 04/20/2018  . Prediabetes   . Alcohol use disorder, mild, abuse 03/15/2018  . Multiple closed fractures of ribs of right side 03/15/2018  . Elevated LFTs 03/15/2018  . Depression with anxiety 03/15/2018  . Asthma 11/25/2016  . 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 Alcohol addiction ([place])   . Back pain 03/18/2016  . Bipolar 1 disorder ([place])   . Blood in stool 07/2017   internal hemorrhoid   . Chicken pox   . 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 Depression   . Fractured rib 2019  . Frequent headaches   . Frequent UTI   . GERD (gastroesophageal reflux disease)   . HPV in female   . Hypertension   . Migraine   . PCOS (polycystic ovarian syndrome)   . Prediabetes   . Vocal cord dysfunction     Family History  Problem Relation Age of Onset  . Diabetes Mother   . Stroke Mother 52  . Hypertension Mother   . Heart murmur Mother   . Alcohol abuse Mother   . Arthritis Mother   . Depression Mother   . Early death Mother   . Hyperlipidemia Mother   . Kidney disease Mother   . Mental illness Mother   . Epilepsy Mother   . Rheum arthritis Sister   . Heart murmur Sister   . Alcohol abuse Sister   . Bipolar disorder Sister   . Cirrhosis Sister   . Heart murmur Brother   . Alcohol abuse Brother   . Depression Brother   . Mental illness Brother        bipolar  . Alcohol abuse Father   . Depression Father   . Mental illness Father   . Arthritis Maternal Grandmother   . Birth defects Maternal Grandmother   . Hyperlipidemia Maternal Grandmother   . Hyperlipidemia Maternal Grandfather   . Alcohol abuse Maternal Grandfather   . Arthritis Maternal Grandfather   . Diabetes Maternal Grandfather   . Stroke Maternal Grandfather   . Arthritis Paternal Grandmother   . Arthritis Paternal Grandfather   . Birth defects Brother   . Mental illness Brother        bipolar  .  Depression Brother   . Mental illness Brother     Past Surgical History:  Procedure Laterality Date  . WISDOM TOOTH EXTRACTION     Social History   Occupational History  .  Not on file  Tobacco Use  . Smoking status: Current Some Day Smoker    Packs/day: 0.25    Years: 15.00    Pack years: 3.75    Types: Cigarettes  . Smokeless tobacco: Never Used  . Tobacco comment: refused  Substance and Sexual Activity  . Alcohol use: Yes    Comment: Binge drinker  . Drug use: No  . Sexual activity: Yes    Partners: Male    Birth control/protection: IUD

## 2018-06-21 ENCOUNTER — Other Ambulatory Visit (INDEPENDENT_AMBULATORY_CARE_PROVIDER_SITE_OTHER): Payer: Self-pay | Admitting: *Deleted

## 2018-06-21 DIAGNOSIS — G8929 Other chronic pain: Secondary | ICD-10-CM

## 2018-06-21 DIAGNOSIS — M25561 Pain in right knee: Principal | ICD-10-CM

## 2018-06-23 DIAGNOSIS — K219 Gastro-esophageal reflux disease without esophagitis: Secondary | ICD-10-CM | POA: Diagnosis not present

## 2018-06-23 DIAGNOSIS — R112 Nausea with vomiting, unspecified: Secondary | ICD-10-CM | POA: Diagnosis not present

## 2018-06-23 DIAGNOSIS — R1084 Generalized abdominal pain: Secondary | ICD-10-CM | POA: Diagnosis not present

## 2018-06-23 DIAGNOSIS — K429 Umbilical hernia without obstruction or gangrene: Secondary | ICD-10-CM | POA: Diagnosis not present

## 2018-06-23 DIAGNOSIS — F17210 Nicotine dependence, cigarettes, uncomplicated: Secondary | ICD-10-CM | POA: Diagnosis not present

## 2018-06-30 ENCOUNTER — Ambulatory Visit
Admission: RE | Admit: 2018-06-30 | Discharge: 2018-06-30 | Disposition: A | Discharge status: Home / Self Care | Payer: 59 | Source: Ambulatory Visit | Attending: Physician Assistant | Admitting: Physician Assistant

## 2018-06-30 DIAGNOSIS — G8929 Other chronic pain: Secondary | ICD-10-CM

## 2018-06-30 DIAGNOSIS — M25561 Pain in right knee: Principal | ICD-10-CM

## 2018-06-30 DIAGNOSIS — S83241A Other tear of medial meniscus, current injury, right knee, initial encounter: Secondary | ICD-10-CM | POA: Diagnosis not present

## 2018-07-01 ENCOUNTER — Telehealth (INDEPENDENT_AMBULATORY_CARE_PROVIDER_SITE_OTHER): Payer: Self-pay

## 2018-07-01 NOTE — Telephone Encounter (Signed)
-----   Message from Newt Minion, MD sent at 07/01/2018  1:18 PM EDT ----- Set her up for eval with dean for acl repair

## 2018-07-01 NOTE — Telephone Encounter (Signed)
Called pt to advise of MRI result note. Advised that that we will cancel appt with Dr. Sharol Given 07/06/18 and make an appt with Dr. Marlou Sa to discuss surgical correction

## 2018-07-06 ENCOUNTER — Ambulatory Visit (INDEPENDENT_AMBULATORY_CARE_PROVIDER_SITE_OTHER): Payer: 59 | Admitting: Orthopedic Surgery

## 2018-07-09 ENCOUNTER — Ambulatory Visit (INDEPENDENT_AMBULATORY_CARE_PROVIDER_SITE_OTHER): Payer: 59 | Admitting: Orthopedic Surgery

## 2018-07-09 ENCOUNTER — Encounter (INDEPENDENT_AMBULATORY_CARE_PROVIDER_SITE_OTHER): Payer: Self-pay | Admitting: Orthopedic Surgery

## 2018-07-09 DIAGNOSIS — S83511D Sprain of anterior cruciate ligament of right knee, subsequent encounter: Secondary | ICD-10-CM | POA: Diagnosis not present

## 2018-07-09 NOTE — Progress Notes (Signed)
Office Visit Note   Patient: Brandi Kirk           Date of Birth: 1986/04/25           MRN: [id number] Visit Date: 07/09/2018 Requested by: [name], DO [address] [place], [place] [address] PCP: [name], DO  Subjective: Chief Complaint  Patient presents with  . Right Knee - Pain    HPI: Brandi Kirk is a patient with right knee pain.  She slipped in water in early September and has had pain and symptomatic instability in the right knee since that time.  The pain has decreased but she states that she still limping.  The limping is improving.  Hard for her to go up and down stairs.  She does not do any sports.  No family history of DVT or pulmonary embolism.  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              ROS: All systems reviewed are negative as they relate to the chief complaint within the history of present illness.  Patient denies  fevers or chills.   Assessment & Plan: Visit Diagnoses:  1. Rupture of anterior cruciate ligament of right knee, subsequent encounter     Plan: Impression is right knee ACL tear with no meniscal damage.  She has a significant flexion contracture at this time.  I think she needs to work on getting the leg straight before considering any type of surgical intervention.  Based on how much instability she is having 4 weeks out after the injury I think she may eventually come to surgery.  I will give her a heel lift for the nonaffected left hand side in order to facilitate a little bit more full extension.  Discussed physical therapy versus her trying on her own and I showed her my recommended way to get full extension.  We will see her back in 4 weeks for clinical recheck.  Follow-Up Instructions: Return in about 4 weeks (around 08/06/2018).   Orders:  No orders of the defined types were placed in this encounter.  No orders of the defined types were placed in this encounter.     Procedures: No procedures  performed   Clinical Data: No additional findings.  Objective: Vital Signs: There were no vitals taken for this visit.  Physical Exam:   Constitutional: Patient appears well-developed HEENT:  Head: Normocephalic Eyes:EOM are normal Neck: Normal range of motion Cardiovascular: Normal rate Pulmonary/chest: Effort normal Neurologic: Patient is alert Skin: Skin is warm Psychiatric: Patient has normal mood and affect    Ortho Exam: Ortho exam demonstrates about a 20 degree flexion contracture of the right leg.  Pedal pulses palpable.  Patient does have some instability to [facility] and anterior drawer but no posterior lateral rotatory instability.  Collaterals are stable at 0 and 30 degrees.  No other masses lymph adenopathy or skin changes noted in that right knee region.  Flexion is easily past 90 but she does have a lack of full extension.  Specialty Comments:  No specialty comments available.  Imaging: No results found.   PMFS History: Patient Active Problem List   Diagnosis Date Noted  . Class 3 severe obesity due to excess calories with body mass index (BMI) of 40.0 to 44.9 in adult ([place]) 04/23/2018  . Insomnia 04/20/2018  . Prediabetes   . Alcohol use disorder, mild, abuse 03/15/2018  . Multiple closed fractures of  ribs of right side 03/15/2018  . Elevated LFTs 03/15/2018  . Depression with anxiety 03/15/2018  . Asthma 11/25/2016  . 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 Alcohol addiction ([place])   . Back pain 03/18/2016  . Bipolar 1 disorder ([place])   . Blood in stool 07/2017   internal hemorrhoid   . Chicken pox   . 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 Depression   . Fractured rib 2019  . Frequent headaches   . Frequent UTI   . GERD (gastroesophageal reflux disease)   . HPV in female   . Hypertension   . Migraine   . PCOS (polycystic ovarian syndrome)   . Prediabetes   . Vocal cord dysfunction     Family History   Problem Relation Age of Onset  . Diabetes Mother   . Stroke Mother 47  . Hypertension Mother   . Heart murmur Mother   . Alcohol abuse Mother   . Arthritis Mother   . Depression Mother   . Early death Mother   . Hyperlipidemia Mother   . Kidney disease Mother   . Mental illness Mother   . Epilepsy Mother   . Rheum arthritis Sister   . Heart murmur Sister   . Alcohol abuse Sister   . Bipolar disorder Sister   . Cirrhosis Sister   . Heart murmur Brother   . Alcohol abuse Brother   . Depression Brother   . Mental illness Brother        bipolar  . Alcohol abuse Father   . Depression Father   . Mental illness Father   . Arthritis Maternal Grandmother   . Birth defects Maternal Grandmother   . Hyperlipidemia Maternal Grandmother   . Hyperlipidemia Maternal Grandfather   . Alcohol abuse Maternal Grandfather   . Arthritis Maternal Grandfather   . Diabetes Maternal Grandfather   . Stroke Maternal Grandfather   . Arthritis Paternal Grandmother   . Arthritis Paternal Grandfather   . Birth defects Brother   . Mental illness Brother        bipolar  . Depression Brother   . Mental illness Brother     Past Surgical History:  Procedure Laterality Date  . WISDOM TOOTH EXTRACTION     Social History   Occupational History  . Not on file  Tobacco Use  . Smoking status: Current Some Day Smoker    Packs/day: 0.25    Years: 15.00    Pack years: 3.75    Types: Cigarettes  . Smokeless tobacco: Never Used  . Tobacco comment: refused  Substance and Sexual Activity  . Alcohol use: Yes    Comment: Binge drinker  . Drug use: No  . Sexual activity: Yes    Partners: Male    Birth control/protection: IUD

## 2018-07-21 ENCOUNTER — Ambulatory Visit: Payer: Self-pay | Admitting: Family Medicine

## 2018-07-21 DIAGNOSIS — Z0289 Encounter for other administrative examinations: Secondary | ICD-10-CM

## 2018-08-09 ENCOUNTER — Encounter (INDEPENDENT_AMBULATORY_CARE_PROVIDER_SITE_OTHER): Payer: Self-pay | Admitting: Orthopedic Surgery

## 2018-08-09 ENCOUNTER — Ambulatory Visit (INDEPENDENT_AMBULATORY_CARE_PROVIDER_SITE_OTHER): Payer: 59 | Admitting: Orthopedic Surgery

## 2018-08-09 DIAGNOSIS — S83511D Sprain of anterior cruciate ligament of right knee, subsequent encounter: Secondary | ICD-10-CM

## 2018-08-11 ENCOUNTER — Encounter (INDEPENDENT_AMBULATORY_CARE_PROVIDER_SITE_OTHER): Payer: Self-pay | Admitting: Orthopedic Surgery

## 2018-08-11 NOTE — Progress Notes (Signed)
Office Visit Note   Patient: Brandi Kirk           Date of Birth: 1986-03-12           MRN: [id number] Visit Date: 08/09/2018 Requested by: [name], DO [address] [place], [place] [address] PCP: [name], DO  Subjective: Chief Complaint  Patient presents with  . Right Knee - Follow-up    HPI: Brandi Kirk is a patient with right knee ACL injury.  She fell in September.  She is here for clinical recheck.  She stretches daily and has achieved a better range of motion.  She does describe symptomatic instability on the right-hand side.  She did have an excellent contraction earlier but its improved by her history.  Heel lift is out of the left shoe.  She is now able to walk her dog.  She does do a sit down job.              ROS: All systems reviewed are negative as they relate to the chief complaint within the history of present illness.  Patient denies  fevers or chills.   Assessment & Plan: Visit Diagnoses:  1. Rupture of anterior cruciate ligament of right knee, subsequent encounter     Plan: Impression is right knee ACL tear with some symptomatic instability in a patient who really wants to try to avoid surgery.  She does not do much in the way of sports.  She has improved her flexion contracture.  I will have her continue using a brace and continue to work on strengthening exercises.  I think if she continues with symptomatic instability on the right-hand side that it would be in her best interest to consider ACL reconstruction to improve stability.  I will see her back as needed when she decides that she wants to continue consider surgical treatment.  No joint line tenderness today.  Follow-Up Instructions: Return if symptoms worsen or fail to improve.   Orders:  No orders of the defined types were placed in this encounter.  No orders of the defined types were placed in this encounter.     Procedures: No procedures performed   Clinical Data: No additional  findings.  Objective: Vital Signs: There were no vitals taken for this visit.  Physical Exam:   Constitutional: Patient appears well-developed HEENT:  Head: Normocephalic Eyes:EOM are normal Neck: Normal range of motion Cardiovascular: Normal rate Pulmonary/chest: Effort normal Neurologic: Patient is alert Skin: Skin is warm Psychiatric: Patient has normal mood and affect    Ortho Exam: Ortho exam demonstrates normal gait and alignment.  No limp today.  She does have less than a 10 degree flexion contracture which is an improvement.  Flexes fairly easily past 90 with no effusion.  No joint line tenderness but ACL laxity is present with no posterior lateral rotatory instability.  Specialty Comments:  No specialty comments available.  Imaging: No results found.   PMFS History: Patient Active Problem List   Diagnosis Date Noted  . Class 3 severe obesity due to excess calories with body mass index (BMI) of 40.0 to 44.9 in adult ([place]) 04/23/2018  . Insomnia 04/20/2018  . Prediabetes   . Alcohol use disorder, mild, abuse 03/15/2018  . Multiple closed fractures of ribs of right side 03/15/2018  . Elevated LFTs 03/15/2018  . Depression with anxiety 03/15/2018  . Asthma 11/25/2016  . 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  Alcohol addiction ([place])   . Back pain 03/18/2016  . Bipolar 1 disorder ([place])   . Blood in stool 07/2017   internal hemorrhoid   . Chicken pox   . 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 Depression   . Fractured rib 2019  . Frequent headaches   . Frequent UTI   . GERD (gastroesophageal reflux disease)   . HPV in female   . Hypertension   . Migraine   . PCOS (polycystic ovarian syndrome)   . Prediabetes   . Vocal cord dysfunction     Family History  Problem Relation Age of Onset  . Diabetes Mother   . Stroke Mother 37  . Hypertension Mother   . Heart murmur Mother   . Alcohol abuse Mother   . Arthritis  Mother   . Depression Mother   . Early death Mother   . Hyperlipidemia Mother   . Kidney disease Mother   . Mental illness Mother   . Epilepsy Mother   . Rheum arthritis Sister   . Heart murmur Sister   . Alcohol abuse Sister   . Bipolar disorder Sister   . Cirrhosis Sister   . Heart murmur Brother   . Alcohol abuse Brother   . Depression Brother   . Mental illness Brother        bipolar  . Alcohol abuse Father   . Depression Father   . Mental illness Father   . Arthritis Maternal Grandmother   . Birth defects Maternal Grandmother   . Hyperlipidemia Maternal Grandmother   . Hyperlipidemia Maternal Grandfather   . Alcohol abuse Maternal Grandfather   . Arthritis Maternal Grandfather   . Diabetes Maternal Grandfather   . Stroke Maternal Grandfather   . Arthritis Paternal Grandmother   . Arthritis Paternal Grandfather   . Birth defects Brother   . Mental illness Brother        bipolar  . Depression Brother   . Mental illness Brother     Past Surgical History:  Procedure Laterality Date  . WISDOM TOOTH EXTRACTION     Social History   Occupational History  . Not on file  Tobacco Use  . Smoking status: Current Some Day Smoker    Packs/day: 0.25    Years: 15.00    Pack years: 3.75    Types: Cigarettes  . Smokeless tobacco: Never Used  . Tobacco comment: refused  Substance and Sexual Activity  . Alcohol use: Yes    Comment: Binge drinker  . Drug use: No  . Sexual activity: Yes    Partners: Male    Birth control/protection: IUD

## 2018-09-13 DIAGNOSIS — Z882 Allergy status to sulfonamides status: Secondary | ICD-10-CM | POA: Diagnosis not present

## 2018-09-13 DIAGNOSIS — F17210 Nicotine dependence, cigarettes, uncomplicated: Secondary | ICD-10-CM | POA: Diagnosis not present

## 2018-09-13 DIAGNOSIS — R112 Nausea with vomiting, unspecified: Secondary | ICD-10-CM | POA: Diagnosis not present

## 2018-09-20 ENCOUNTER — Encounter: Payer: Self-pay | Admitting: Family Medicine

## 2018-09-20 ENCOUNTER — Other Ambulatory Visit: Payer: Self-pay

## 2018-09-20 DIAGNOSIS — F1010 Alcohol abuse, uncomplicated: Secondary | ICD-10-CM

## 2018-09-20 DIAGNOSIS — K219 Gastro-esophageal reflux disease without esophagitis: Secondary | ICD-10-CM

## 2018-09-20 MED ORDER — OMEPRAZOLE 20 MG PO CPDR: 20.0000 mg | DELAYED_RELEASE_CAPSULE | Freq: Every day | ORAL | 5 refills | Status: DC

## 2018-09-20 NOTE — Telephone Encounter (Signed)
Pt calls requesting for her reflux/heartburn medication to be refilled. Refilled medication w #90 w 1 rf.

## 2018-12-03 ENCOUNTER — Encounter: Payer: Self-pay | Admitting: Family Medicine

## 2019-01-11 ENCOUNTER — Encounter: Payer: Self-pay | Admitting: Family Medicine

## 2019-01-12 ENCOUNTER — Ambulatory Visit (INDEPENDENT_AMBULATORY_CARE_PROVIDER_SITE_OTHER): Payer: 59 | Admitting: Family Medicine

## 2019-01-12 ENCOUNTER — Telehealth: Payer: Self-pay | Admitting: Family Medicine

## 2019-01-12 ENCOUNTER — Other Ambulatory Visit: Payer: Self-pay

## 2019-01-12 ENCOUNTER — Encounter: Payer: Self-pay | Admitting: Family Medicine

## 2019-01-12 ENCOUNTER — Ambulatory Visit: Payer: 59

## 2019-01-12 VITALS — BP 135/94 | HR 96 | Ht 64.0 in | Wt 273.0 lb

## 2019-01-12 DIAGNOSIS — R000 Tachycardia, unspecified: Secondary | ICD-10-CM | POA: Insufficient documentation

## 2019-01-12 DIAGNOSIS — Z6841 Body Mass Index (BMI) 40.0 and over, adult: Secondary | ICD-10-CM | POA: Diagnosis not present

## 2019-01-12 DIAGNOSIS — I10 Essential (primary) hypertension: Secondary | ICD-10-CM | POA: Insufficient documentation

## 2019-01-12 HISTORY — DX: Tachycardia, unspecified: R00.0

## 2019-01-12 MED ORDER — METOPROLOL TARTRATE 25 MG PO TABS: 25.0000 mg | ORAL_TABLET | Freq: Two times a day (BID) | ORAL | 0 refills | Status: DC

## 2019-01-12 NOTE — Telephone Encounter (Signed)
Patient advised of all information and scheduled VV 01/26/2019.

## 2019-01-12 NOTE — Telephone Encounter (Signed)
Patient was in the office today for blood pressure check.  Please advise patient that I have called in a medication called metoprolol, which she is taken every 12 hours.  This helps with heart rate and will also help with her blood pressure.   -Stay hydrated, drink plenty of water.  Avoid high sodium/salty foods.  Attempt to get daily routine exercise. -Please ask her to monitor her blood pressure 3 times a week, and make certain when she checks her blood pressure at least 2 hours after her medication.  Record those blood pressures so that when we see her for follow-up in a few weeks we can review. -Once we get a dose that is appropriate, we can prescribe the long-acting formulation of this medication-she would only need to take once a day. - Please schedule her to follow-up in 2 weeks for a virtual visit.

## 2019-01-12 NOTE — Progress Notes (Signed)
Virtual Visit via Video   I connected with Brandi Kirk on 01/12/19 at  9:40 AM EDT by a video enabled telemedicine application and verified that I am speaking with the correct person using two identifiers. Location patient: Home Location provider: [facility], Office Persons participating in the virtual visit: Patient, Dr. Raoul Pitch and R.Baker, LPN  I discussed the limitations of evaluation and management by telemedicine and the availability of in person appointments. The patient expressed understanding and agreed to proceed.  Subjective:   Chief Complaint  Patient presents with  . Hypertension    Pt has been having high BP readings 150-170's/100-110. Feeling out of breath easily and heart racing     HPI:  Hypertension:  Pt reports he has had elevated blood pressures at home between 062-376 systolic and 283-151 diastolics.  Patient reports last week she started to feel shaky or dizzy so she started taking her blood pressure.  She also feels she is getting mildly winded with exertion or climbing stairs.  She endorses mild daily headache.  She denies lower extremity edema, but feels like her rings are mildly tight.  He has a history of daily alcohol use.  She reports she has been cutting back her alcohol and she has a to drink limit, she does endorse drinking 3-4 times a week.  She  denies chest pain  or lower extremity edema.   She has been on labetalol a few years ago for hypertension. BMP: 04/28/2018 elevated liver enzymes, low calcium normal GFR CBC: 04/28/2018 within normal limits Lipids: 04/20/2018 within normal limits TSH: 04/20/2018 WNL Diet: Does not follow a routine diet Exercise: Not exercise routinely RF: Hypertension, obesity, family history of stroke    ROS: See pertinent positives and negatives per HPI.  Patient Active Problem List   Diagnosis Date Noted  . Class 3 severe obesity due to excess calories with body mass index (BMI) of 40.0 to 44.9 in adult ([place])  04/23/2018  . Insomnia 04/20/2018  . Prediabetes   . Alcohol use disorder, mild, abuse 03/15/2018  . Multiple closed fractures of ribs of right side 03/15/2018  . Elevated LFTs 03/15/2018  . Depression with anxiety 03/15/2018  . Asthma 11/25/2016  . 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 Smoking status: Current Some Day Smoker    Packs/day: 0.25    Years: 15.00    Pack years: 3.75    Types: Cigarettes  . Smokeless tobacco: Never Used  . Tobacco comment: refused  Substance Use Topics  . Alcohol use: Yes    Comment: Binge drinker    Current Outpatient Medications:  .  calcium carbonate (TUMS - DOSED IN MG ELEMENTAL CALCIUM) 500 MG chewable tablet, Chew 1,000-2,000 tablets by mouth daily as needed for indigestion or heartburn., Disp: , Rfl:  .  gabapentin (NEURONTIN) 300 MG capsule, Take 2 capsules (600 mg total) by mouth at bedtime., Disp: 60 capsule, Rfl: 5 .  levonorgestrel (MIRENA) 20 MCG/24HR IUD, 1 each by Intrauterine route once., Disp: , Rfl:  .  naltrexone (DEPADE) 50 MG tablet, Take 1 tablet (50 mg total) by mouth daily., Disp: 30 tablet, Rfl: 2 .  omeprazole (PRILOSEC) 20 MG capsule, Take 1 capsule (20 mg total) by mouth daily., Disp: 30 capsule, Rfl: 5 .  ondansetron (ZOFRAN-ODT) 8 MG disintegrating tablet, Take by mouth every 8 (eight) hours as needed for nausea or vomiting., Disp: , Rfl:  .  QUEtiapine (SEROQUEL) 100 MG  tablet, 300 mg (3 tabs) Qhs, 100 mg in the afternoon., Disp: 120 tablet, Rfl: 2 .  thiamine 100 MG tablet, Take 100 mg by mouth daily., Disp: , Rfl:   Allergies  Allergen Reactions  . Ciprofloxacin Other (See Comments)    intolerance  . Septra [Sulfamethoxazole-Trimethoprim] Other (See Comments)    Thrush   . Tylenol [Acetaminophen] Other (See Comments)    Patient stated she can't take it due to liver    Objective:  BP (!) 135/94 (BP Location: Left Arm, Cuff Size: Large)   Pulse 96   Ht 5' 4"  (1.626 m)   Wt 273  lb (123.8 kg)   SpO2 98%   BMI 46.86 kg/m  Gen: No acute distress. Nontoxic in appearance.  HENT: AT. [place].  MMM.  Eyes:Pupils Equal Round Reactive to light, Extraocular movements intact,  Conjunctiva without redness, discharge or icterus. CV: Regular rate and rhythm, no murmur, no edema.  No JVD. Chest: CTA B.  No wheezing or rhonchi or rales.  No cough or shortness of breath.   Neuro: Normal gait. Alert. Oriented x3  Psych: Normal affect, dress and demeanor. Normal speech. Normal thought content and judgment.   Assessment and Plan:  Brandi Kirk is a 33 y.o. present for elevated BP readings at home.  Essential hypertension/Class 3 severe obesity(BMI) of 40.0 to 44.9 in adult/Tachycardia -Blood pressures are borderline with over goal diastolics on recheck today in the office x2.  Start metoprolol 25 mg twice daily.  Monitor blood pressures at home and record.   -Routine daily exercise -Low-sodium -Continue to refrain from drinking alcohol. -If symptoms of dyspnea continue with better control of blood pressure and heart rate, consider cardiology referral versus echocardiogram, with her alcohol use and family history - Follow-up in 2 weeks with provider appointment.  > 25 minutes spent with patient, >50% of time spent face to face    Howard Pouch, DO 01/12/2019

## 2019-01-12 NOTE — Patient Instructions (Signed)
Start metoprolol 25 mg twice daily.  Monitor blood pressure and record at least 3 times a week.  Follow-up in 2 weeks with provider with blood pressure readings.

## 2019-01-26 ENCOUNTER — Encounter: Payer: Self-pay | Admitting: Family Medicine

## 2019-01-26 ENCOUNTER — Ambulatory Visit (INDEPENDENT_AMBULATORY_CARE_PROVIDER_SITE_OTHER): Payer: 59 | Admitting: Family Medicine

## 2019-01-26 ENCOUNTER — Other Ambulatory Visit: Payer: Self-pay

## 2019-01-26 VITALS — BP 130/93 | HR 96 | Temp 98.9°F | Ht 64.0 in

## 2019-01-26 DIAGNOSIS — I10 Essential (primary) hypertension: Secondary | ICD-10-CM | POA: Diagnosis not present

## 2019-01-26 DIAGNOSIS — Z6841 Body Mass Index (BMI) 40.0 and over, adult: Secondary | ICD-10-CM | POA: Diagnosis not present

## 2019-01-26 DIAGNOSIS — R000 Tachycardia, unspecified: Secondary | ICD-10-CM

## 2019-01-26 MED ORDER — METOPROLOL SUCCINATE ER 50 MG PO TB24: 50.0000 mg | ORAL_TABLET | Freq: Every day | ORAL | 1 refills | Status: DC

## 2019-01-26 NOTE — Progress Notes (Signed)
VIRTUAL VISIT VIA VIDEO  I connected with Brandi Kirk on 01/26/19 at 11:00 AM EDT by a video enabled telemedicine application and verified that I am speaking with the correct person using two identifiers. Location patient: Home Location provider: [facility], Office Persons participating in the virtual visit: Patient, Dr. Raoul Pitch and R.Baker, LPN  I discussed the limitations of evaluation and management by telemedicine and the availability of in person appointments. The patient expressed understanding and agreed to proceed.   SUBJECTIVE Chief Complaint  Patient presents with  . Follow-up    2 week F/U for BP. 138/95 This AM. Pt took medications this morning around 9am and BP check at 10:45. Rechecked again while on the phone. Reports BP running 130's over 90's      HPI:  Hypertension/tachycardia/obesity:  Pt had elevated blood pressures at home between 488-891 systolic and 694-503 diastolics. She was started on Lopressor 25 mg twice daily, has been monitoring her blood pressure at home for the last 2 weeks.  She reports the majority of her blood pressures are 130s/90s, she has not had any lower than 130/90 she has had 2 higher blood pressure readings 147-150/96-100.  Patient denies chest pain, shortness of breath, dizziness or lower extremity edema.  She reports she has been cutting back her alcohol and she has a to drink limit, she does endorse drinking 3-4 times a week.  She has been on labetalol a few years ago for hypertension. BMP: 04/28/2018 elevated liver enzymes, low calcium normal GFR CBC: 04/28/2018 within normal limits Lipids: 04/20/2018 within normal limits TSH: 04/20/2018 WNL Diet: Does not follow a routine diet Exercise: Not exercise routinely RF: Hypertension, obesity, family history of stroke ROS: See pertinent positives and negatives per HPI.  Patient Active Problem List   Diagnosis Date Noted  . Tachycardia 01/12/2019  . Essential hypertension 01/12/2019  .  Class 3 severe obesity due to excess calories with body mass index (BMI) of 40.0 to 44.9 in adult ([place]) 04/23/2018  . Insomnia 04/20/2018  . Prediabetes   . Alcohol use disorder, mild, abuse 03/15/2018  . Multiple closed fractures of ribs of right side 03/15/2018  . Elevated LFTs 03/15/2018  . Depression with anxiety 03/15/2018  . Polysubstance abuse ([place]) 11/18/2017  . Hypomagnesemia 11/17/2017  . Asthma 11/25/2016  . 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 Smoking status: Current Some Day Smoker    Packs/day: 0.25    Years: 15.00    Pack years: 3.75    Types: Cigarettes  . Smokeless tobacco: Never Used  . Tobacco comment: refused  Substance Use Topics  . Alcohol use: Yes    Comment: Binge drinker    Current Outpatient Medications:  .  calcium carbonate (TUMS - DOSED IN MG ELEMENTAL CALCIUM) 500 MG chewable tablet, Chew 1,000-2,000 tablets by mouth daily as needed for indigestion or heartburn., Disp: , Rfl:  .  gabapentin (NEURONTIN) 300 MG capsule, Take 2 capsules (600 mg total) by mouth at bedtime., Disp: 60 capsule, Rfl: 5 .  levonorgestrel (MIRENA) 20 MCG/24HR IUD, 1 each by Intrauterine route once., Disp: , Rfl:  .  metoprolol tartrate (LOPRESSOR) 25 MG tablet, Take 1 tablet (25 mg total) by mouth 2 (two) times daily., Disp: 60 tablet, Rfl: 0 .  naltrexone (DEPADE) 50 MG tablet, Take 1 tablet (50 mg total) by mouth daily., Disp: 30 tablet, Rfl: 2 .  omeprazole (PRILOSEC) 20 MG capsule, Take 1 capsule (20  mg total) by mouth daily., Disp: 30 capsule, Rfl: 5 .  ondansetron (ZOFRAN-ODT) 8 MG disintegrating tablet, Take by mouth every 8 (eight) hours as needed for nausea or vomiting., Disp: , Rfl:  .  QUEtiapine (SEROQUEL) 100 MG tablet, 300 mg (3 tabs) Qhs, 100 mg in the afternoon., Disp: 120 tablet, Rfl: 2 .  thiamine 100 MG tablet, Take 100 mg by mouth daily., Disp: , Rfl:   Allergies  Allergen Reactions  . Ciprofloxacin Other (See Comments)     intolerance  . Septra [Sulfamethoxazole-Trimethoprim] Other (See Comments)    Thrush   . Tylenol [Acetaminophen] Other (See Comments)    Patient stated she can't take it due to liver    OBJECTIVE: BP (!) 130/93   Pulse 96   Temp 98.9 F (37.2 C) (Tympanic)   Ht 5' 4"  (1.626 m)   BMI 46.86 kg/m  Gen: No acute distress. Nontoxic in appearance.  HENT: AT. [place].  MMM.  Eyes:Pupils Equal Round Reactive to light, Extraocular movements intact,  Conjunctiva without redness, discharge or icterus. CV: no edema Chest: Cough or shortness of breath not present Neuro:  Normal gait. Alert. Oriented x3  Psych: Normal affect, dress and demeanor. Normal speech. Normal thought content and judgment.  ASSESSMENT AND PLAN: [name] is a 33 y.o. female present for  Essential hypertension/Class 3 severe obesity(BMI) of 40.0 to 44.9 in adult/Tachycardia -Improving with start of metoprolol 25 mg twice daily.  She is tolerating medicine without side effect.  Increase to 50 mg Lopressor twice daily of her current bottle.  She should have about 7 days left at this dose.  New prescription provided today for metoprolol XL 50 mg daily.  She understands the instructions and is aware to take new bottle once a day only. -Routine daily exercise -Low-sodium -Continue to refrain from drinking alcohol. - consider cardiology referral versus echocardiogram, with her alcohol use and family history if symptoms recur. -Labs next appointment. -We will call in next week to report blood pressures if above goal will need virtual visit. - Follow-up in 6 mos as long as blood pressures remain below 135/85 routinely.  If still above this goal or symptoms recur, she will need to be seen sooner.   Howard Pouch, DO 01/26/2019

## 2019-01-26 NOTE — Patient Instructions (Signed)
Continue to monitor blood pressures with medication change. If routinely over 135/85 despite changes in dose, please make a virtual visit to follow-up immediately. If your symptoms recur as far as shortness of breath or you develop swelling of the ankles/edema or chest pain please be seen immediately. Follow-up in 6 months as long as blood pressures are at goal and you do not have any symptoms.  Labs will be collected on that visit, please come fasting if possible.  Meds: Increase metoprolol to 25 mg twice daily of the current prescription bottle that you have.  The new prescription will be the longer acting formation of this medication.  When you pick up your new bottle please take the metoprolol XL 50 mg 1 tab daily.  Stay safe and well/

## 2019-01-30 ENCOUNTER — Other Ambulatory Visit: Payer: Self-pay | Admitting: Family Medicine

## 2019-01-30 DIAGNOSIS — F418 Other specified anxiety disorders: Secondary | ICD-10-CM

## 2019-01-31 NOTE — Telephone Encounter (Signed)
Please set her up for a virtual visit concerning her seroquel refill. We have seen her recently for her new BP issues. We have not touched base on her other conditions and for safety would need to discuss those meds before refilling.

## 2019-01-31 NOTE — Telephone Encounter (Signed)
My chart message sent to patient to schedule appt for further refills

## 2019-01-31 NOTE — Telephone Encounter (Signed)
RF request for Seroquel  LOV:01/26/2019 for HTN, 04/20/2018 for wellness exam  Next ov: Not scheduled  Last written: 04/20/2018 #120 x2 refills   Please advise

## 2019-02-01 ENCOUNTER — Encounter: Payer: Self-pay | Admitting: Family Medicine

## 2019-02-01 ENCOUNTER — Other Ambulatory Visit: Payer: Self-pay

## 2019-02-01 ENCOUNTER — Ambulatory Visit (INDEPENDENT_AMBULATORY_CARE_PROVIDER_SITE_OTHER): Payer: 59 | Admitting: Family Medicine

## 2019-02-01 VITALS — BP 133/92 | HR 80 | Ht 64.0 in

## 2019-02-01 DIAGNOSIS — G255 Other chorea: Secondary | ICD-10-CM

## 2019-02-01 DIAGNOSIS — F418 Other specified anxiety disorders: Secondary | ICD-10-CM

## 2019-02-01 DIAGNOSIS — F1010 Alcohol abuse, uncomplicated: Secondary | ICD-10-CM | POA: Diagnosis not present

## 2019-02-01 DIAGNOSIS — K7030 Alcoholic cirrhosis of liver without ascites: Secondary | ICD-10-CM | POA: Diagnosis not present

## 2019-02-01 DIAGNOSIS — G4700 Insomnia, unspecified: Secondary | ICD-10-CM

## 2019-02-01 MED ORDER — OMEPRAZOLE 20 MG PO CPDR: 20.0000 mg | DELAYED_RELEASE_CAPSULE | Freq: Every day | ORAL | 5 refills | Status: DC

## 2019-02-01 MED ORDER — GABAPENTIN 300 MG PO CAPS: 900.0000 mg | ORAL_CAPSULE | Freq: Every day | ORAL | 1 refills | Status: DC

## 2019-02-01 MED ORDER — QUETIAPINE FUMARATE 100 MG PO TABS: ORAL_TABLET | ORAL | 1 refills | Status: DC

## 2019-02-01 MED ORDER — NALTREXONE HCL 50 MG PO TABS: 50.0000 mg | ORAL_TABLET | Freq: Every day | ORAL | 3 refills | Status: DC

## 2019-02-01 NOTE — Progress Notes (Signed)
VIRTUAL VISIT VIA VIDEO  I connected with Brandi Kirk on 02/01/19 at  2:00 PM EDT by a video enabled telemedicine application and verified that I am speaking with the correct person using two identifiers. Location patient: Home Location provider: [facility], Office Persons participating in the virtual visit: Patient, Dr. Raoul Pitch and R.Baker, LPN  I discussed the limitations of evaluation and management by telemedicine and the availability of in person appointments. The patient expressed understanding and agreed to proceed.   SUBJECTIVE Chief Complaint  Patient presents with  . Insomnia    Pt needs refills on medications.     HPI:  Brandi Kirk is a 33 y.o. female present for follow up on  Alcoholic cirrhosis, unspecified whether ascites present (HCC)/Depression with anxiety/ Insomnia, unspecified type/Alcohol use disorder, mild, abuse Patient reports she could use some improvement on her depression and anxiety coverage.  She is tolerating the Seroquel and does not feel it makes her sleepy.  Does feel like it is making her want to eat more.  She is taking the gabapentin 600 mg nightly.  She feels she is having jerky all over body muscle movements over the past few months and is uncertain the cause or if it is related to her medications.  He has not been taking the naltrexone for a few months secondary to running out of medication.  She is still drinking 1-2 drinks a few times a week with her husband, but is never drinking more than 2 drinks now.  She reports some nausea feeling when using the naltrexone.  However if it will help her move away from drinking, she would like to stay on the medication.  She is taking the B complex supplement.  She feels her restless leg at night is not well controlled.  ROS: See pertinent positives and negatives per HPI.  Patient Active Problem List   Diagnosis Date Noted  . Tachycardia 01/12/2019  . Essential hypertension 01/12/2019  . Class 3  severe obesity due to excess calories with body mass index (BMI) of 40.0 to 44.9 in adult ([place]) 04/23/2018  . Insomnia 04/20/2018  . Prediabetes   . Alcohol use disorder, mild, abuse 03/15/2018  . Multiple closed fractures of ribs of right side 03/15/2018  . Elevated LFTs 03/15/2018  . Depression with anxiety 03/15/2018  . Polysubstance abuse ([place]) 11/18/2017  . Hypomagnesemia 11/17/2017  . Asthma 11/25/2016  . 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 Smoking status: Current Some Day Smoker    Packs/day: 0.25    Years: 15.00    Pack years: 3.75    Types: Cigarettes  . Smokeless tobacco: Never Used  . Tobacco comment: refused  Substance Use Topics  . Alcohol use: Yes    Comment: Binge drinker    Current Outpatient Medications:  .  calcium carbonate (TUMS - DOSED IN MG ELEMENTAL CALCIUM) 500 MG chewable tablet, Chew 1,000-2,000 tablets by mouth daily as needed for indigestion or heartburn., Disp: , Rfl:  .  gabapentin (NEURONTIN) 300 MG capsule, Take 2 capsules (600 mg total) by mouth at bedtime., Disp: 60 capsule, Rfl: 5 .  levonorgestrel (MIRENA) 20 MCG/24HR IUD, 1 each by Intrauterine route once., Disp: , Rfl:  .  metoprolol succinate (TOPROL-XL) 50 MG 24 hr tablet, Take 1 tablet (50 mg total) by mouth daily. Take with or immediately following a meal. (Patient taking differently: Take 50 mg by mouth daily. Take with or immediately following  a meal.  Taking BID), Disp: 90 tablet, Rfl: 1 .  naltrexone (DEPADE) 50 MG tablet, Take 1 tablet (50 mg total) by mouth daily., Disp: 30 tablet, Rfl: 2 .  omeprazole (PRILOSEC) 20 MG capsule, Take 1 capsule (20 mg total) by mouth daily., Disp: 30 capsule, Rfl: 5 .  ondansetron (ZOFRAN-ODT) 8 MG disintegrating tablet, Take by mouth every 8 (eight) hours as needed for nausea or vomiting., Disp: , Rfl:  .  QUEtiapine (SEROQUEL) 100 MG tablet, 300 mg (3 tabs) Qhs, 100 mg in the afternoon., Disp: 120 tablet, Rfl: 2 .   thiamine 100 MG tablet, Take 100 mg by mouth daily., Disp: , Rfl:   Allergies  Allergen Reactions  . Ciprofloxacin Other (See Comments)    intolerance  . Septra [Sulfamethoxazole-Trimethoprim] Other (See Comments)    Thrush   . Tylenol [Acetaminophen] Other (See Comments)    Patient stated she can't take it due to liver    OBJECTIVE: BP (!) 133/92   Pulse 80   Ht 5' 4"  (1.626 m)   BMI 46.86 kg/m  Gen: No acute distress. Nontoxic in appearance.  HENT: AT. [place].  MMM.  Eyes:Pupils Equal Round Reactive to light, Extraocular movements intact,  Conjunctiva without redness, discharge or icterus. CV: no edema Neuro: Normal gait. Alert. Oriented x3. No tremor visualized Psych: Normal affect, dress and demeanor. Normal speech. Normal thought content and judgment. Depression screen [facility] 2/9 02/01/2019 04/20/2018 04/09/2018 03/15/2018  Decreased Interest 3 1 2  0  Down, Depressed, Hopeless 1 1 3 3   PHQ - 2 Score 4 2 5 3   Altered sleeping 3 3 3 3   Tired, decreased energy 3 3 3 3   Change in appetite 3 3 3 3   Feeling bad or failure about yourself  1 - 3 3  Trouble concentrating 0 1 2 1   Moving slowly or fidgety/restless 3 1 2  0  Suicidal thoughts 0 0 0 0  PHQ-9 Score 17 13 21 16   Difficult doing work/chores Somewhat difficult Somewhat difficult - -   GAD 7 : Generalized Anxiety Score 02/01/2019 04/09/2018  Nervous, Anxious, on Edge 3 3  Control/stop worrying 0 3  Worry too much - different things 3 3  Trouble relaxing 3 3  Restless 0 1  Easily annoyed or irritable 3 3  Afraid - awful might happen 1 1  Total GAD 7 Score 13 17  Anxiety Difficulty Not difficult at all -    ASSESSMENT AND PLAN: Brandi Kirk is a 33 y.o. female present for  Alcoholic cirrhosis, unspecified whether ascites present (HCC)/Depression with anxiety/ Insomnia, unspecified type/Alcohol use disorder, mild, abuse/jerking muscle movements -The discussion with the patient surrounding her jerking muscle movements.  This is  possibly a side effect of gabapentin.   -Gabapentin: Therefore we will decrease her dose to 300 mg nightly.  If her symptoms are relieved which is lower dose, she can continue to 300 mg nightly if she desires.  If muscle movements remain, or she desires to discontinue medication, she is to taper off after 2 weeks at 300 mg nightly to 300 mg every other day for 7 days and then stop gabapentin. -Seroquel: Keep Seroquel daily dose the same however changed to 300 mg nightly instead of divided doses. - start Cymbalta 60 mg in the morning for better anxiety and depression coverage. -She does desire to stay on the naltrexone at least for another few months.  Refills provided. -Patient did write down all instructions, read back was correct. -Patient  was advised to follow-up in 6 weeks on the medication changes above.   > 25 minutes spent with patient, >50% of time spent face to face counseling and coordinating care.     Howard Pouch, DO 02/01/2019  \

## 2019-02-02 ENCOUNTER — Telehealth: Payer: Self-pay | Admitting: Family Medicine

## 2019-02-02 ENCOUNTER — Encounter: Payer: Self-pay | Admitting: Family Medicine

## 2019-02-02 DIAGNOSIS — F1010 Alcohol abuse, uncomplicated: Secondary | ICD-10-CM

## 2019-02-02 DIAGNOSIS — F418 Other specified anxiety disorders: Secondary | ICD-10-CM

## 2019-02-02 MED ORDER — DULOXETINE HCL 60 MG PO CPEP: 60.0000 mg | ORAL_CAPSULE | Freq: Every day | ORAL | 0 refills | Status: DC

## 2019-02-02 MED ORDER — QUETIAPINE FUMARATE 100 MG PO TABS: 300.0000 mg | ORAL_TABLET | Freq: Every day | ORAL | 0 refills | Status: DC

## 2019-02-02 MED ORDER — GABAPENTIN 300 MG PO CAPS: 300.0000 mg | ORAL_CAPSULE | Freq: Every day | ORAL | 1 refills | Status: DC

## 2019-02-02 NOTE — Telephone Encounter (Signed)
Pharmacy was called and all prior Scripts were D/C.

## 2019-02-02 NOTE — Telephone Encounter (Signed)
Touched base with patient today to discuss medication changes.  Medication changes reflected in office visit note.  Prescriptions for gabapentin and Seroquel with new dosages called into pharmacy today-pharmacy will need to discontinue all other gabapentin and Seroquel prescriptions on file including what was sent this week. Pharmacy will also be called to ensure if they dispense correct labels.  Patient did write down all instructions and reports understanding on her Seroquel, gabapentin and Cymbalta medications.  Brandi Kirk please call pharmacy and make sure they dispense the proper dosages. They likley already had the other ones ready for her to pick up and I do not want her to receive wrong labels if at all possible.

## 2019-02-02 NOTE — Telephone Encounter (Signed)
Pharmacy verified all scripts and the dosages with the new changes

## 2019-02-16 ENCOUNTER — Encounter: Payer: Self-pay | Admitting: Family Medicine

## 2019-03-22 ENCOUNTER — Emergency Department (HOSPITAL_COMMUNITY)
Admission: EM | Admit: 2019-03-22 | Discharge: 2019-03-23 | Disposition: A | Discharge status: Home / Self Care | Payer: 59 | Attending: Emergency Medicine | Admitting: Emergency Medicine

## 2019-03-22 ENCOUNTER — Encounter (HOSPITAL_COMMUNITY): Payer: Self-pay | Admitting: Emergency Medicine

## 2019-03-22 DIAGNOSIS — Z79899 Other long term (current) drug therapy: Secondary | ICD-10-CM | POA: Diagnosis not present

## 2019-03-22 DIAGNOSIS — F17210 Nicotine dependence, cigarettes, uncomplicated: Secondary | ICD-10-CM | POA: Diagnosis not present

## 2019-03-22 DIAGNOSIS — I10 Essential (primary) hypertension: Secondary | ICD-10-CM | POA: Insufficient documentation

## 2019-03-22 DIAGNOSIS — T510X2A Toxic effect of ethanol, intentional self-harm, initial encounter: Secondary | ICD-10-CM | POA: Diagnosis present

## 2019-03-22 DIAGNOSIS — F19929 Other psychoactive substance use, unspecified with intoxication, unspecified: Secondary | ICD-10-CM | POA: Diagnosis not present

## 2019-03-22 LAB — I-STAT BETA HCG BLOOD, ED (MC, WL, AP ONLY): I-stat hCG, quantitative: <5 m[IU]/mL (ref <5)

## 2019-03-22 LAB — BLOOD GAS, ARTERIAL
Acid-Base Excess: 0.6 mmol/L (ref 0.0–2.0)
Bicarbonate: 25.8 mmol/L (ref 20.0–28.0)
Drawn by: 308601
FIO2: 21
O2 Saturation: 93.5 %
Patient temperature: 98.6
pCO2 arterial: 46.3 mmHg (ref 32.0–48.0)
pH, Arterial: 7.366 (ref 7.350–7.450)
pO2, Arterial: 74 mmHg — ABNORMAL LOW (ref 83.0–108.0)

## 2019-03-22 LAB — CBC WITH DIFFERENTIAL/PLATELET

## 2019-03-22 LAB — LACTIC ACID, PLASMA: Lactic Acid, Venous: 3.5 mmol/L (ref 0.5–1.9)

## 2019-03-22 MED ORDER — ONDANSETRON HCL 4 MG/2ML IJ SOLN
4.0000 mg | Freq: Once | INTRAMUSCULAR | Status: AC
  Administered 2019-03-22: 4 mg via INTRAVENOUS
  Filled 2019-03-22: qty 2

## 2019-03-22 MED ORDER — SODIUM CHLORIDE 0.9 % IV BOLUS
2000.0000 mL | Freq: Once | INTRAVENOUS | Status: AC
  Administered 2019-03-22: 2000 mL via INTRAVENOUS

## 2019-03-22 NOTE — ED Notes (Signed)
Called PC  Recommendation get etoh level  Must likely see a false elevated creatine  Monitor glucose  Get a BNP

## 2019-03-22 NOTE — ED Notes (Signed)
Bed: RESB Expected date:  Expected time:  Means of arrival:  Comments: 45 F Drank a bunch of rubbing alcohol

## 2019-03-22 NOTE — ED Provider Notes (Signed)
[facility] DEPT Provider Note   CSN: [id number] Arrival date & time: 03/22/19  2220    History   Chief Complaint Chief Complaint  Patient presents with  . Drug Overdose    LEVEL 5 CAVEAT 2/2 AMS  HPI Brandi Kirk is a 33 y.o. female.     33 year old female with a history of PCOS, hypertension, bipolar 1 disorder, cirrhosis with alcohol addiction presents to the emergency department after being found intoxicated by family.  EMS reports that patient drank 2 bottles of 32 ounce rubbing alcohol at 2130 tonight.  She states that she only had 1.5 tablespoons of rubbing alcohol.  She was found on her couch by EMS, noted to be very confused.  She states that she drank tonight with the intention of becoming intoxicated.  Denies any suicidal intent.  No homicidal ideations or illicit drug use.  She is feeling slightly nauseous at the moment.  The history is provided by the patient. No language interpreter was used.  Drug Overdose    Past Medical History:  Diagnosis Date  . Alcohol addiction ([place])   . Back pain 03/18/2016  . Bipolar 1 disorder ([place])   . Blood in stool 07/2017   internal hemorrhoid   . Chicken pox   . Cirrhosis with alcoholism ([place])   . Depression   . Fractured rib 2019  . Frequent headaches   . Frequent UTI   . GERD (gastroesophageal reflux disease)   . HPV in female   . Hypertension   . Migraine   . PCOS (polycystic ovarian syndrome)   . Prediabetes   . Vocal cord dysfunction     Patient Active Problem List   Diagnosis Date Noted  . Tachycardia 01/12/2019  . Essential hypertension 01/12/2019  . Class 3 severe obesity due to excess calories with body mass index (BMI) of 40.0 to 44.9 in adult ([place]) 04/23/2018  . Insomnia 04/20/2018  . Prediabetes   . Alcohol use disorder, mild, abuse 03/15/2018  . Multiple closed fractures of ribs of right side 03/15/2018  . Elevated LFTs 03/15/2018  . Depression with anxiety 03/15/2018   . Polysubstance abuse ([place]) 11/18/2017  . Hypomagnesemia 11/17/2017  . Asthma 11/25/2016  . Cirrhosis with alcoholism [facility])     Past Surgical History:  Procedure Laterality Date  . WISDOM TOOTH EXTRACTION       OB History    Gravida  1   Para  1   Term      Preterm      AB      Living  1     SAB      TAB      Ectopic      Multiple      Live Births               Home Medications    Prior to Admission medications   Medication Sig Start Date End Date Taking? Authorizing Provider  DULoxetine (CYMBALTA) 60 MG capsule Take 1 capsule (60 mg total) by mouth daily. 02/02/19  Yes Kuneff, Renee A, DO  gabapentin (NEURONTIN) 300 MG capsule Take 1 capsule (300 mg total) by mouth at bedtime. 02/02/19  Yes Kuneff, Renee A, DO  levonorgestrel (MIRENA) 20 MCG/24HR IUD 1 each by Intrauterine route continuous.    Yes [provider]  metoprolol succinate (TOPROL-XL) 50 MG 24 hr tablet Take 1 tablet (50 mg total) by mouth daily. Take with or immediately following a meal. 01/26/19  Yes  Kuneff, Renee A, DO  omeprazole (PRILOSEC) 20 MG capsule Take 1 capsule (20 mg total) by mouth daily. 02/01/19  Yes Kuneff, Renee A, DO  QUEtiapine (SEROQUEL) 100 MG tablet Take 3 tablets (300 mg total) by mouth at bedtime. 02/02/19  Yes Kuneff, Renee A, DO  thiamine 100 MG tablet Take 100 mg by mouth daily.   Yes [provider]  naltrexone (DEPADE) 50 MG tablet Take 1 tablet (50 mg total) by mouth daily. Patient not taking: Reported on 03/23/2019 02/01/19   [name], DO    Family History Family History  Problem Relation Age of Onset  . Diabetes Mother   . Stroke Mother 26  . Hypertension Mother   . Heart murmur Mother   . Alcohol abuse Mother   . Arthritis Mother   . Depression Mother   . Early death Mother   . Hyperlipidemia Mother   . Kidney disease Mother   . Mental illness Mother   . Epilepsy Mother   . Rheum arthritis Sister   . Heart murmur Sister   . Alcohol  abuse Sister   . Bipolar disorder Sister   . Cirrhosis Sister   . Heart murmur Brother   . Alcohol abuse Brother   . Depression Brother   . Mental illness Brother        bipolar  . Alcohol abuse Father   . Depression Father   . Mental illness Father   . Arthritis Maternal Grandmother   . Birth defects Maternal Grandmother   . Hyperlipidemia Maternal Grandmother   . Hyperlipidemia Maternal Grandfather   . Alcohol abuse Maternal Grandfather   . Arthritis Maternal Grandfather   . Diabetes Maternal Grandfather   . Stroke Maternal Grandfather   . Arthritis Paternal Grandmother   . Arthritis Paternal Grandfather   . Birth defects Brother   . Mental illness Brother        bipolar  . Depression Brother   . Mental illness Brother     Social History Social History   Tobacco Use  . Smoking status: Current Some Day Smoker    Packs/day: 0.25    Years: 15.00    Pack years: 3.75    Types: Cigarettes  . Smokeless tobacco: Never Used  . Tobacco comment: refused  Substance Use Topics  . Alcohol use: Yes    Comment: Binge drinker  . Drug use: No     Allergies   Ciprofloxacin, Septra [sulfamethoxazole-trimethoprim], and Tylenol [acetaminophen]   Review of Systems Review of Systems  Unable to perform ROS: Mental status change     Physical Exam Updated Vital Signs BP 116/66 (BP Location: Left Arm)   Pulse 100   Temp 97.9 F (36.6 C) (Oral)   Resp (!) 23   SpO2 96%   Physical Exam Vitals signs and nursing note reviewed.  Constitutional:      General: She is not in acute distress.    Appearance: She is well-developed. She is not diaphoretic.     Comments: Patient in NAD. Lethargic. Obese.  HENT:     Head: Normocephalic and atraumatic.  Eyes:     General: No scleral icterus.    Conjunctiva/sclera: Conjunctivae normal.  Neck:     Musculoskeletal: Normal range of motion.  Cardiovascular:     Rate and Rhythm: Normal rate and regular rhythm.     Pulses: Normal  pulses.  Pulmonary:     Effort: Pulmonary effort is normal. No respiratory distress.     Comments:  Respirations even and unlabored Musculoskeletal: Normal range of motion.  Skin:    General: Skin is warm and dry.     Coloration: Skin is not pale.     Findings: No erythema or rash.  Neurological:     Mental Status: She is alert. She is disoriented.     Comments: Alert to self. Unable to specify city, year, but can identify president. Moving all extremities spontaneously.  Psychiatric:        Speech: Speech is slurred.        Behavior: Behavior is slowed.        Thought Content: Thought content does not include homicidal or suicidal ideation.      ED Treatments / Results  Labs (all labs ordered are listed, but only abnormal results are displayed) Labs Reviewed  BLOOD GAS, ARTERIAL - Abnormal; Notable for the following components:      Result Value   pO2, Arterial 74.0 (*)    All other components within normal limits  LACTIC ACID, PLASMA - Abnormal; Notable for the following components:   Lactic Acid, Venous 3.5 (*)    All other components within normal limits  ACETAMINOPHEN LEVEL - Abnormal; Notable for the following components:   Acetaminophen (Tylenol), Serum <10 (*)    All other components within normal limits  COMPREHENSIVE METABOLIC PANEL - Abnormal; Notable for the following components:   Glucose, Bld 101 (*)    Calcium 8.4 (*)    AST 74 (*)    ALT 65 (*)    All other components within normal limits  OSMOLALITY - Abnormal; Notable for the following components:   Osmolality 348 (*)    All other components within normal limits  CBC WITH DIFFERENTIAL/PLATELET - Abnormal; Notable for the following components:   WBC 3.3 (*)    MCV 102.9 (*)    Neutro Abs 0.8 (*)    All other components within normal limits  LACTIC ACID, PLASMA - Abnormal; Notable for the following components:   Lactic Acid, Venous 2.2 (*)    All other components within normal limits  RAPID URINE DRUG  SCREEN, HOSP PERFORMED  ETHANOL  SALICYLATE LEVEL  BRAIN NATRIURETIC PEPTIDE  CBC WITH DIFFERENTIAL/PLATELET  VOLATILES,BLD-ACETONE,ETHANOL,ISOPROP,METHANOL  PATHOLOGIST SMEAR REVIEW  I-STAT BETA HCG BLOOD, ED ([facility], [facility], [facility] ONLY)  CBG MONITORING, ED  CBG MONITORING, ED  CBG MONITORING, ED  CBG MONITORING, ED    EKG EKG Interpretation  Date/Time:  Tuesday March 22 2019 23:20:42 EDT Ventricular Rate:  86 PR Interval:    QRS Duration: 100 QT Interval:  406 QTC Calculation: 486 R Axis:   110 Text Interpretation:  Sinus rhythm Borderline right axis deviation Borderline prolonged QT interval Baseline wander in lead(s) I No significant change since last tracing Confirmed by Orpah Greek [phone]) on 03/22/2019 11:34:05 PM   Radiology No results found.  Procedures Procedures (including critical care time)  Medications Ordered in ED Medications  sodium chloride 0.9 % bolus 2,000 mL (0 mLs Intravenous Stopped 03/23/19 0117)  ondansetron (ZOFRAN) injection 4 mg (4 mg Intravenous Given 03/22/19 2347)  0.9 %  sodium chloride infusion ( Intravenous Stopped 03/23/19 5027)     Initial Impression / Assessment and Plan / ED Course  I have reviewed the triage vital signs and the nursing notes.  Pertinent labs & imaging results that were available during my care of the patient were reviewed by me and considered in my medical decision making (see chart for details).  79:31 PM 33 year old female presents to the emergency department for evaluation of acute intoxication.  She reportedly drank 2 32-ounce bottles of rubbing alcohol. Is clinically intoxicated with slurred speech. Easily aroused. Will obtain labs and continue to monitor.  Will plan to hydrate with IVF.  1:35 AM Patient able to stand unassisted to go to the restroom.  She remains hemodynamically stable.  5:17 AM Patient much more alert. Up and ambulatory again to bathroom; steady gait.  5:57 AM Repeat lactate  improved.  6:10 AM Spoke with poison control.  They were updated on the patient's improving lactate as well as osmolality.  Poison control reports that if patient is alert and oriented, not vomiting, she can be safely discharged.  Patient, on repeat assessment, is easily alert.  Oriented to person, place, time.  States that she is tired, but denies any other complaints.   Vitals:   03/23/19 0400 03/23/19 0500 03/23/19 0504 03/23/19 0600  BP: (!) 116/57 (!) 102/58 (!) 102/58 116/66  Pulse: (!) 102 100 100 100  Resp: 16  16 (!) 23  Temp:   97.8 F (36.6 C) 97.9 F (36.6 C)  TempSrc:   Oral Oral  SpO2: 93% 100% 100% 96%    Final Clinical Impressions(s) / ED Diagnoses   Final diagnoses:  Acute substance intoxication [facility])    ED Discharge Orders    None       Antonietta Breach, PA-C 03/23/19 5396    Orpah Greek, MD 03/23/19 [phone]

## 2019-03-22 NOTE — ED Triage Notes (Signed)
Pt comes to ed via ems, c/o drank rubbing alcohol two 32 oz bottles at 9 :30pm, ems found her on the couch, called out by family.  Pt was very confused. Hx of etoh abuse.  V/s on arrival bp 138/78 rr18, cbg 116. 20 gage in left arm.

## 2019-03-23 LAB — ACETAMINOPHEN LEVEL: Acetaminophen (Tylenol), Serum: <10 ug/mL — ABNORMAL LOW (ref 10–30)

## 2019-03-23 LAB — BRAIN NATRIURETIC PEPTIDE: B Natriuretic Peptide: 40.8 pg/mL (ref 0.0–100.0)

## 2019-03-23 LAB — CBC WITH DIFFERENTIAL/PLATELET
Abs Immature Granulocytes: 0.02 10*3/uL (ref 0.00–0.07)
Basophils Absolute: 0 10*3/uL (ref 0.0–0.1)
Basophils Relative: 1 %
Eosinophils Absolute: 0.1 10*3/uL (ref 0.0–0.5)
Eosinophils Relative: 2 %
HCT: 42.9 % (ref 36.0–46.0)
Hemoglobin: 14.1 g/dL (ref 12.0–15.0)
Immature Granulocytes: 1 %
Lymphocytes Relative: 52 %
Lymphs Abs: 1.7 10*3/uL (ref 0.7–4.0)
MCH: 33.8 pg (ref 26.0–34.0)
MCHC: 32.9 g/dL (ref 30.0–36.0)
MCV: 102.9 fL — ABNORMAL HIGH (ref 80.0–100.0)
Monocytes Absolute: 0.6 10*3/uL (ref 0.1–1.0)
Monocytes Relative: 20 %
Neutro Abs: 0.8 10*3/uL — ABNORMAL LOW (ref 1.7–7.7)
Neutrophils Relative %: 24 %
Platelets: 218 10*3/uL (ref 150–400)
RBC: 4.17 MIL/uL (ref 3.87–5.11)
RDW: 14.1 % (ref 11.5–15.5)
WBC: 3.3 10*3/uL — ABNORMAL LOW (ref 4.0–10.5)
nRBC: 0 % (ref 0.0–0.2)

## 2019-03-23 LAB — SALICYLATE LEVEL: Salicylate Lvl: <7 mg/dL (ref 2.8–30.0)

## 2019-03-23 LAB — COMPREHENSIVE METABOLIC PANEL
ALT: 65 U/L — ABNORMAL HIGH (ref 0–44)
AST: 74 U/L — ABNORMAL HIGH (ref 15–41)
Albumin: 3.9 g/dL (ref 3.5–5.0)
Alkaline Phosphatase: 65 U/L (ref 38–126)
Anion gap: 12 (ref 5–15)
BUN: 10 mg/dL (ref 6–20)
CO2: 25 mmol/L (ref 22–32)
Calcium: 8.4 mg/dL — ABNORMAL LOW (ref 8.9–10.3)
Chloride: 105 mmol/L (ref 98–111)
Creatinine, Ser: 0.94 mg/dL (ref 0.44–1.00)
GFR calc Af Amer: >60 mL/min (ref >60)
GFR calc non Af Amer: >60 mL/min (ref >60)
Glucose, Bld: 101 mg/dL — ABNORMAL HIGH (ref 70–99)
Potassium: 4.2 mmol/L (ref 3.5–5.1)
Sodium: 142 mmol/L (ref 135–145)
Total Bilirubin: 0.5 mg/dL (ref 0.3–1.2)
Total Protein: 7.4 g/dL (ref 6.5–8.1)

## 2019-03-23 LAB — ETHANOL: Alcohol, Ethyl (B): <10 mg/dL (ref <10)

## 2019-03-23 LAB — LACTIC ACID, PLASMA: Lactic Acid, Venous: 2.2 mmol/L (ref 0.5–1.9)

## 2019-03-23 LAB — RAPID URINE DRUG SCREEN, HOSP PERFORMED
Amphetamines: NOT DETECTED
Barbiturates: NOT DETECTED
Benzodiazepines: NOT DETECTED
Cocaine: NOT DETECTED
Opiates: NOT DETECTED
Tetrahydrocannabinol: NOT DETECTED

## 2019-03-23 LAB — VOLATILES,BLD-ACETONE,ETHANOL,ISOPROP,METHANOL
Acetone, blood: 0.05 % — ABNORMAL HIGH (ref 0.000–0.010)
Ethanol, blood: NEGATIVE % (ref 0.000–0.010)
Isopropanol, blood: 0.177 % — ABNORMAL HIGH (ref 0.000–0.010)
Methanol, blood: NEGATIVE % (ref 0.000–0.010)

## 2019-03-23 LAB — CBG MONITORING, ED
Glucose-Capillary: 79 mg/dL (ref 70–99)
Glucose-Capillary: 83 mg/dL (ref 70–99)
Glucose-Capillary: 90 mg/dL (ref 70–99)

## 2019-03-23 LAB — OSMOLALITY: Osmolality: 348 mOsm/kg (ref 275–295)

## 2019-03-23 LAB — PATHOLOGIST SMEAR REVIEW

## 2019-03-23 MED ORDER — SODIUM CHLORIDE 0.9 % IV SOLN
Freq: Once | INTRAVENOUS | Status: AC
  Administered 2019-03-23: 05:00:00 via INTRAVENOUS

## 2019-03-23 NOTE — ED Notes (Signed)
Ignore v/s @6pm  false read

## 2019-03-23 NOTE — Discharge Instructions (Addendum)
Avoid drinking alcohol of any kind as this is bad for your health, especially given your history of liver cirrhosis. Follow up with your primary care doctor as needed. Return for any new or concerning symptoms.

## 2019-03-23 NOTE — ED Notes (Signed)
cbg 90

## 2019-03-23 NOTE — ED Notes (Signed)
Spoke w PC , will call back at 6pm for repeat lactic results

## 2019-03-23 NOTE — ED Notes (Signed)
Walked pt to restroom w success

## 2019-04-14 ENCOUNTER — Other Ambulatory Visit: Payer: Self-pay

## 2019-04-25 ENCOUNTER — Telehealth: Payer: Self-pay

## 2019-04-25 NOTE — Telephone Encounter (Signed)
rcvd a fax from [facility] in [place] for Duloxetine. Called patient and was unable to leave vm due to mailbox being full. Patient needs to come in for a [facility]. She is overdue for a f/u

## 2019-05-19 NOTE — Telephone Encounter (Signed)
Received another faxed refill request for Gabapentin. Pt was called and scheduled for her F/U appt

## 2019-05-20 NOTE — Telephone Encounter (Signed)
RF request for Gabapentin 347m capsule  LOV: 02/01/2019 Next ov: 06/01/2019 Last written: 02/02/2019 #30 x1 refill   Pt will be out of medication. Received another faxed refill request for medication. Please advise if enough can be sent until pts appt

## 2019-06-01 ENCOUNTER — Ambulatory Visit (INDEPENDENT_AMBULATORY_CARE_PROVIDER_SITE_OTHER): Payer: 59 | Admitting: Family Medicine

## 2019-06-01 ENCOUNTER — Telehealth: Payer: Self-pay | Admitting: Family Medicine

## 2019-06-01 ENCOUNTER — Encounter: Payer: Self-pay | Admitting: Family Medicine

## 2019-06-01 ENCOUNTER — Other Ambulatory Visit: Payer: Self-pay

## 2019-06-01 VITALS — BP 129/81 | HR 110 | Temp 97.8°F | Resp 21 | Ht 64.0 in | Wt 280.0 lb

## 2019-06-01 DIAGNOSIS — Z23 Encounter for immunization: Secondary | ICD-10-CM

## 2019-06-01 DIAGNOSIS — F319 Bipolar disorder, unspecified: Secondary | ICD-10-CM | POA: Insufficient documentation

## 2019-06-01 DIAGNOSIS — G4700 Insomnia, unspecified: Secondary | ICD-10-CM

## 2019-06-01 DIAGNOSIS — F1910 Other psychoactive substance abuse, uncomplicated: Secondary | ICD-10-CM

## 2019-06-01 DIAGNOSIS — R000 Tachycardia, unspecified: Secondary | ICD-10-CM

## 2019-06-01 DIAGNOSIS — G259 Extrapyramidal and movement disorder, unspecified: Secondary | ICD-10-CM

## 2019-06-01 DIAGNOSIS — I10 Essential (primary) hypertension: Secondary | ICD-10-CM | POA: Diagnosis not present

## 2019-06-01 DIAGNOSIS — Z6841 Body Mass Index (BMI) 40.0 and over, adult: Secondary | ICD-10-CM

## 2019-06-01 DIAGNOSIS — F418 Other specified anxiety disorders: Secondary | ICD-10-CM

## 2019-06-01 DIAGNOSIS — R7989 Other specified abnormal findings of blood chemistry: Secondary | ICD-10-CM

## 2019-06-01 DIAGNOSIS — F1010 Alcohol abuse, uncomplicated: Secondary | ICD-10-CM

## 2019-06-01 DIAGNOSIS — R945 Abnormal results of liver function studies: Secondary | ICD-10-CM

## 2019-06-01 DIAGNOSIS — K7030 Alcoholic cirrhosis of liver without ascites: Secondary | ICD-10-CM

## 2019-06-01 MED ORDER — METOPROLOL SUCCINATE ER 50 MG PO TB24: 50.0000 mg | ORAL_TABLET | Freq: Every day | ORAL | 1 refills | Status: DC

## 2019-06-01 MED ORDER — NALTREXONE HCL 50 MG PO TABS: 50.0000 mg | ORAL_TABLET | Freq: Every day | ORAL | 3 refills | Status: DC

## 2019-06-01 MED ORDER — OMEPRAZOLE 20 MG PO CPDR: 20.0000 mg | DELAYED_RELEASE_CAPSULE | Freq: Every day | ORAL | 5 refills | Status: DC

## 2019-06-01 MED ORDER — DULOXETINE HCL 60 MG PO CPEP: 60.0000 mg | ORAL_CAPSULE | Freq: Every day | ORAL | 1 refills | Status: DC

## 2019-06-01 MED ORDER — GABAPENTIN 300 MG PO CAPS: 300.0000 mg | ORAL_CAPSULE | Freq: Every day | ORAL | 1 refills | Status: DC

## 2019-06-01 MED ORDER — QUETIAPINE FUMARATE 300 MG PO TABS: 300.0000 mg | ORAL_TABLET | Freq: Every day | ORAL | 1 refills | Status: DC

## 2019-06-01 NOTE — Telephone Encounter (Signed)
Please inform patient the following information: Patient was seen on Wednesday for her chronic conditions.  I had wanted to collect labs on her to check her B vitamins and an ammonia level to see if because of her shaking.  However labs were not collected.  Please call her and set her up for a lab appointment only.

## 2019-06-01 NOTE — Progress Notes (Signed)
SUBJECTIVE Chief Complaint  Patient presents with  . Anxiety    Pt is doing well today with no complaints. Pt took BP medications at 12pm  . Hypertension    HPI:  Brandi Kirk is a 33 y.o. female present for follow up on chronic medical conditions. Patient has been followed for her alcohol abuse along with her anxiety, depression, bipolar 1 disorder, elevated liver enzymes, alcoholic cirrhosis and movement disorder.  She was last seen in May for the above conditions and was asked to follow-up in 6 weeks.  She was lost to follow-up.  She has been seen in the emergency room on 2 occasions in June for alcohol intoxication.  Which today she states she does not even remember at least 1 of the occasions.  It was reported in the note she had ingested rubbing alcohol.  She states that her husband monitors her alcohol consumption.  She reports she is allowed to have 1 to 2 glasses of "cocktails "every other day.  She has started to attend AA meetings since we saw each other last.  She states sometimes it is helpful and sometimes it makes her think about drinking more.  She reports 1 of the time she was in the emergency room she remembers drinking with her friends and she ended up having more drinks than she intended.  She reports her anxiety is still an issue although with a little better controlled with the addition of Cymbalta at her last visit.  She has been completely off her gabapentin and her movement disorder has continued.  Labs collected in her ED visit 6/23 include CBC with low WBC 3.3 and elevated MCV at 102.9.  Normal acetaminophen and salicylate levels.  Lactic acid 2.2.  Osmolality 348.  BNP 40.  Negative hCG.  She had been encouraged to have the hepatitis a and B vaccinations.  She had 1 of the hepatitis B vaccinations and did not complete series.  Prior note: Patient reports she could use some improvement on her depression and anxiety coverage.  She is tolerating the Seroquel and does not  feel it makes her sleepy.  Does feel like it is making her want to eat more.  She is taking the gabapentin 600 mg nightly.  She feels she is having jerky all over body muscle movements over the past few months and is uncertain the cause or if it is related to her medications.  He has not been taking the naltrexone for a few months secondary to running out of medication.  She is still drinking 1-2 drinks a few times a week with her husband, but is never drinking more than 2 drinks now.  She reports some nausea feeling when using the naltrexone.  However if it will help her move away from drinking, she would like to stay on the medication.  She is taking the B complex supplement.  She feels her restless leg at night is not well controlled.  ROS: See pertinent positives and negatives per HPI.   Patient Active Problem List   Diagnosis Date Noted  . Tachycardia 01/12/2019  . Essential hypertension 01/12/2019  . Class 3 severe obesity due to excess calories with body mass index (BMI) of 40.0 to 44.9 in adult ([place]) 04/23/2018  . Insomnia 04/20/2018  . Prediabetes   . Alcohol use disorder, mild, abuse 03/15/2018  . Multiple closed fractures of ribs of right side 03/15/2018  . Elevated LFTs 03/15/2018  . Depression with anxiety 03/15/2018  .  Polysubstance abuse ([place]) 11/18/2017  . Hypomagnesemia 11/17/2017  . Asthma 11/25/2016  . Cirrhosis with alcoholism ([place])     Social History   Tobacco Use  . Smoking status: Current Some Day Smoker    Packs/day: 0.25    Years: 15.00    Pack years: 3.75    Types: Cigarettes  . Smokeless tobacco: Never Used  . Tobacco comment: refused  Substance Use Topics  . Alcohol use: Yes    Comment: Binge drinker    Current Outpatient Medications:  .  DULoxetine (CYMBALTA) 60 MG capsule, Take 1 capsule (60 mg total) by mouth daily., Disp: 90 capsule, Rfl: 0 .  gabapentin (NEURONTIN) 300 MG capsule, Take 1 capsule (300 mg total) by mouth at bedtime., Disp: 30  capsule, Rfl: 1 .  levonorgestrel (MIRENA) 20 MCG/24HR IUD, 1 each by Intrauterine route continuous. , Disp: , Rfl:  .  metoprolol succinate (TOPROL-XL) 50 MG 24 hr tablet, Take 1 tablet (50 mg total) by mouth daily. Take with or immediately following a meal., Disp: 90 tablet, Rfl: 1 .  naltrexone (DEPADE) 50 MG tablet, Take 1 tablet (50 mg total) by mouth daily., Disp: 30 tablet, Rfl: 3 .  omeprazole (PRILOSEC) 20 MG capsule, Take 1 capsule (20 mg total) by mouth daily., Disp: 30 capsule, Rfl: 5 .  QUEtiapine (SEROQUEL) 100 MG tablet, Take 3 tablets (300 mg total) by mouth at bedtime., Disp: 270 tablet, Rfl: 0 .  thiamine 100 MG tablet, Take 100 mg by mouth daily., Disp: , Rfl:   Allergies  Allergen Reactions  . Ciprofloxacin Other (See Comments)    intolerance  . Septra [Sulfamethoxazole-Trimethoprim] Other (See Comments)    Thrush   . Tylenol [Acetaminophen] Other (See Comments)    Patient stated she can't take it due to liver    OBJECTIVE: BP 129/81 (BP Location: Right Arm, Patient Position: Sitting, Cuff Size: Normal) Comment: unable to obtain  Pulse (!) 110   Temp 97.8 F (36.6 C) (Temporal)   Resp (!) 21   Ht 5' 4"  (1.626 m)   Wt 280 lb (127 kg)   LMP  (LMP Unknown)   SpO2 98%   BMI 48.06 kg/m  Gen: Afebrile. No acute distress.  Nontoxic in presentation.  Pleasant Caucasian female.  Obese. HENT: AT. [place]. Eyes:Pupils Equal Round Reactive to light, Extraocular movements intact,  Conjunctiva without redness, discharge or icterus. CV: RRR no murmur, no edema Chest: CTAB, no wheeze or crackles Abd: Soft.  Obese. NTND. BS present.  No masses palpated.  Skin: No rashes, purpura or petechiae.  Neuro:  Normal gait. PERLA. EOMi. Alert. Oriented.  Restlessness of bilateral upper extremities. Psych: Moderately anxious.  Normal dress and demeanor. Normal speech. Normal thought content   Depression screen [facility] 2/9 06/01/2019 02/01/2019 04/20/2018 04/09/2018 03/15/2018  Decreased Interest 1  3 1 2  0  Down, Depressed, Hopeless 2 1 1 3 3   PHQ - 2 Score 3 4 2 5 3   Altered sleeping 3 3 3 3 3   Tired, decreased energy 2 3 3 3 3   Change in appetite 2 3 3 3 3   Feeling bad or failure about yourself  1 1 - 3 3  Trouble concentrating 1 0 1 2 1   Moving slowly or fidgety/restless 0 3 1 2  0  Suicidal thoughts 0 0 0 0 0  PHQ-9 Score 12 17 13 21 16   Difficult doing work/chores Somewhat difficult Somewhat difficult Somewhat difficult - -   GAD 7 : Generalized Anxiety Score  06/01/2019 02/01/2019 04/09/2018  Nervous, Anxious, on Edge 2 3 3   Control/stop worrying 1 0 3  Worry too much - different things 1 3 3   Trouble relaxing 2 3 3   Restless 0 0 1  Easily annoyed or irritable 0 3 3  Afraid - awful might happen 0 1 1  Total GAD 7 Score 6 13 17   Anxiety Difficulty Somewhat difficult Not difficult at all -    ASSESSMENT AND PLAN: Myli Pae is a 33 y.o. female present for  Alcoholic cirrhosis, unspecified whether ascites present (HCC)/eleavted LFT -Lengthy and honest discussion today surrounding her alcoholism.  She is harming her body tremendously.  Encouraged her to stop drinking altogether, she has proven she is not able to have a few drinks and I would encourage her to start treating her alcoholism as the disease that it is by refraining from all alcohol.  I encouraged her to look into inpatient and outpatient rehabilitation and provided her with a list of local clinics today. -We also discussed referral to psychiatry, I do believe she needs to be managed by psychiatry for her alcohol abuse and mental illnesses. - discussed referral to liver clinic- she declined. >>concerned movement d/o is potentially asterterix from liver disease.  - continue depade- refilled- discussed disulfiram and she declined.  - continue prilosec- refilled - congratulated her on starting to attend AA meetings.  Would encourage her to continue. - Vitamin B1; Future - B12 and Folate Panel; Future - Ammonia; Future >>  consider lactulose  Depression with anxiety/ Insomnia, unspecified type/Alcohol use disorder, mild, abuse/bipolar 1 d/o  -We also discussed referral to psychiatry, I do believe she needs to be managed by psychiatry for her alcohol abuse and mental illnesses. - Continue Gabapentin 300 mg QHS. She was tried off med and still had mvmt d/o) -Continue Seroquel  300 mg nightly (divided doses caused her to overeat) - Continue Cymbalta 60 mg in the morning  - She does desire to stay on the naltrexone -refilled for her - Continue thiamine QD -Tried medications: BuSpar, Prozac, Vistaril, nortriptyline, trazodone, Zoloft, gabapentin, Seroquel and Cymbalta  Movement disorder:  -The discussion with the patient surrounding her jerking muscle movements.  This has been proven not to be a side effect to gabapentin that she was tapered off gabapentin and still has had movements.  On examination today movements did stop completely on a few occasions when patient was distracted.  I believe there is at least some psychological component to her movements.  Could also consider DTs since she continues to drink 1-2 drinks about every other day. Lastly and more concerning potential cause is asterixis from liver disease. These were all discussed with her today.  -Discussed referral to neurology today to further evaluate her movements.  She declined for now.  She wants to work on her alcoholism and hopefully stop drinking and see if they completely resolve. - discussed referral to liver clinic- she declined.  - Vitamin B1; Future - B12 and Folate Panel; Future - Ammonia; Future  Need for influenza vaccination - Flu Vaccine QUAD 36+ mos IM  Essential hypertension/tachycardia -Blood pressure stable.  Patient still with tachycardia today although improved.  Continue metoprolol. -Reviewed CBC and CMP collected in ED. - metoprolol succinate (TOPROL-XL) 50 MG 24 hr tablet; Take 1 tablet (50 mg total) by mouth daily. Take  with or immediately following a meal.  Dispense: 90 tablet; Refill: 1  F/u 6 mos- as long as followed by psych. If does not  establish w/psych f/u 3 months.    > 25 minutes spent with patient, >50% of time spent face to face counseling and coordinating care.     Howard Pouch, DO 06/01/2019  \

## 2019-06-01 NOTE — Patient Instructions (Signed)
I have referred you to psychiatry. I believe you would benefit from their services with your anxiety and addiction.  Please continue to go to meetings and look into outpatient/inpatient assistance for your alcohol use.   I have refilled your medications. Follow up here in 6 months.  [email or web address]   Substance Use Disorder Substance use disorder occurs when a person's repeated use of drugs or alcohol interferes with his or her ability to be productive. This disorder can cause problems with mental and physical health. It can affect your ability to have healthy relationships, and it can keep you from being able to meet your responsibilities at work, home, or school. It can also lead to addiction, which is a condition in which the person cannot stop using the substance consistently for a period of time. Addiction changes the way the brain works. Because of these changes, addiction is a chronic condition. Substance use disorder can be mild, moderate, or severe. The most commonly abused substances include:  Alcohol.  Tobacco.  Marijuana.  Stimulants, such as cocaine and methamphetamine.  Hallucinogens, such as LSD and PCP.  Opioids, such as some prescription pain medicines and heroin. What are the causes? This condition may develop due to many complex social, psychological, or physical reasons, such as:  Stress.  Abuse.  Peer pressure.  Anxiety or depression. What increases the risk? This condition is more likely to develop in people who:  Use substances to cope with stress.  Have been abused.  Have a mental health disorder, such as depression.  Have a family history of substance use disorder. What are the signs or symptoms? Symptoms of this condition include:  Using the substance for longer periods of time or at a higher dosage than what is normal or intended.  Having a lasting desire to use the substance.  Being unable to slow down  or stop the use of the substance.  Spending an abnormal amount of time getting the substance, using the substance, or recovering from using the substance.  Using the substance in a way that interferes with work, school, social activities, and personal relationships.  Using the substance even after having negative consequences, such as: ? Health problems. ? Legal or financial troubles. ? Job loss. ? Relationship problems.  Needing more and more of the substance to get the same effect (developing tolerance).  Experiencing unpleasant symptoms if you do not use the substance (withdrawal).  Using the substance to avoid withdrawal symptoms. How is this diagnosed? This condition may be diagnosed based on:  A physical exam.  Your history of substance use.  Your symptoms. This includes: ? How substance use affects your life. ? Changes in personality, behaviors, and mood. ? Having at least two symptoms of substance use disorder within a 48-monthperiod. ? Health issues related to substance use, such as liver damage, shortness of breath, fatigue, cough, or heart problems.  Blood or urine tests to screen for alcohol and drugs. How is this treated? This condition may be treated by:  Stopping substance use safely. This may require taking medicines and being closely monitored for several days.  Taking part in group and individual counseling from mental health providers who help people with substance use disorder.  Staying at a live-in (residential) treatment center for several days or weeks.  Attending daily counseling sessions at a treatment center.  Taking medicine as told by your health care provider: ? To ease symptoms and prevent complications during withdrawal. ? To treat other mental health issues,  such as depression or anxiety. ? To block cravings by causing the same effects as the substance. ? To block the effects of the substance or replace good sensations with unpleasant  ones.  Participating in a support group to share your experience with others who are going through the same thing. These groups are an important part of long-term recovery for many people. Recovery can be a long process. Many people who undergo treatment start using the substance again after stopping (relapse). If you relapse, that does not mean that treatment will not work. Follow these instructions at home:   Take over-the-counter and prescription medicines only as told by your health care provider.  Do not use any drugs or alcohol.  Avoid temptations or triggers that you associate with your use of the substance.  Learn and practice techniques for managing stress.  Have a plan for vulnerable moments. Get phone numbers of people who are willing to help and who are committed to your recovery.  Attend support groups on a regular basis. These groups include 12-step programs like Alcoholics Anonymous and Narcotics Anonymous.  Keep all follow-up visits as told by your health care providers. This is important. This includes continuing to work with therapists and support groups. Contact a health care provider if:  You cannot take your medicines as told.  Your symptoms get worse.  You have trouble resisting the urge to use drugs or alcohol. Get help right away if you:  Relapse.  Think that you may have taken too much of a drug. The hotline of the [facility] is 817-753-1235.  Have signs of an overdose. Symptoms include: ? Chest pain. ? Confusion. ? Sleepiness or difficulty staying awake. ? Slowed breathing. ? Nausea or vomiting. ? A seizure.  Have serious thoughts about hurting yourself or someone else. Drug overdose is an emergency. Do not wait to see if the symptoms will go away. Get medical help right away. Call your local emergency services (911 in the [place] Do not drive yourself to the hospital. If you ever feel like you may hurt yourself or others, or  have thoughts about taking your own life, get help right away. You can go to your nearest emergency department or call:  Your local emergency services (911 in the [place]  A suicide crisis helpline, such as the [facility] at (315) 667-6068. This is open 24 hours a day. Summary  Substance use disorder occurs when a person's repeated use of drugs or alcohol interferes with his or her ability to be productive.  Taking part in group and individual counseling from mental health providers is a common treatment for people with substance use disorder.  Recovery can be a long process. Many people who undergo treatment start using the substance again after stopping (relapse). A relapse does not mean that treatment will not work.  Attend support groups such as Alcoholics Anonymous and Narcotics Anonymous. These groups are an important part of long-term recovery for many people. This information is not intended to replace advice given to you by your health care provider. Make sure you discuss any questions you have with your health care provider. Document Released: 05/07/2005 Document Revised: 01/06/2019 Document Reviewed: 10/27/2017 Elsevier Patient Education  2020 [employer].

## 2019-06-02 NOTE — Telephone Encounter (Signed)
Patient is scheduled for tomorrow at 46.

## 2019-06-03 ENCOUNTER — Ambulatory Visit (INDEPENDENT_AMBULATORY_CARE_PROVIDER_SITE_OTHER): Payer: 59

## 2019-06-03 ENCOUNTER — Other Ambulatory Visit: Payer: Self-pay

## 2019-06-03 DIAGNOSIS — F1910 Other psychoactive substance abuse, uncomplicated: Secondary | ICD-10-CM

## 2019-06-03 DIAGNOSIS — F1010 Alcohol abuse, uncomplicated: Secondary | ICD-10-CM

## 2019-06-03 DIAGNOSIS — G259 Extrapyramidal and movement disorder, unspecified: Secondary | ICD-10-CM

## 2019-06-03 DIAGNOSIS — K7030 Alcoholic cirrhosis of liver without ascites: Secondary | ICD-10-CM

## 2019-06-07 LAB — AMMONIA: Ammonia: 107 umol/L — ABNORMAL HIGH (ref <=72)

## 2019-06-07 LAB — PROTIME-INR
INR: 1.2 — ABNORMAL HIGH
Prothrombin Time: 12.3 s — ABNORMAL HIGH (ref 9.0–11.5)

## 2019-06-07 LAB — VITAMIN B1: Vitamin B1 (Thiamine): 10 nmol/L (ref 8–30)

## 2019-06-07 LAB — B12 AND FOLATE PANEL
Folate: 6.6 ng/mL
Vitamin B-12: 419 pg/mL (ref 200–1100)

## 2019-06-08 ENCOUNTER — Telehealth: Payer: Self-pay | Admitting: Family Medicine

## 2019-06-08 DIAGNOSIS — F1010 Alcohol abuse, uncomplicated: Secondary | ICD-10-CM

## 2019-06-08 DIAGNOSIS — R7989 Other specified abnormal findings of blood chemistry: Secondary | ICD-10-CM

## 2019-06-08 DIAGNOSIS — R791 Abnormal coagulation profile: Secondary | ICD-10-CM

## 2019-06-08 DIAGNOSIS — K7030 Alcoholic cirrhosis of liver without ascites: Secondary | ICD-10-CM

## 2019-06-08 MED ORDER — THIAMINE HCL 100 MG PO TABS: 100.0000 mg | ORAL_TABLET | Freq: Every day | ORAL | 1 refills | Status: DC

## 2019-06-08 NOTE — Telephone Encounter (Signed)
Pt was called and was unable to leave VM

## 2019-06-08 NOTE — Telephone Encounter (Signed)
Please inform patient the following information:  - Brandi Kirk b12 and b1 (thiamine) are low normal- I would encourage Brandi Kirk to continue to take the supplements. b12 OTC, thiamine I reordered for Brandi Kirk QD dose.  - She does have changing to Brandi Kirk bleeding time- which is an indication of liver disease. Brandi Kirk ammonia levels are also elevated- which further suggests liver disease from alcohol use.  If the liver is damaged it can not filter toxins and these build up- causing the symptoms she is reporting. I have concerns Brandi Kirk liver disease has progressed. I have referred Brandi Kirk to the liver clinic. I strongly encourage Brandi Kirk to attend that appt. And decrease Brandi Kirk alcohol consumption.

## 2019-06-09 ENCOUNTER — Encounter: Payer: Self-pay | Admitting: Family Medicine

## 2019-06-09 NOTE — Telephone Encounter (Signed)
Pt was called and given information, she verbalizes understanding

## 2019-06-15 ENCOUNTER — Encounter: Payer: Self-pay | Admitting: Gastroenterology

## 2019-06-20 ENCOUNTER — Ambulatory Visit: Payer: 59 | Admitting: Gastroenterology

## 2019-06-20 ENCOUNTER — Encounter: Payer: Self-pay | Admitting: Gastroenterology

## 2019-06-20 ENCOUNTER — Other Ambulatory Visit: Payer: Self-pay

## 2019-06-20 ENCOUNTER — Other Ambulatory Visit (INDEPENDENT_AMBULATORY_CARE_PROVIDER_SITE_OTHER): Payer: 59

## 2019-06-20 VITALS — BP 114/80 | HR 88 | Temp 98.6°F | Ht 63.25 in | Wt 281.1 lb

## 2019-06-20 DIAGNOSIS — F1010 Alcohol abuse, uncomplicated: Secondary | ICD-10-CM

## 2019-06-20 DIAGNOSIS — K7581 Nonalcoholic steatohepatitis (NASH): Secondary | ICD-10-CM | POA: Diagnosis not present

## 2019-06-20 DIAGNOSIS — R945 Abnormal results of liver function studies: Secondary | ICD-10-CM

## 2019-06-20 DIAGNOSIS — R7989 Other specified abnormal findings of blood chemistry: Secondary | ICD-10-CM

## 2019-06-20 DIAGNOSIS — R791 Abnormal coagulation profile: Secondary | ICD-10-CM

## 2019-06-20 LAB — COMPREHENSIVE METABOLIC PANEL
ALT: 83 U/L — ABNORMAL HIGH (ref 0–35)
AST: 38 U/L — ABNORMAL HIGH (ref 0–37)
Albumin: 4.1 g/dL (ref 3.5–5.2)
Alkaline Phosphatase: 91 U/L (ref 39–117)
BUN: 13 mg/dL (ref 6–23)
CO2: 28 mEq/L (ref 19–32)
Calcium: 9.3 mg/dL (ref 8.4–10.5)
Chloride: 101 mEq/L (ref 96–112)
Creatinine, Ser: 0.71 mg/dL (ref 0.40–1.20)
GFR: 95.9 mL/min (ref >60.00)
Glucose, Bld: 112 mg/dL — ABNORMAL HIGH (ref 70–99)
Potassium: 4.2 mEq/L (ref 3.5–5.1)
Sodium: 136 mEq/L (ref 135–145)
Total Bilirubin: 0.6 mg/dL (ref 0.2–1.2)
Total Protein: 7.3 g/dL (ref 6.0–8.3)

## 2019-06-20 LAB — CBC WITH DIFFERENTIAL/PLATELET
Basophils Absolute: 0.1 10*3/uL (ref 0.0–0.1)
Basophils Relative: 1.1 % (ref 0.0–3.0)
Eosinophils Absolute: 0.1 10*3/uL (ref 0.0–0.7)
Eosinophils Relative: 1.6 % (ref 0.0–5.0)
HCT: 39.4 % (ref 36.0–46.0)
Hemoglobin: 13.4 g/dL (ref 12.0–15.0)
Lymphocytes Relative: 18.3 % (ref 12.0–46.0)
Lymphs Abs: 1.4 10*3/uL (ref 0.7–4.0)
MCHC: 34 g/dL (ref 30.0–36.0)
MCV: 103.5 fl — ABNORMAL HIGH (ref 78.0–100.0)
Monocytes Absolute: 0.9 10*3/uL (ref 0.1–1.0)
Monocytes Relative: 12.5 % — ABNORMAL HIGH (ref 3.0–12.0)
Neutro Abs: 5 10*3/uL (ref 1.4–7.7)
Neutrophils Relative %: 66.5 % (ref 43.0–77.0)
Platelets: 279 10*3/uL (ref 150.0–400.0)
RBC: 3.81 Mil/uL — ABNORMAL LOW (ref 3.87–5.11)
RDW: 14.3 % (ref 11.5–15.5)
WBC: 7.5 10*3/uL (ref 4.0–10.5)

## 2019-06-20 LAB — FERRITIN: Ferritin: 294.1 ng/mL — ABNORMAL HIGH (ref 10.0–291.0)

## 2019-06-20 LAB — PROTIME-INR
INR: 1 ratio (ref 0.8–1.0)
Prothrombin Time: 12.2 s (ref 9.6–13.1)

## 2019-06-20 NOTE — Progress Notes (Addendum)
Brandi Kirk    [id number]    17-Jun-1986  Primary Care Physician:Kuneff, Reinaldo Raddle, DO  Referring Physician: [name], DO [address] [place],  [place] [address]   Chief complaint: Abnormal LFT  HPI: 33 year old female with morbid obesity and history of alcohol use with liver disease here to establish care  She quit drinking and has been abstaining from EtOH for past 8 days, is planning to go to [facility] meeting She had gained about 50 to 60 pounds in the in the past 6 months  Denies any nausea, vomiting, abdominal pain, melena or bright red blood per rectum Denies any herbal remedies or weight loss medication  Takes ibuprofen and naproxen intermittently.  EGD July 30, 2016: Normal.  Esophageal biopsies negative for EOE or Barrett's esophagus.  Gastric biopsies negative for H. pylori.  Abdominal ultrasound November 25, 2016 showed increased echogenicity of hepatic parenchyma suggestive of fatty infiltration.  Hepatitis A, B, C and HIV negative  June 03, 2019: INR 1.2  Ammonia 107   Hepatic Function Latest Ref Rng & Units 03/22/2019 04/28/2018 04/27/2018  Total Protein 6.5 - 8.1 g/dL 7.4 8.2(H) 8.4(H)  Albumin 3.5 - 5.0 g/dL 3.9 4.4 4.3  AST 15 - 41 U/L 74(H) 68(H) 52(H)  ALT 0 - 44 U/L 65(H) 70(H) 56(H)  Alk Phosphatase 38 - 126 U/L 65 90 89  Total Bilirubin 0.3 - 1.2 mg/dL 0.5 1.1 1.1  Bilirubin, Direct 0.0 - 0.3 mg/dL - - -     Outpatient Encounter Medications as of 06/20/2019  Medication Sig  . DULoxetine (CYMBALTA) 60 MG capsule Take 1 capsule (60 mg total) by mouth daily.  Marland Kitchen gabapentin (NEURONTIN) 300 MG capsule Take 1 capsule (300 mg total) by mouth at bedtime.  Marland Kitchen levonorgestrel (MIRENA) 20 MCG/24HR IUD 1 each by Intrauterine route continuous.   . metoprolol succinate (TOPROL-XL) 50 MG 24 hr tablet Take 1 tablet (50 mg total) by mouth daily. Take with or immediately following a meal.  . Multiple Vitamin (MULTIVITAMIN) tablet Take 1 tablet by  mouth daily.  . naltrexone (DEPADE) 50 MG tablet Take 1 tablet (50 mg total) by mouth daily.  Marland Kitchen omeprazole (PRILOSEC) 20 MG capsule Take 1 capsule (20 mg total) by mouth daily.  . QUEtiapine (SEROQUEL) 300 MG tablet Take 1 tablet (300 mg total) by mouth at bedtime.  . thiamine 100 MG tablet Take 1 tablet (100 mg total) by mouth daily.   No facility-administered encounter medications on file as of 06/20/2019.     Allergies as of 06/20/2019 - Review Complete 06/20/2019  Allergen Reaction Noted  . Ciprofloxacin Other (See Comments) 05/26/2013  . Septra [sulfamethoxazole-trimethoprim] Other (See Comments) 11/14/2014  . Tylenol [acetaminophen] Other (See Comments) 06/16/2018    Past Medical History:  Diagnosis Date  . Alcohol addiction ([place])   . Anxiety   . Back pain 03/18/2016  . Bipolar 1 disorder ([place])   . Blood in stool 07/2017   internal hemorrhoid   . Chicken pox   . 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 Depression   . Fractured rib 2019  . Frequent headaches   . Frequent UTI   . GERD (gastroesophageal reflux disease)   . HPV in female   . Hypertension   . Hypomagnesemia 11/17/2017  . Migraine   . PCOS (polycystic ovarian syndrome)   . Prediabetes   . Vocal cord dysfunction     Past Surgical History:  Procedure  Laterality Date  . WISDOM TOOTH EXTRACTION      Family History  Problem Relation Age of Onset  . Diabetes Mother   . Stroke Mother 71  . Hypertension Mother   . Heart murmur Mother   . Alcohol abuse Mother   . Arthritis Mother   . Depression Mother   . Early death Mother   . Hyperlipidemia Mother   . Kidney disease Mother   . Mental illness Mother   . Epilepsy Mother   . Rheum arthritis Sister   . Heart murmur Sister   . Alcohol abuse Sister   . Bipolar disorder Sister   . Cirrhosis Sister   . Heart murmur Brother   . Alcohol abuse Brother   . Depression Brother   . Mental illness Brother        bipolar  . Alcohol abuse Father   . Depression  Father   . Mental illness Father   . Arthritis Maternal Grandmother   . Birth defects Maternal Grandmother   . Hyperlipidemia Maternal Grandmother   . Hyperlipidemia Maternal Grandfather   . Alcohol abuse Maternal Grandfather   . Arthritis Maternal Grandfather   . Diabetes Maternal Grandfather   . Stroke Maternal Grandfather   . Arthritis Paternal Grandmother   . Arthritis Paternal Grandfather   . Birth defects Brother   . Mental illness Brother        bipolar  . Depression Brother   . Mental illness Brother     Social History   Socioeconomic History  . Marital status: Married    Spouse name: Not on file  . Number of children: 1  . Years of education: Not on file  . Highest education level: Not on file  Occupational History  . Occupation: stay at home mom  Social Needs  . Financial resource strain: Not on file  . Food insecurity    Worry: Not on file    Inability: Not on file  . Transportation needs    Medical: Not on file    Non-medical: Not on file  Tobacco Use  . Smoking status: Current Some Day Smoker    Packs/day: 0.25    Years: 15.00    Pack years: 3.75    Types: Cigarettes  . Smokeless tobacco: Never Used  . Tobacco comment: refused  Substance and Sexual Activity  . Alcohol use: Yes    Comment: Binge drinker  . Drug use: No  . Sexual activity: Yes    Partners: Male    Birth control/protection: I.U.D.  Lifestyle  . Physical activity    Days per week: Not on file    Minutes per session: Not on file  . Stress: Not on file  Relationships  . Social Herbalist on phone: Not on file    Gets together: Not on file    Attends religious service: Not on file    Active member of club or organization: Not on file    Attends meetings of clubs or organizations: Not on file    Relationship status: Not on file  . Intimate partner violence    Fear of current or ex partner: Not on file    Emotionally abused: Not on file    Physically abused: Not on  file    Forced sexual activity: Not on file  Other Topics Concern  . Not on file  Social History Narrative   Married. 1 child.    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  Alcohol abuse. Smoker.   Smoke alarm in the home   feels safe in her relationships.       Review of systems: Review of Systems  Constitutional: Negative for fever and chills.  HENT: Positive for sinus trouble Eyes: Negative for blurred vision.  Respiratory: Negative for cough  and wheezing.  Positive for shortness of breath. Cardiovascular: Negative for chest pain and palpitations.  Gastrointestinal: as per HPI Genitourinary: Negative for dysuria, urgency, frequency and hematuria.  Musculoskeletal: Positive for myalgias, back pain and joint pain.  Skin: Negative for itching and rash.  Neurological: Negative for dizziness, tremors, focal weakness, seizures and loss of consciousness.  Endo/Heme/Allergies: Positive for seasonal allergies.  Psychiatric/Behavioral: Negative for suicidal ideas and hallucinations. Positive for sleeping problem. All other systems reviewed and are negative.   Physical Exam: Vitals:   06/20/19 1105  BP: 114/80  Pulse: 88  Temp: 98.6 F (37 C)   Body mass index is 49.41 kg/m. Gen:      No acute distress HEENT:  EOMI, sclera anicteric Neck:     No masses; no thyromegaly Lungs:    Clear to auscultation bilaterally; normal respiratory effort CV:         Regular rate and rhythm; no murmurs Abd:      + bowel sounds; soft, non-tender; no palpable masses, no distension Ext:    No edema; adequate peripheral perfusion Skin:      Warm and dry; no rash Neuro: alert and oriented x 3 Psych: normal mood and affect  Data Reviewed:  Reviewed labs, radiology imaging, old records and pertinent past GI work up   Assessment and Plan/Recommendations:  33 year old female with morbid obesity, depression and anxiety with history of alcohol abuse.  Abnormal LFT likely secondary to  steatohepatitis [metabolic + alcohol use]  Elevated INR and ammonia could be secondary to acute alcoholic hepatitis, she does not have any signs of cirrhosis or portal hypertension based on exam.  She does not have thrombocytopenia to suggest portal hypertension or splenomegaly.  Recheck CBC, CMP, PT and INR.  We will also check AFP for [facility] screening. Check ANA, antimitochondrial antibody, anti-smooth muscle antibody, alpha-1 antitrypsin level, ceruloplasmin and ferritin to exclude any other etiology for chronic liver disease.  Obtain abdominal ultrasound with elastography to determine degree of fibrosis  Discussed alcohol cessation, also diet and exercise in detail. Low-carb high-protein diet Goal weight loss 5% body weight next 3 months  Avoid hepatotoxins, herbal remedies and NSAIDs Okay to use up to 2 g Tylenol per day in divided doses as needed  Return in 3 months   Greater than 50% of the time used for counseling as well as treatment plan and follow-up. She had multiple questions which were answered to her satisfaction  K. Denzil Magnuson , MD    CC: Raoul Pitch, Renee A, DO

## 2019-06-20 NOTE — Patient Instructions (Addendum)
You have been scheduled for an abdominal ultrasound at [facility] (1st floor of hospital) on 06/27/2019 at 9am. Please arrive 15 minutes prior to your appointment for registration. Make certain not to have anything to eat or drink after midnight prior to your appointment. Should you need to reschedule your appointment, please contact radiology at [phone]. This test typically takes about 30 minutes to perform.  Go to the basement for labs today  No Alcohol  Start a LOW CARB Diet  High-Protein  Diet Eating high-protein and high-calorie foods can help you to gain weight, heal after an injury, and recover after an illness or surgery. The specific amount of daily protein and calories you need depends on:  Your body weight.  The reason this diet is recommended for you. What is my plan? Generally, a high-protein, high-calorie diet involves:  Eating 250-500 extra calories each day.  Making sure that you get enough of your daily calories from protein. Ask your health care provider how many of your calories should come from protein. Talk with a health care provider, such as a diet and nutrition specialist (dietitian), about how much protein and how many calories you need each day. Follow the diet as directed by your health care provider. What are tips for following this plan?  Preparing meals  Add whole milk, half-and-half, or heavy cream to cereal, pudding, soup, or hot cocoa.  Add whole milk to instant breakfast drinks.  Add peanut butter to oatmeal or smoothies.  Add powdered milk to baked goods, smoothies, or milkshakes.  Add powdered milk, cream, or butter to mashed potatoes.  Add cheese to cooked vegetables.  Make whole-milk yogurt parfaits. Top them with granola, fruit, or nuts.  Add cottage cheese to your fruit.  Add avocado, cheese, or both to sandwiches or salads.  Add meat, poultry, or seafood to rice, pasta, casseroles, salads, and soups.  Use mayonnaise  when making egg salad, chicken salad, or tuna salad.  Use peanut butter as a dip for vegetables or as a topping for pretzels, celery, or crackers.  Add beans to casseroles, dips, and spreads.  Add pureed beans to sauces and soups.  Replace calorie-free drinks with calorie-containing drinks, such as milk and fruit juice.  Replace water with milk or heavy cream when making foods such as oatmeal, pudding, or cocoa. General instructions  Ask your health care provider if you should take a nutritional supplement.  Try to eat six small meals each day instead of three large meals.  Eat a balanced diet. In each meal, include one food that is high in protein.  Keep nutritious snacks available, such as nuts, trail mixes, dried fruit, and yogurt.  If you have kidney disease or diabetes, talk with your health care provider about how much protein is safe for you. Too much protein may put extra stress on your kidneys.  Drink your calories. Choose high-calorie drinks and have them after your meals. What high-protein foods should I eat?  Vegetables Soybeans. Peas. Grains Quinoa. Bulgur wheat. Meats and other proteins Beef, pork, and poultry. Fish and seafood. Eggs. Tofu. Textured vegetable protein (TVP). Peanut butter. Nuts and seeds. Dried beans. Protein powders. Dairy Whole milk. Whole-milk yogurt. Powdered milk. Cheese. [employer]. Eggnog. Beverages High-protein supplement drinks. Soy milk. Other foods Protein bars. The items listed above may not be a complete list of high-protein foods and beverages. Contact a dietitian for more options. What high-calorie foods should I eat? Fruits Dried fruit. Fruit leather. Canned fruit  in syrup. Fruit juice. Avocado. Vegetables Vegetables cooked in oil or butter. Fried potatoes. Grains Pasta. Quick breads. Muffins. Pancakes. Ready-to-eat cereal. Meats and other proteins Peanut butter. Nuts and seeds. Dairy Heavy cream. Whipped cream. Cream  cheese. Sour cream. Ice cream. Custard. Pudding. Beverages Meal-replacement beverages. Nutrition shakes. Fruit juice. Sugar-sweetened soft drinks. Seasonings and condiments Salad dressing. Mayonnaise. Alfredo sauce. Fruit preserves or jelly. Honey. Syrup. Sweets and desserts Cake. Cookies. Pie. Pastries. Candy bars. Chocolate. Fats and oils Butter or margarine. Oil. Gravy. Other foods Meal-replacement bars. The items listed above may not be a complete list of high-calorie foods and beverages. Contact a dietitian for more options. Summary  A high-protein, high-calorie diet can help you gain weight or heal faster after an injury, illness, or surgery.  To increase your protein and calories, add ingredients such as whole milk, peanut butter, cheese, beans, meat, or seafood to meal items.  To get enough extra calories each day, include high-calorie foods and beverages at each meal.  Adding a high-calorie drink or shake can be an easy way to help you get enough calories each day. Talk with your healthcare provider or dietitian about the best options for you. This information is not intended to replace advice given to you by your health care provider. Make sure you discuss any questions you have with your health care provider. Document Released: 09/15/2005 Document Revised: 08/28/2017 Document Reviewed: 07/28/2017 Elsevier Patient Education  [employer].   I appreciate the  opportunity to care for you  Thank You   Harl Bowie , MD

## 2019-06-21 LAB — ANA: Anti Nuclear Antibody (ANA): NEGATIVE

## 2019-06-22 LAB — AFP TUMOR MARKER: AFP-Tumor Marker: 5.4 ng/mL

## 2019-06-22 LAB — CERULOPLASMIN: Ceruloplasmin: 32 mg/dL (ref 18–53)

## 2019-06-22 LAB — ALPHA-1-ANTITRYPSIN: A-1 Antitrypsin, Ser: 139 mg/dL (ref 83–199)

## 2019-06-22 LAB — MITOCHONDRIAL ANTIBODIES: Mitochondrial M2 Ab, IgG: <=20 U

## 2019-06-22 LAB — ANTI-SMOOTH MUSCLE ANTIBODY, IGG: Actin (Smooth Muscle) Antibody (IGG): <20 U (ref <20)

## 2019-06-27 ENCOUNTER — Other Ambulatory Visit: Payer: Self-pay

## 2019-06-27 ENCOUNTER — Ambulatory Visit (HOSPITAL_COMMUNITY)
Admission: RE | Admit: 2019-06-27 | Discharge: 2019-06-27 | Disposition: A | Discharge status: Home / Self Care | Payer: 59 | Source: Ambulatory Visit | Attending: Gastroenterology | Admitting: Gastroenterology

## 2019-06-27 DIAGNOSIS — K7581 Nonalcoholic steatohepatitis (NASH): Secondary | ICD-10-CM | POA: Diagnosis present

## 2019-07-04 ENCOUNTER — Ambulatory Visit (HOSPITAL_COMMUNITY): Payer: 59 | Admitting: Psychiatry

## 2019-07-06 ENCOUNTER — Telehealth: Payer: Self-pay | Admitting: Family Medicine

## 2019-07-06 NOTE — Telephone Encounter (Signed)
Pt was called, no answer, VM left asking to call nurse back

## 2019-07-06 NOTE — Telephone Encounter (Signed)
Patient left VM on front desk phone. Only gave name and phone number no other details. Called patient back & left a VM for her to call our office.

## 2019-07-07 NOTE — Telephone Encounter (Signed)
Pt was called and message was left to return call  

## 2019-07-11 ENCOUNTER — Ambulatory Visit (HOSPITAL_COMMUNITY): Payer: 59 | Admitting: Psychiatry

## 2019-08-16 ENCOUNTER — Telehealth: Payer: Self-pay

## 2019-08-16 NOTE — Telephone Encounter (Signed)
Called to the patient's phone number as listed. No answer. Left a message to call back.

## 2019-08-16 NOTE — Telephone Encounter (Signed)
-----   Message from Greggory Keen, LPN sent at 69/01/721  4:34 PM EDT -----  Call and schedule her 3 month follow up appointment with Dr Silverio Decamp   Diffuse hepatic steatosis, no evidence of advanced fibrosis or cirrhosis based on elastography.  We will continue to monitor and repeat ultrasound with elastography in 6 to 12 months.  Continue to abstain from alcohol. Follow low fat diet.Return for an office visit in December.

## 2019-08-22 NOTE — Telephone Encounter (Signed)
Letter mailed

## 2019-10-19 ENCOUNTER — Ambulatory Visit: Payer: 59 | Admitting: Family Medicine

## 2019-10-19 DIAGNOSIS — Z0289 Encounter for other administrative examinations: Secondary | ICD-10-CM

## 2019-10-20 ENCOUNTER — Other Ambulatory Visit: Payer: Self-pay

## 2019-10-20 ENCOUNTER — Ambulatory Visit (INDEPENDENT_AMBULATORY_CARE_PROVIDER_SITE_OTHER): Payer: 59 | Admitting: Family Medicine

## 2019-10-20 ENCOUNTER — Encounter: Payer: Self-pay | Admitting: Family Medicine

## 2019-10-20 VITALS — BP 125/86 | HR 128 | Temp 97.8°F | Resp 18 | Ht 63.0 in | Wt 293.1 lb

## 2019-10-20 DIAGNOSIS — R220 Localized swelling, mass and lump, head: Secondary | ICD-10-CM | POA: Diagnosis not present

## 2019-10-20 DIAGNOSIS — K120 Recurrent oral aphthae: Secondary | ICD-10-CM | POA: Insufficient documentation

## 2019-10-20 DIAGNOSIS — E519 Thiamine deficiency, unspecified: Secondary | ICD-10-CM

## 2019-10-20 DIAGNOSIS — K146 Glossodynia: Secondary | ICD-10-CM

## 2019-10-20 MED ORDER — CHLORHEXIDINE GLUCONATE 0.12 % MT SOLN: 15.0000 mL | Freq: Two times a day (BID) | OROMUCOSAL | 1 refills | Status: DC

## 2019-10-20 MED ORDER — NYSTATIN 100000 UNIT/ML MT SUSP: 5.0000 mL | Freq: Four times a day (QID) | OROMUCOSAL | 0 refills | Status: DC

## 2019-10-20 MED ORDER — THIAMINE HCL 100 MG PO TABS: 100.0000 mg | ORAL_TABLET | Freq: Every day | ORAL | 1 refills | Status: DC

## 2019-10-20 MED ORDER — OMEPRAZOLE 40 MG PO CPDR: 40.0000 mg | DELAYED_RELEASE_CAPSULE | Freq: Every day | ORAL | 3 refills | Status: DC

## 2019-10-20 NOTE — Patient Instructions (Signed)
Higher dose omeprazole 40 mg called in - start today.  Nystatin swish every 6 hours for 5 days - treats thrush peridex mouth rinse in the morning when you wake and after dinner or before bed.  Make sure to take your b vitamin/thaimine.  Follow up next week and we can recheck tongue when we talk about your back.   Oral Ulcers Oral ulcers are small sores inside the mouth or near the mouth. They may occur on or inside the lips, inside the cheeks, on the tongue, or anywhere else inside or near the mouth. They may be called canker sores or cold sores, which are two types of oral ulcers. Many oral ulcers are harmless and go away on their own. In some cases, oral ulcers may require medical care to determine the cause and proper treatment. What are the causes? Common causes of this condition include:  Infections caused by viruses, bacteria, or fungi.  Emotional stress.  Foods or chemicals that irritate the mouth.  Injury or physical irritation of the mouth.  Medicines.  Allergies.  Tobacco use. Less common causes include:  Skin disease.  A type of herpes virus infection (herpes simplexor herpes zoster).  Oral cancer. In some cases, the cause may not be known. What increases the risk? You are more likely to develop this condition if:  You wear dental braces, dentures, or retainers.  You have poor oral hygiene.  You have sensitive skin.  You have a condition that affects the entire body (systemic condition), such as an immune disorder. What are the signs or symptoms? The main symptom of this condition is having one or more oval-shaped or round ulcers that have red borders. Symptoms may vary depending on the cause. This includes:  Location of the ulcers. Ulcers may be found inside the mouth, on the gums, or on the insides of the lips or cheeks. They may also be found on the lips or on skin that is near the mouth, such as the cheeks or chin.  Pain. Ulcers can be painful and  uncomfortable, or they can be painless.  Appearance of the ulcers. They may look like red blisters and be filled with fluid, or they may be white or yellow patches.  Frequency of outbreaks. Ulcers may go away permanently after one outbreak, or they may come back (recur) often or rarely. How is this diagnosed? This condition is diagnosed with a physical exam. Your health care provider may ask you questions about your lifestyle and your medical history. You may have tests, including:  Blood tests.  Removal of a small number of cells from an ulcer to be examined under a microscope (biopsy). How is this treated? Treatment depends on the severity and cause of the condition. Oral ulcers often go away on their own in 1-2 weeks. Treatment may include medicines, such as:  Medicines to treat a viral infection (antivirals), a bacterial infection (antibiotics), or a fungal infection (antifungals).  Medicines to help control pain. This may include: ? Over-the-counter pain medicines. ? Gel, cream, or spray to numb the area (topical anesthetic) if you have severe pain. ? Other medicines to coat or numb your mouth. Follow these instructions at home: Medicines  Take or use over-the-counter and prescription medicines only as told by your health care provider.  If you were prescribed an antibiotic medicine, take it as told by your health care provider. Do not stop taking the antibiotic even if you start to feel better.  Do not use products  that contain benzocaine (including numbing gels) to treat teething or mouth pain in children who are younger than 2 years. These products may cause a rare but serious blood condition. Eating and drinking  Eat a balanced diet. Do not eat: ? Spicy foods. ? Citrus, such as oranges. ? Other foods and drinks that you think may cause or irritate your ulcers.  Drink enough fluid to keep your urine pale yellow.  Avoid drinking alcohol. Lifestyle   Practice good oral  hygiene: ? Gently brush your teeth with a soft toothbrush two times a day. ? Floss your teeth every day. ? Get regular dental cleanings and checkups.  Do not use any products that contain nicotine or tobacco, such as cigarettes and e-cigarettes. If you need help quitting, ask your health care provider. Managing pain  If directed, put ice on your face in the affected area to help reduce pain. ? Put ice in a plastic bag. ? Place a towel between your skin and the bag. ? Leave the ice on for 20 minutes, 2-3 times a day.  Avoid physical or chemical irritants that may have caused the ulcers or made them worse, such as mouthwashes that contain alcohol (ethanol). If you wear dental braces, dentures, or retainers, work with your health care provider to make sure these devices are fitted correctly.  If you were prescribed a prescription mouthwash to help reduce pain in your mouth, use it as told by your health care provider. General instructions  Rinse with a salt-water mixture 3-4 times a day or as told by your health care provider. To make a salt-water mixture, completely dissolve -1 tsp (3-6 g) of salt in 1 cup (237 mL) of warm water.  Keep all follow-up visits as told by your health care provider. This is important. Contact a health care provider if:  You have: ? Pain that gets worse or does not get better with medicine. ? Four or more ulcers at one time. ? A fever. ? New ulcers that look or feel different from other ulcers you have. ? Inflammation in one eye or both eyes. ? Ulcers that do not go away after 10 days.  You develop new symptoms in your mouth, such as: ? Bleeding or crusting around your lips or gums. ? Tooth pain. ? Difficulty swallowing.  You develop symptoms on your skin or genitals, such as: ? A rash or blisters. ? Burning or itching sensations.  Your ulcers begin or get worse after you start a new medicine. Get help right away if you have:  Difficulty  breathing.  Swelling in your face or neck.  Excessive bleeding from your mouth.  Severe pain. Summary  Oral ulcers may occur anywhere inside or near the mouth.  They can be caused by many things, such as infections, stress, injury or irritation, or tobacco use.  Oral ulcers can be painful or painless.  Treatment may include medicines to relieve pain or to treat an infection (if appropriate).  Most oral ulcers go away in 1-2 weeks. This information is not intended to replace advice given to you by your health care provider. Make sure you discuss any questions you have with your health care provider. Document Revised: 01/28/2018 Document Reviewed: 01/28/2018 Elsevier Patient Education  [place].

## 2019-10-20 NOTE — Progress Notes (Signed)
This visit occurred during the SARS-CoV-2 public health emergency.  Safety protocols were in place, including screening questions prior to the visit, additional usage of staff PPE, and extensive cleaning of exam room while observing appropriate contact time as indicated for disinfecting solutions.    Brandi Kirk , [birth year], 34 y.o., female MRN: [id number] Patient Care Team    Relationship Specialty Notifications Start End  [name], DO PCP - General Family Medicine  03/15/18   [name], MD Consulting Physician Obstetrics and Gynecology  03/15/18   Wonda Horner, MD Consulting Physician Gastroenterology  03/15/18     Chief Complaint  Patient presents with  . Oral Swelling    Pt has spot on tongue that is painful x3 days ago. Pt feels like she is always having to clear her throat. Denies SOB.      Subjective: Pt presents for an OV with complaints of tongue swelling and tongue lesion of 3 days duration.  Associated symptoms include pain.  He denies fever, chills, nausea or vomit.  She has no known recent illness.  She states she noticed a white patch on the left side of her tongue that was painful and she was concerned it might be cancer.  She also endorses visualizing white patchy spots on her tongue.  She states she has had thrush before and the white patchy spots appeared to be thrush.  She feels she has to clear her throat more frequently over the last few weeks.  She does not recall any trauma to the tongue/biting the tongue etc.  She does have a history of alcohol abuse and B1 deficiency.  She also has a history of reflux.  She is currently prescribed Protonix 20 mg daily.  No flowsheet data found.  Allergies  Allergen Reactions  . Ciprofloxacin Other (See Comments)    intolerance  . Septra [Sulfamethoxazole-Trimethoprim] Other (See Comments)    Thrush   . Tylenol [Acetaminophen] Other (See Comments)    Patient stated she can't take it due to liver   Social  History   Social History Narrative   Married. 1 child.    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    Alcohol abuse. Smoker.   Smoke alarm in the home   feels safe in her relationships.    Past Medical History:  Diagnosis Date  . Alcohol addiction ([place])   . Anxiety   . Back pain 03/18/2016  . Bipolar 1 disorder ([place])   . Blood in stool 07/2017   internal hemorrhoid   . Chicken pox   . 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 Depression   . Fractured rib 2019  . Frequent headaches   . Frequent UTI   . GERD (gastroesophageal reflux disease)   . HPV in female   . Hypertension   . Hypomagnesemia 11/17/2017  . Migraine   . PCOS (polycystic ovarian syndrome)   . Prediabetes   . Vocal cord dysfunction    Past Surgical History:  Procedure Laterality Date  . WISDOM TOOTH EXTRACTION     Family History  Problem Relation Age of Onset  . Diabetes Mother   . Stroke Mother 63  . Hypertension Mother   . Heart murmur Mother   . Alcohol abuse Mother   . Arthritis Mother   . Depression Mother   . Early death Mother   . Hyperlipidemia Mother   . Kidney disease Mother   . Mental illness Mother   . Epilepsy  Mother   . Rheum arthritis Sister   . Heart murmur Sister   . Alcohol abuse Sister   . Bipolar disorder Sister   . Cirrhosis Sister   . Heart murmur Brother   . Alcohol abuse Brother   . Depression Brother   . Mental illness Brother        bipolar  . Alcohol abuse Father   . Depression Father   . Mental illness Father   . Arthritis Maternal Grandmother   . Birth defects Maternal Grandmother   . Hyperlipidemia Maternal Grandmother   . Hyperlipidemia Maternal Grandfather   . Alcohol abuse Maternal Grandfather   . Arthritis Maternal Grandfather   . Diabetes Maternal Grandfather   . Stroke Maternal Grandfather   . Arthritis Paternal Grandmother   . Arthritis Paternal Grandfather   . Birth defects Brother   . Mental illness Brother        bipolar  . Depression Brother   .  Mental illness Brother    Allergies as of 10/20/2019      Reactions   Ciprofloxacin Other (See Comments)   intolerance   Septra [sulfamethoxazole-trimethoprim] Other (See Comments)   Thrush   Tylenol [acetaminophen] Other (See Comments)   Patient stated she can't take it due to liver      Medication List       Accurate as of October 20, 2019  9:37 AM. If you have any questions, ask your nurse or doctor.        DULoxetine 60 MG capsule Commonly known as: CYMBALTA Take 1 capsule (60 mg total) by mouth daily.   gabapentin 300 MG capsule Commonly known as: NEURONTIN Take 1 capsule (300 mg total) by mouth at bedtime.   levonorgestrel 20 MCG/24HR IUD Commonly known as: MIRENA 1 each by Intrauterine route continuous.   metoprolol succinate 50 MG 24 hr tablet Commonly known as: TOPROL-XL Take 1 tablet (50 mg total) by mouth daily. Take with or immediately following a meal.   multivitamin tablet Take 1 tablet by mouth daily.   naltrexone 50 MG tablet Commonly known as: DEPADE Take 1 tablet (50 mg total) by mouth daily.   omeprazole 20 MG capsule Commonly known as: PRILOSEC Take 1 capsule (20 mg total) by mouth daily.   QUEtiapine 300 MG tablet Commonly known as: SEROQUEL Take 1 tablet (300 mg total) by mouth at bedtime.   thiamine 100 MG tablet Take 1 tablet (100 mg total) by mouth daily.       All past medical history, surgical history, allergies, family history, immunizations andmedications were updated in the EMR today and reviewed under the history and medication portions of their EMR.     ROS: Negative, with the exception of above mentioned in HPI   Objective:  BP 125/86 (BP Location: Left Arm, Patient Position: Sitting, Cuff Size: Large)   Pulse (!) 128   Temp 97.8 F (36.6 C) (Temporal)   Resp 18   Ht 5' 3"  (1.6 m)   Wt 293 lb 2 oz (133 kg)   SpO2 96%   BMI 51.92 kg/m  Body mass index is 51.92 kg/m. Gen: Afebrile. No acute distress. Nontoxic in  appearance, well developed, well nourished. Obese caucasian female.  HENT: AT. [place]. MMM. No obvious thrush/white patchy areas of tongue. Pea sized tongue ulceration left lateral posterior tongue. No erythema. No drainage. No obvious tongue swelling. Throat without erythema or exudates. Clearing of throat present, no cough.  Eyes:Pupils Equal Round Reactive to light, Extraocular  movements intact,  Conjunctiva without redness, discharge or icterus. CV: tachycardic- has not taken her BB today  No exam data present No results found. No results found for this or any previous visit (from the past 24 hour(s)).  Assessment/Plan: Brandi Kirk is a 34 y.o. female present for OV for  Tongue pain/Mild tongue swelling/Aphthous ulcer of tongue/Vitamin B1 deficiency Patient has a pea-sized ulceration on the left lateral aspect of her tongue.  I did not appreciate any thrush-like appearance or tongue swelling. Encouraged her to take her beta-blocker as prescribed for her tachycardia. Encouraged her to take her vitamin supplements including B1 daily. Encouraged her strongly to stop drinking alcohol. Nystatin swish prescribed Peridex solution twice daily prescribed Omeprazole increased dose of 40 mg prescribed Follow-up in 1 week (patient also wanted to discuss back pain at that time)   Reviewed expectations re: course of current medical issues.  Discussed self-management of symptoms.  Outlined signs and symptoms indicating need for more acute intervention.  Patient verbalized understanding and all questions were answered.  Patient received an After-Visit Summary.    No orders of the defined types were placed in this encounter.  Meds ordered this encounter  Medications  . chlorhexidine (PERIDEX) 0.12 % solution    Sig: Use as directed 15 mLs in the mouth or throat 2 (two) times daily.    Dispense:  120 mL    Refill:  1  . omeprazole (PRILOSEC) 40 MG capsule    Sig: Take 1 capsule (40 mg total)  by mouth daily.    Dispense:  90 capsule    Refill:  3  . nystatin (MYCOSTATIN) 100000 UNIT/ML suspension    Sig: Take 5 mLs (500,000 Units total) by mouth 4 (four) times daily. 5 ml swish for 30 seconds and then spit out, QID, do not swallow.    Dispense:  100 mL    Refill:  0  . thiamine 100 MG tablet    Sig: Take 1 tablet (100 mg total) by mouth daily.    Dispense:  90 tablet    Refill:  1      Note is dictated utilizing voice recognition software. Although note has been proof read prior to signing, occasional typographical errors still can be missed. If any questions arise, please do not hesitate to call for verification.   electronically signed by:  Howard Pouch, DO  [place]

## 2019-10-25 ENCOUNTER — Encounter: Payer: Self-pay | Admitting: Family Medicine

## 2019-10-25 ENCOUNTER — Other Ambulatory Visit: Payer: Self-pay

## 2019-10-25 ENCOUNTER — Telehealth: Payer: Self-pay | Admitting: Family Medicine

## 2019-10-25 ENCOUNTER — Ambulatory Visit (INDEPENDENT_AMBULATORY_CARE_PROVIDER_SITE_OTHER): Payer: 59 | Admitting: Family Medicine

## 2019-10-25 VITALS — BP 123/85 | HR 89 | Temp 98.2°F | Resp 17 | Ht 63.0 in | Wt 294.4 lb

## 2019-10-25 DIAGNOSIS — M545 Low back pain, unspecified: Secondary | ICD-10-CM

## 2019-10-25 DIAGNOSIS — M62830 Muscle spasm of back: Secondary | ICD-10-CM | POA: Diagnosis not present

## 2019-10-25 DIAGNOSIS — K120 Recurrent oral aphthae: Secondary | ICD-10-CM

## 2019-10-25 DIAGNOSIS — E876 Hypokalemia: Secondary | ICD-10-CM

## 2019-10-25 LAB — BASIC METABOLIC PANEL WITH GFR
BUN: 5 mg/dL — ABNORMAL LOW (ref 6–23)
CO2: 27 meq/L (ref 19–32)
Calcium: 10.2 mg/dL (ref 8.4–10.5)
Chloride: 94 meq/L — ABNORMAL LOW (ref 96–112)
Creatinine, Ser: 0.6 mg/dL (ref 0.40–1.20)
GFR: 116.21 mL/min
Glucose, Bld: 135 mg/dL — ABNORMAL HIGH (ref 70–99)
Potassium: 3.3 meq/L — ABNORMAL LOW (ref 3.5–5.1)
Sodium: 139 meq/L (ref 135–145)

## 2019-10-25 LAB — TSH: TSH: 3.75 u[IU]/mL (ref 0.35–4.50)

## 2019-10-25 LAB — MAGNESIUM: Magnesium: 1.2 mg/dL — ABNORMAL LOW (ref 1.5–2.5)

## 2019-10-25 LAB — T4, FREE: Free T4: 0.7 ng/dL (ref 0.60–1.60)

## 2019-10-25 MED ORDER — POTASSIUM CHLORIDE CRYS ER 20 MEQ PO TBCR: EXTENDED_RELEASE_TABLET | ORAL | 0 refills | Status: DC

## 2019-10-25 MED ORDER — MAGNESIUM CHLORIDE 64 MG PO TBEC: DELAYED_RELEASE_TABLET | ORAL | 0 refills | Status: DC

## 2019-10-25 MED ORDER — BACLOFEN 10 MG PO TABS: 5.0000 mg | ORAL_TABLET | Freq: Three times a day (TID) | ORAL | 0 refills | Status: DC

## 2019-10-25 NOTE — Progress Notes (Signed)
This visit occurred during the SARS-CoV-2 public health emergency.  Safety protocols were in place, including screening questions prior to the visit, additional usage of staff PPE, and extensive cleaning of exam room while observing appropriate contact time as indicated for disinfecting solutions.    Brandi Kirk , Apr 28, 1986, 34 y.o., female MRN: [id number] Patient Care Team    Relationship Specialty Notifications Start End  [name], DO PCP - General Family Medicine  03/15/18   [name], MD Consulting Physician Obstetrics and Gynecology  03/15/18   Wonda Horner, MD Consulting Physician Gastroenterology  03/15/18     Chief Complaint  Patient presents with  . Follow-up    Pt states mouth feels better but back still hurts.   . Back Pain    Feels like it is "catching" and she cannot move      Subjective: Brandi Kirk is a 34 y.o. Pt presents for an OV with complaints of  Back pain:  Patient endorses having low back spasms.  He denies any injury prior to onset of symptoms.  She denies any radiation of pain.  Pain is across her lower lumbar spine.  She reports difficulty with many movements including side to side and even standing.  She states laying flat is very uncomfortable.  She reports this has been going on for 3 to 4 months.  If lifting a laundry basket her lower back will cramp.  Vacuuming causes cramping.  Has tried heating pad/ice and ibuprofen.  She is tried stretches and massage.  She reports the spasms will last about 3 to 5 minutes and stretches can help relieve.  Of significance patient does struggle with alcohol use.  She has a history of hypokalemia and hypomagnesia.  Tongue ulceration: Patient reports her tongue feels better.  She states the ulceration is still present but has much improved with the nystatin swish and Peridex solution.  She states her whole mouth feels more comfortable. Prior note:  tongue swelling and tongue lesion of 3 days duration.   Associated symptoms include pain.  He denies fever, chills, nausea or vomit.  She has no known recent illness.  She states she noticed a white patch on the left side of her tongue that was painful and she was concerned it might be cancer.  She also endorses visualizing white patchy spots on her tongue.  She states she has had thrush before and the white patchy spots appeared to be thrush.  She feels she has to clear her throat more frequently over the last few weeks.  She does not recall any trauma to the tongue/biting the tongue etc.  She does have a history of alcohol abuse and B1 deficiency.  She also has a history of reflux.  She is currently prescribed Protonix 20 mg daily.  No flowsheet data found.  Allergies  Allergen Reactions  . Ciprofloxacin Other (See Comments)    intolerance  . Septra [Sulfamethoxazole-Trimethoprim] Other (See Comments)    Thrush   . Tylenol [Acetaminophen] Other (See Comments)    Patient stated she can't take it due to liver   Social History   Social History Narrative   Married. 1 child.    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    Alcohol abuse. Smoker.   Smoke alarm in the home   feels safe in her relationships.    Past Medical History:  Diagnosis Date  . Alcohol addiction ([place])   . Anxiety   . Back pain  03/18/2016  . Bipolar 1 disorder ([place])   . Blood in stool 07/2017   internal hemorrhoid   . Chicken pox   . 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 Depression   . Fractured rib 2019  . Frequent headaches   . Frequent UTI   . GERD (gastroesophageal reflux disease)   . HPV in female   . Hypertension   . Hypomagnesemia 11/17/2017  . Migraine   . PCOS (polycystic ovarian syndrome)   . Prediabetes   . Vocal cord dysfunction    Past Surgical History:  Procedure Laterality Date  . WISDOM TOOTH EXTRACTION     Family History  Problem Relation Age of Onset  . Diabetes Mother   . Stroke Mother 86  . Hypertension Mother   . Heart murmur Mother   .  Alcohol abuse Mother   . Arthritis Mother   . Depression Mother   . Early death Mother   . Hyperlipidemia Mother   . Kidney disease Mother   . Mental illness Mother   . Epilepsy Mother   . Rheum arthritis Sister   . Heart murmur Sister   . Alcohol abuse Sister   . Bipolar disorder Sister   . Cirrhosis Sister   . Heart murmur Brother   . Alcohol abuse Brother   . Depression Brother   . Mental illness Brother        bipolar  . Alcohol abuse Father   . Depression Father   . Mental illness Father   . Arthritis Maternal Grandmother   . Birth defects Maternal Grandmother   . Hyperlipidemia Maternal Grandmother   . Hyperlipidemia Maternal Grandfather   . Alcohol abuse Maternal Grandfather   . Arthritis Maternal Grandfather   . Diabetes Maternal Grandfather   . Stroke Maternal Grandfather   . Arthritis Paternal Grandmother   . Arthritis Paternal Grandfather   . Birth defects Brother   . Mental illness Brother        bipolar  . Depression Brother   . Mental illness Brother    Allergies as of 10/25/2019      Reactions   Ciprofloxacin Other (See Comments)   intolerance   Septra [sulfamethoxazole-trimethoprim] Other (See Comments)   Thrush   Tylenol [acetaminophen] Other (See Comments)   Patient stated she can't take it due to liver      Medication List       Accurate as of October 25, 2019 11:14 AM. If you have any questions, ask your nurse or doctor.        chlorhexidine 0.12 % solution Commonly known as: PERIDEX Use as directed 15 mLs in the mouth or throat 2 (two) times daily.   DULoxetine 60 MG capsule Commonly known as: CYMBALTA Take 1 capsule (60 mg total) by mouth daily.   gabapentin 300 MG capsule Commonly known as: NEURONTIN Take 1 capsule (300 mg total) by mouth at bedtime.   levonorgestrel 20 MCG/24HR IUD Commonly known as: MIRENA 1 each by Intrauterine route continuous.   metoprolol succinate 50 MG 24 hr tablet Commonly known as: TOPROL-XL Take  1 tablet (50 mg total) by mouth daily. Take with or immediately following a meal.   multivitamin tablet Take 1 tablet by mouth daily.   naltrexone 50 MG tablet Commonly known as: DEPADE Take 1 tablet (50 mg total) by mouth daily.   nystatin 100000 UNIT/ML suspension Commonly known as: MYCOSTATIN Take 5 mLs (500,000 Units total) by mouth 4 (four) times daily. 5 ml swish  for 30 seconds and then spit out, QID, do not swallow.   omeprazole 40 MG capsule Commonly known as: PRILOSEC Take 1 capsule (40 mg total) by mouth daily.   QUEtiapine 300 MG tablet Commonly known as: SEROQUEL Take 1 tablet (300 mg total) by mouth at bedtime.   thiamine 100 MG tablet Take 1 tablet (100 mg total) by mouth daily.       All past medical history, surgical history, allergies, family history, immunizations andmedications were updated in the EMR today and reviewed under the history and medication portions of their EMR.     ROS: Negative, with the exception of above mentioned in HPI   Objective:  BP 123/85 (BP Location: Left Arm, Patient Position: Sitting, Cuff Size: Large)   Pulse 89   Temp 98.2 F (36.8 C) (Temporal)   Resp 17   Ht 5' 3"  (1.6 m)   Wt 294 lb 6 oz (133.5 kg)   SpO2 91%   BMI 52.15 kg/m  Body mass index is 52.15 kg/m. Gen: Afebrile. No acute distress.  HENT: AT. [place]. Bilateral TM visualized and normal in appearance. MMM.  No erythema, no exudates.  Ulceration left lateral side of the tongue improved-but still present. Eyes:Pupils Equal Round Reactive to light, Extraocular movements intact,  Conjunctiva without redness, discharge or icterus. Neck/lymp/endocrine: Supple, no lymphadenopathy, no thyromegaly CV: RRR no murmur, no edema Chest: CTAB, no wheeze or crackles MSK: No erythema, no soft tissue swelling lower lumbar spine.  Tender to palpation over lumbar paraspinal muscles.  Discomfort with right and left side bending and extension.  Neurovascularly intact distally. Skin:  No rashes, purpura or petechiae.  Neuro:  Normal gait. PERLA. EOMi. Alert. Oriented x3     No exam data present No results found. No results found for this or any previous visit (from the past 24 hour(s)).  Assessment/Plan: Brandi Kirk is a 34 y.o. female present for OV for  Aphthous ulcer of tongue Improved, but still present. Encouraged her to take her vitamin supplements including B1 daily. Encouraged her strongly to stop drinking alcohol. Continue Peridex solution twice daily prescribed Continue omeprazole increased dose of 40 mg prescribed If ulceration is not completely resolved in 2 weeks encouraged her to follow-up with her dentist.  Muscle spasm/lumbar pain/hypomagnesia: Patient seems to be having muscle spasms of her lower lumbar spine.  Suspect electrolyte disturbances reflecting increase spasms.  We will rule out thyroid as a potential cause as well today. BMP, magnesium, TSH and T4 collected today. -Electrolyte abnormalities will be supplemented to normal range after results received. -Baclofen 5 mg 3 times daily prescribed, may increase to 10 mg if needed. Follow-up dependent upon laboratory results.   Reviewed expectations re: course of current medical issues.  Discussed self-management of symptoms.  Outlined signs and symptoms indicating need for more acute intervention.  Patient verbalized understanding and all questions were answered.  Patient received an After-Visit Summary.    No orders of the defined types were placed in this encounter.  No orders of the defined types were placed in this encounter.     Note is dictated utilizing voice recognition software. Although note has been proof read prior to signing, occasional typographical errors still can be missed. If any questions arise, please do not hesitate to call for verification.   electronically signed by:  Howard Pouch, DO  [place]

## 2019-10-25 NOTE — Telephone Encounter (Signed)
Please inform patient the following information: Her magnesium is low at 1.2. I have called in magnesium for her to start by taking 2 tabs twice a day for 3 days, then 2 tabs QD. Her potassium is also low.  I have called in a potassium supplement for her to start  Her thyroid panel was normal.  Follow-up in 1 week for lab appointment only to recheck magnesium and potassium.  Orders placed

## 2019-10-25 NOTE — Patient Instructions (Signed)
Start baclofen 1/2 tab every 8 hours- we may increase after we get labs if needed.   Hydrate. With water and electrolytes- Ziko coconut water or G2 (gatorade).     Muscle Cramps and Spasms Muscle cramps and spasms are when muscles tighten by themselves. They usually get better within minutes. Muscle cramps are painful. They are usually stronger and last longer than muscle spasms. Muscle spasms may or may not be painful. They can last a few seconds or much longer. Cramps and spasms can affect any muscle, but they occur most often in the calf muscles of the leg. They are usually not caused by a serious problem. In many cases, the cause is not known. Some common causes include:  Doing more physical work or exercise than your body is ready for.  Using the muscles too much (overuse) by repeating certain movements too many times.  Staying in a certain position for a long time.  Playing a sport or doing an activity without preparing properly.  Using bad form or technique while playing a sport or doing an activity.  Not having enough water in your body (dehydration).  Injury.  Side effects of some medicines.  Low levels of the salts and minerals in your blood (electrolytes), such as low potassium or calcium. Follow these instructions at home: Managing pain and stiffness      Massage, stretch, and relax the muscle. Do this for many minutes at a time.  If told, put heat on tight or tense muscles as often as told by your doctor. Use the heat source that your doctor recommends, such as a moist heat pack or a heating pad. ? Place a towel between your skin and the heat source. ? Leave the heat on for 20-30 minutes. ? Remove the heat if your skin turns bright red. This is very important if you are not able to feel pain, heat, or cold. You may have a greater risk of getting burned.  If told, put ice on the affected area. This may help if you are sore or have pain after a cramp or  spasm. ? Put ice in a plastic bag. ? Place a towel between your skin and the bag. ? Leave the ice on for 20 minutes, 2-3 times a day.  Try taking hot showers or baths to help relax tight muscles. Eating and drinking  Drink enough fluid to keep your pee (urine) pale yellow.  Eat a healthy diet to help ensure that your muscles work well. This should include: ? Fruits and vegetables. ? Lean protein. ? Whole grains. ? Low-fat or nonfat dairy products. General instructions  If you are having cramps often, avoid intense exercise for several days.  Take over-the-counter and prescription medicines only as told by your doctor.  Watch for any changes in your symptoms.  Keep all follow-up visits as told by your doctor. This is important. Contact a doctor if:  Your cramps or spasms get worse or happen more often.  Your cramps or spasms do not get better with time. Summary  Muscle cramps and spasms are when muscles tighten by themselves. They usually get better within minutes.  Cramps and spasms occur most often in the calf muscles of the leg.  Massage, stretch, and relax the muscle. This may help the cramp or spasm go away.  Drink enough fluid to keep your pee (urine) pale yellow. This information is not intended to replace advice given to you by your health care provider. Make sure  you discuss any questions you have with your health care provider. Document Revised: 02/08/2018 Document Reviewed: 02/08/2018 Elsevier Patient Education  [place].

## 2019-10-26 LAB — HEPATIC FUNCTION PANEL
ALT: 12 (ref 7–35)
AST: 16 (ref 13–35)
Bilirubin, Total: 0.6

## 2019-10-26 LAB — CBC AND DIFFERENTIAL
HCT: 38 (ref 36–46)
Hemoglobin: 13.2 (ref 12.0–16.0)
Platelets: 282 (ref 150–399)
WBC: 9.7

## 2019-10-26 LAB — LIPID PANEL
Cholesterol: 266 — AB (ref 0–200)
HDL: 44 (ref 35–70)
LDL Cholesterol: 185
Triglycerides: 184 — AB (ref 40–160)

## 2019-10-26 LAB — BASIC METABOLIC PANEL
BUN: 13 (ref 4–21)
Creatinine: 0.7 (ref 0.5–1.1)
Potassium: 4.4 (ref 3.4–5.3)
Sodium: 135 — AB (ref 137–147)

## 2019-10-26 LAB — TSH: TSH: 0.72 (ref 0.41–5.90)

## 2019-10-26 LAB — CBC: RBC: 4.48 (ref 3.87–5.11)

## 2019-10-26 NOTE — Telephone Encounter (Signed)
Pt was called and given information. She was scheduled for a lab visit

## 2019-10-26 NOTE — Telephone Encounter (Signed)
Pt was called and message was left to return call  

## 2019-11-02 ENCOUNTER — Ambulatory Visit: Payer: 59

## 2019-11-04 ENCOUNTER — Other Ambulatory Visit: Payer: Self-pay

## 2019-11-04 ENCOUNTER — Ambulatory Visit (INDEPENDENT_AMBULATORY_CARE_PROVIDER_SITE_OTHER): Payer: 59 | Admitting: Family Medicine

## 2019-11-04 DIAGNOSIS — E876 Hypokalemia: Secondary | ICD-10-CM

## 2019-11-05 ENCOUNTER — Encounter: Payer: Self-pay | Admitting: Family Medicine

## 2019-11-05 LAB — BASIC METABOLIC PANEL
BUN: 7 mg/dL (ref 7–25)
CO2: 24 mmol/L (ref 20–32)
Calcium: 8.2 mg/dL — ABNORMAL LOW (ref 8.6–10.2)
Chloride: 97 mmol/L — ABNORMAL LOW (ref 98–110)
Creat: 0.67 mg/dL (ref 0.50–1.10)
Glucose, Bld: 159 mg/dL — ABNORMAL HIGH (ref 65–99)
Potassium: 4.1 mmol/L (ref 3.5–5.3)
Sodium: 137 mmol/L (ref 135–146)

## 2019-11-05 LAB — MAGNESIUM: Magnesium: 1.7 mg/dL (ref 1.5–2.5)

## 2019-11-07 ENCOUNTER — Other Ambulatory Visit: Payer: Self-pay | Admitting: Family Medicine

## 2019-11-07 MED ORDER — CHOLECALCIFEROL 25 MCG (1000 UT) PO TABS: 1000.0000 [IU] | ORAL_TABLET | Freq: Every day | ORAL | 3 refills | Status: DC

## 2019-11-07 MED ORDER — MAGNESIUM CHLORIDE 64 MG PO TBEC: 2.0000 | DELAYED_RELEASE_TABLET | Freq: Every day | ORAL | 3 refills | Status: DC

## 2019-11-10 ENCOUNTER — Encounter: Payer: Self-pay | Admitting: Family Medicine

## 2019-11-10 NOTE — Telephone Encounter (Signed)
Please asked patient how much the baclofen she is taking? If she is taking a full tab 3 times a day tell her to cut back to a half a tab 3 times a day.  If she is taking a half a tab 3 times a day, please have her discontinue medicine until we follow-up tomorrow. -Baclofen can cause some mild peripheral edema in about 3% population.  Usually associated with higher doses.  -In the meantime keep feet elevated above heart level, prop up on pillows etc. -Please schedule her for tomorrow   Also ask her if those black spots on her toes are freckles/moles?  If so, I will certainly make sure I evaluate her skin lesions tomorrow as well.

## 2019-11-10 NOTE — Telephone Encounter (Signed)
Pt sent other messages with pictures. Can patient wait until tomorrow to be seen or does she need to seek emergent treatment. Please advise. You have a 3:30 open tomorrow.

## 2019-11-10 NOTE — Telephone Encounter (Signed)
Pt was called and given information, she verbalized understanding. Pt was advised to go to ED if she experienced any of SOB or swelling got worse. She verbalized understanding. Appt was scheduled for tomorrow

## 2019-11-11 ENCOUNTER — Ambulatory Visit (INDEPENDENT_AMBULATORY_CARE_PROVIDER_SITE_OTHER): Payer: 59 | Admitting: Family Medicine

## 2019-11-11 ENCOUNTER — Other Ambulatory Visit: Payer: Self-pay

## 2019-11-11 ENCOUNTER — Encounter: Payer: Self-pay | Admitting: Family Medicine

## 2019-11-11 VITALS — BP 119/82 | HR 108 | Temp 98.0°F | Resp 19 | Ht 63.0 in | Wt 310.2 lb

## 2019-11-11 DIAGNOSIS — K7581 Nonalcoholic steatohepatitis (NASH): Secondary | ICD-10-CM | POA: Diagnosis not present

## 2019-11-11 DIAGNOSIS — K140 Glossitis: Secondary | ICD-10-CM | POA: Insufficient documentation

## 2019-11-11 DIAGNOSIS — R600 Localized edema: Secondary | ICD-10-CM | POA: Diagnosis not present

## 2019-11-11 DIAGNOSIS — F1010 Alcohol abuse, uncomplicated: Secondary | ICD-10-CM | POA: Diagnosis not present

## 2019-11-11 HISTORY — DX: Localized edema: R60.0

## 2019-11-11 MED ORDER — FOLIC ACID 1 MG PO TABS: 1.0000 mg | ORAL_TABLET | Freq: Every day | ORAL | 1 refills | Status: DC

## 2019-11-11 MED ORDER — FUROSEMIDE 40 MG PO TABS: ORAL_TABLET | ORAL | 0 refills | Status: DC

## 2019-11-11 NOTE — Progress Notes (Signed)
This visit occurred during the SARS-CoV-2 public health emergency.  Safety protocols were in place, including screening questions prior to the visit, additional usage of staff PPE, and extensive cleaning of exam room while observing appropriate contact time as indicated for disinfecting solutions.    Brandi Kirk , Dec 12, 1985, 34 y.o., female MRN: [id number] Patient Care Team    Relationship Specialty Notifications Start End  [name], DO PCP - General Family Medicine  03/15/18   [name], MD Consulting Physician Obstetrics and Gynecology  03/15/18   Wonda Horner, MD Consulting Physician Gastroenterology  03/15/18     Chief Complaint  Patient presents with  . Leg Swelling    bilateral Leg edema x2 days. [place] still swollen and unable to eat.      Subjective: Pt presents for an OV with complaints of bilateral lower extremity edema of 2-3 days.  Patient suffers from alcohol addiction.  She has had chronically elevated liver enzymes and has followed with gastroenterology last September to start evaluation on her liver disease.  Patient reports she "went on a very bad bender "just prior to onset of her lower extremity edema swelling.  She denies fever, chills, nausea, vomit, cough or shortness of breath.  She reports she is taking her vitamin supplementations B complex, added thiamine, iron, calcium and vitamin D, potassium and magnesium.  She reports she has been trying to keep her legs elevated, but it is not helping much.  She states her legs feel extremely tight all the way up to her thighs and are painful. She reports she and her husband have sat down to talk about her addiction over the last 2 days.  They are looking into rehab admission for her. Patient has been prescribed naltrexone, gabapentin, Seroquel and Cymbalta-which she reports compliance to.  However, naltrexone has not seemed to work well for her-if at all.  Liver clinic referral was placed 06/05/2019-patient did  not establish. GI referral 02/2018-last seen 05/2000/2020  No flowsheet data found.  Allergies  Allergen Reactions  . Ciprofloxacin Other (See Comments)    intolerance  . Septra [Sulfamethoxazole-Trimethoprim] Other (See Comments)    Thrush   . Tylenol [Acetaminophen] Other (See Comments)    Patient stated she can't take it due to liver   Social History   Social History Narrative   Married. 1 child.    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    Alcohol abuse. Smoker.   Smoke alarm in the home   feels safe in her relationships.    Past Medical History:  Diagnosis Date  . Alcohol addiction ([place])   . Anxiety   . Back pain 03/18/2016  . Bipolar 1 disorder ([place])   . Blood in stool 07/2017   internal hemorrhoid   . Chicken pox   . 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 Depression   . Fractured rib 2019  . Frequent headaches   . Frequent UTI   . GERD (gastroesophageal reflux disease)   . HPV in female   . Hypertension   . Hypomagnesemia 11/17/2017  . Migraine   . PCOS (polycystic ovarian syndrome)   . Prediabetes   . Vocal cord dysfunction    Past Surgical History:  Procedure Laterality Date  . WISDOM TOOTH EXTRACTION     Family History  Problem Relation Age of Onset  . Diabetes Mother   . Stroke Mother 74  . Hypertension Mother   . Heart murmur Mother   .  Alcohol abuse Mother   . Arthritis Mother   . Depression Mother   . Early death Mother   . Hyperlipidemia Mother   . Kidney disease Mother   . Mental illness Mother   . Epilepsy Mother   . Rheum arthritis Sister   . Heart murmur Sister   . Alcohol abuse Sister   . Bipolar disorder Sister   . Cirrhosis Sister   . Heart murmur Brother   . Alcohol abuse Brother   . Depression Brother   . Mental illness Brother        bipolar  . Alcohol abuse Father   . Depression Father   . Mental illness Father   . Arthritis Maternal Grandmother   . Birth defects Maternal Grandmother   . Hyperlipidemia Maternal  Grandmother   . Hyperlipidemia Maternal Grandfather   . Alcohol abuse Maternal Grandfather   . Arthritis Maternal Grandfather   . Diabetes Maternal Grandfather   . Stroke Maternal Grandfather   . Arthritis Paternal Grandmother   . Arthritis Paternal Grandfather   . Birth defects Brother   . Mental illness Brother        bipolar  . Depression Brother   . Mental illness Brother    Allergies as of 11/11/2019      Reactions   Ciprofloxacin Other (See Comments)   intolerance   Septra [sulfamethoxazole-trimethoprim] Other (See Comments)   Thrush   Tylenol [acetaminophen] Other (See Comments)   Patient stated she can't take it due to liver      Medication List       Accurate as of November 11, 2019  6:48 PM. If you have any questions, ask your nurse or doctor.        STOP taking these medications   baclofen 10 MG tablet Commonly known as: LIORESAL Stopped by: Howard Pouch, DO     TAKE these medications   Cholecalciferol 25 MCG (1000 UT) tablet Take 1 tablet (1,000 Units total) by mouth daily.   DULoxetine 60 MG capsule Commonly known as: CYMBALTA Take 1 capsule (60 mg total) by mouth daily.   folic acid 1 MG tablet Commonly known as: FOLVITE Take 1 tablet (1 mg total) by mouth daily. Started by: Howard Pouch, DO   furosemide 40 MG tablet Commonly known as: LASIX 1 tab twice a day for 1 day, then 1 tab QD Started by: Howard Pouch, DO   gabapentin 300 MG capsule Commonly known as: NEURONTIN Take 1 capsule (300 mg total) by mouth at bedtime.   levonorgestrel 20 MCG/24HR IUD Commonly known as: MIRENA 1 each by Intrauterine route continuous.   magnesium chloride 64 MG Tbec SR tablet Commonly known as: SLOW-MAG Take 2 tablets (128 mg total) by mouth daily.   metoprolol succinate 50 MG 24 hr tablet Commonly known as: TOPROL-XL Take 1 tablet (50 mg total) by mouth daily. Take with or immediately following a meal.   multivitamin tablet Take 1 tablet by mouth  daily.   naltrexone 50 MG tablet Commonly known as: DEPADE Take 1 tablet (50 mg total) by mouth daily.   omeprazole 40 MG capsule Commonly known as: PRILOSEC Take 1 capsule (40 mg total) by mouth daily.   QUEtiapine 300 MG tablet Commonly known as: SEROQUEL Take 1 tablet (300 mg total) by mouth at bedtime.   thiamine 100 MG tablet Take 1 tablet (100 mg total) by mouth daily.       All past medical history, surgical history, allergies, family history, immunizations  andmedications were updated in the EMR today and reviewed under the history and medication portions of their EMR.     ROS: Negative, with the exception of above mentioned in HPI   Objective:  BP 119/82 (BP Location: Left Arm, Patient Position: Sitting, Cuff Size: Large)   Pulse (!) 108   Temp 98 F (36.7 C) (Temporal)   Resp 19   Ht 5' 3"  (1.6 m)   Wt (!) 310 lb 4 oz (140.7 kg)   SpO2 92%   BMI 54.96 kg/m  Body mass index is 54.96 kg/m. Gen: Afebrile. No acute distress. Nontoxic in appearance, well developed, well nourished.  Morbidly obese female. HENT: AT. [place]. MMM, ulceration of left lateral tongue.  Mild swelling of tongue notable on exam today. Eyes:Pupils Equal Round Reactive to light, Extraocular movements intact,  Conjunctiva without redness, discharge or icterus. Neck/lymp/endocrine: Supple, no lymphadenopathy CV: RRR, + 3 pitting edema bilateral lower extremities-feet, ankles, shins Chest: CTAB, no wheeze or crackles. Good air movement, normal resp effort.  Abd: Soft.  Morbidly obese.  Bowel sounds present.  No obvious masses.  Difficult exam due to body habitus.  No rebound or guarding.   Skin: No rashes, purpura or petechiae.  Neuro:  Normal gait. PERLA. EOMi. Alert. Oriented x3  Psych: Normal affect, dress and demeanor. Normal speech. Normal thought content and judgment.  No exam data present No results found. No results found for this or any previous visit (from the past 24  hour(s)).  Assessment/Plan: Madalynn Pickelsimer is a 34 y.o. female present for OV for  NASH (nonalcoholic steatohepatitis)/Bilateral lower extremity edema/Alcohol abuse -Lengthy, frank conversation with Wynnie this afternoon.  She does not lack insight into her addiction.  She wants to do what it is best for her and her family and stop drinking.  She reports she just cannot get through her day without alcohol to help or without having immediate symptoms of withdrawal.  I strongly feel she should have a hospital admission for medically supervised alcohol detox and then direct admission into a rehab facility.  She understands the health risks associated with attempting to detox on her own at home> with most severe of those being seizure or death -We will collect labs today and start diuretics.  She is significantly fluid overloaded today and her O2 saturation is 92%.  She has had a 16 pound fluid weight gain in 10 days.  She understands this is all associated to her alcohol use. -Start Lasix at half a tab as soon as she picks up his prescription this afternoon.  And then start Lasix 40 mg twice daily tomorrow, then Lasix 40 mg daily daily.  -Avoidance of alcohol was recommended - Comp Met (CMET) - B Nat Peptide - CBC w/Diff - Protime-INR ( [facility] ONLY) - Ammonia -Encouraged her to follow-up with gastroenterologist.  She is due for their 73-monthfollow-up Discussed need for very close follow-up either Monday or Tuesday of next week here, if she has not decided to admit herself into medically supervised alcohol detox.  She is aware to report to the emergency room if she has any worsening symptoms or shortness of breath.  Glossitis Continue B complex/thiamine and added folic acid prescribed to ensure she is taking adequate supplementation. She understands this is caused by her alcoholism.   Reviewed expectations re: course of current medical issues.  Discussed self-management of symptoms.  Outlined  signs and symptoms indicating need for more acute intervention.  Patient verbalized understanding and all questions  were answered.  Patient received an After-Visit Summary.    Orders Placed This Encounter  Procedures  . Comp Met (CMET)  . B Nat Peptide  . CBC w/Diff  . Protime-INR ( [facility] ONLY)  . Ammonia   Meds ordered this encounter  Medications  . furosemide (LASIX) 40 MG tablet    Sig: 1 tab twice a day for 1 day, then 1 tab QD    Dispense:  30 tablet    Refill:  0  . folic acid (FOLVITE) 1 MG tablet    Sig: Take 1 tablet (1 mg total) by mouth daily.    Dispense:  90 tablet    Refill:  1   Referral Orders  No referral(s) requested today    Greater than 40 minutes was spent with patient today counseling.  Note is dictated utilizing voice recognition software. Although note has been proof read prior to signing, occasional typographical errors still can be missed. If any questions arise, please do not hesitate to call for verification.   electronically signed by:  Howard Pouch, DO  [place]

## 2019-11-11 NOTE — Patient Instructions (Addendum)
Lasix 1/2 tab tonight- then 1 tab twice tomorrow, then once daily for 5 days.   Alcohol Use Disorder Alcohol use disorder is when your drinking disrupts your daily life. When you have this condition, you drink too much alcohol and you cannot control your drinking. Alcohol use disorder can cause serious problems with your physical health. It can affect your brain, heart, liver, pancreas, immune system, stomach, and intestines. Alcohol use disorder can increase your risk for certain cancers and cause problems with your mental health, such as depression, anxiety, psychosis, delirium, and dementia. People with this disorder risk hurting themselves and others. What are the causes? This condition is caused by drinking too much alcohol over time. It is not caused by drinking too much alcohol only one or two times. Some people with this condition drink alcohol to cope with or escape from negative life events. Others drink to relieve pain or symptoms of mental illness. What increases the risk? You are more likely to develop this condition if:  You have a family history of alcohol use disorder.  Your culture encourages drinking to the point of intoxication, or makes alcohol easy to get.  You had a mood or conduct disorder in childhood.  You have been a victim of abuse.  You are an adolescent and: ? You have poor grades or difficulties in school. ? Your caregivers do not talk to you about saying no to alcohol, or supervise your activities. ? You are impulsive or you have trouble with self-control. What are the signs or symptoms? Symptoms of this condition include:  Drinkingmore than you want to.  Drinking for longer than you want to.  Trying several times to drink less or to control your drinking.  Spending a lot of time getting alcohol, drinking, or recovering from drinking.  Craving alcohol.  Having problems at work, at school, or at home due to drinking.  Having problems in  relationships due to drinking.  Drinking when it is dangerous to drink, such as before driving a car.  Continuing to drink even though you know you might have a physical or mental problem related to drinking.  Needing more and more alcohol to get the same effect you want from the alcohol (building up tolerance).  Having symptoms of withdrawal when you stop drinking. Symptoms of withdrawal include: ? Fatigue. ? Nightmares. ? Trouble sleeping. ? Depression. ? Anxiety. ? Fever. ? Seizures. ? Severe confusion. ? Feeling or seeing things that are not there (hallucinations). ? Tremors. ? Rapid heart rate. ? Rapid breathing. ? High blood pressure.  Drinking to avoid symptoms of withdrawal. How is this diagnosed? This condition is diagnosed with an assessment. Your health care provider may start the assessment by asking three or four questions about your drinking. Your health care provider may perform a physical exam or do lab tests to see if you have physical problems resulting from alcohol use. She or he may refer you to a mental health professional for evaluation. How is this treated? Some people with alcohol use disorder are able to reduce their alcohol use to low-risk levels. Others need to completely quit drinking alcohol. When necessary, mental health professionals with specialized training in substance use treatment can help. Your health care provider can help you decide how severe your alcohol use disorder is and what type of treatment you need. The following forms of treatment are available:  Detoxification. Detoxification involves quitting drinking and using prescription medicines within the first week to help lessen withdrawal  symptoms. This treatment is important for people who have had withdrawal symptoms before and for heavy drinkers who are likely to have withdrawal symptoms. Alcohol withdrawal can be dangerous, and in severe cases, it can cause death. Detoxification may be  provided in a home, community, or primary care setting, or in a hospital or substance use treatment facility.  Counseling. This treatment is also called talk therapy. It is provided by substance use treatment counselors. A counselor can address the reasons you use alcohol and suggest ways to keep you from drinking again or to prevent problem drinking. The goals of talk therapy are to: ? Find healthy activities and ways for you to cope with stress. ? Identify and avoid the things that trigger your alcohol use. ? Help you learn how to handle cravings.  Medicines.Medicines can help treat alcohol use disorder by: ? Decreasing alcohol cravings. ? Decreasing the positive feeling you have when you drink alcohol. ? Causing an uncomfortable physical reaction when you drink alcohol (aversion therapy).  Support groups. Support groups are led by people who have quit drinking. They provide emotional support, advice, and guidance. These forms of treatment are often combined. Some people with this condition benefit from a combination of treatments provided by specialized substance use treatment centers. Follow these instructions at home:  Take over-the-counter and prescription medicines only as told by your health care provider.  Check with your health care provider before starting any new medicines.  Ask friends and family members not to offer you alcohol.  Avoid situations where alcohol is served, including gatherings where others are drinking alcohol.  Create a plan for what to do when you are tempted to use alcohol.  Find hobbies or activities that you enjoy that do not include alcohol.  Keep all follow-up visits as told by your health care provider. This is important. How is this prevented?  If you drink, limit alcohol intake to no more than 1 drink a day for nonpregnant women and 2 drinks a day for men. One drink equals 12 oz of beer, 5 oz of wine, or 1 oz of hard liquor.  If you have a  mental health condition, get treatment and support.  Do not give alcohol to adolescents.  If you are an adolescent: ? Do not drink alcohol. ? Do not be afraid to say no if someone offers you alcohol. Speak up about why you do not want to drink. You can be a positive role model for your friends and set a good example for those around you by not drinking alcohol. ? If your friends drink, spend time with others who do not drink alcohol. Make new friends who do not use alcohol. ? Find healthy ways to manage stress and emotions, such as meditation or deep breathing, exercise, spending time in nature, listening to music, or talking with a trusted friend or family member. Contact a health care provider if:  You are not able to take your medicines as told.  Your symptoms get worse.  You return to drinking alcohol (relapse) and your symptoms get worse. Get help right away if:  You have thoughts about hurting yourself or others. If you ever feel like you may hurt yourself or others, or have thoughts about taking your own life, get help right away. You can go to your nearest emergency department or call:  Your local emergency services (911 in the [place]  A suicide crisis helpline, such as the [facility] at (712) 060-9303. This is  open 24 hours a day. Summary  Alcohol use disorder is when your drinking disrupts your daily life. When you have this condition, you drink too much alcohol and you cannot control your drinking.  Treatment may include detoxification, counseling, medicine, and support groups.  Ask friends and family members not to offer you alcohol. Avoid situations where alcohol is served.  Get help right away if you have thoughts about hurting yourself or others. This information is not intended to replace advice given to you by your health care provider. Make sure you discuss any questions you have with your health care provider. Document Revised: 08/28/2017  Document Reviewed: 06/12/2016 Elsevier Patient Education  [place].

## 2019-11-12 LAB — COMPREHENSIVE METABOLIC PANEL
AG Ratio: 1.4 (calc) (ref 1.0–2.5)
ALT: 212 U/L — ABNORMAL HIGH (ref 6–29)
AST: 623 U/L — ABNORMAL HIGH (ref 10–30)
Albumin: 4 g/dL (ref 3.6–5.1)
Alkaline phosphatase (APISO): 197 U/L — ABNORMAL HIGH (ref 31–125)
BUN: 8 mg/dL (ref 7–25)
CO2: 24 mmol/L (ref 20–32)
Calcium: 9.1 mg/dL (ref 8.6–10.2)
Chloride: 98 mmol/L (ref 98–110)
Creat: 0.52 mg/dL (ref 0.50–1.10)
Globulin: 2.8 g/dL (calc) (ref 1.9–3.7)
Glucose, Bld: 113 mg/dL — ABNORMAL HIGH (ref 65–99)
Potassium: 4.2 mmol/L (ref 3.5–5.3)
Sodium: 139 mmol/L (ref 135–146)
Total Bilirubin: 1.9 mg/dL — ABNORMAL HIGH (ref 0.2–1.2)
Total Protein: 6.8 g/dL (ref 6.1–8.1)

## 2019-11-12 LAB — CBC WITH DIFFERENTIAL/PLATELET
Absolute Monocytes: 484 cells/uL (ref 200–950)
Basophils Absolute: 19 cells/uL (ref 0–200)
Basophils Relative: 0.6 %
Eosinophils Absolute: 99 cells/uL (ref 15–500)
Eosinophils Relative: 3.2 %
HCT: 35.7 % (ref 35.0–45.0)
Hemoglobin: 13 g/dL (ref 11.7–15.5)
Lymphs Abs: 1107 cells/uL (ref 850–3900)
MCH: 35 pg — ABNORMAL HIGH (ref 27.0–33.0)
MCHC: 36.4 g/dL — ABNORMAL HIGH (ref 32.0–36.0)
MCV: 96.2 fL (ref 80.0–100.0)
MPV: 9.8 fL (ref 7.5–12.5)
Monocytes Relative: 15.6 %
Neutro Abs: 1392 cells/uL — ABNORMAL LOW (ref 1500–7800)
Neutrophils Relative %: 44.9 %
Platelets: 134 10*3/uL — ABNORMAL LOW (ref 140–400)
RBC: 3.71 10*6/uL — ABNORMAL LOW (ref 3.80–5.10)
RDW: 15.8 % — ABNORMAL HIGH (ref 11.0–15.0)
Total Lymphocyte: 35.7 %
WBC: 3.1 10*3/uL — ABNORMAL LOW (ref 3.8–10.8)

## 2019-11-12 LAB — BRAIN NATRIURETIC PEPTIDE: Brain Natriuretic Peptide: 34 pg/mL (ref <100)

## 2019-11-12 LAB — PROTIME-INR
INR: 1.1
Prothrombin Time: 11.2 s (ref 9.0–11.5)

## 2019-11-12 LAB — AMMONIA: Ammonia: 287 umol/L — ABNORMAL HIGH (ref <=72)

## 2019-11-15 ENCOUNTER — Other Ambulatory Visit: Payer: Self-pay

## 2019-11-15 ENCOUNTER — Encounter: Payer: Self-pay | Admitting: Family Medicine

## 2019-11-15 ENCOUNTER — Telehealth: Payer: Self-pay | Admitting: Family Medicine

## 2019-11-15 ENCOUNTER — Ambulatory Visit (INDEPENDENT_AMBULATORY_CARE_PROVIDER_SITE_OTHER): Payer: 59 | Admitting: Family Medicine

## 2019-11-15 VITALS — BP 115/80 | HR 100 | Temp 97.9°F | Resp 18 | Ht 63.0 in | Wt 291.5 lb

## 2019-11-15 DIAGNOSIS — E519 Thiamine deficiency, unspecified: Secondary | ICD-10-CM

## 2019-11-15 DIAGNOSIS — R0602 Shortness of breath: Secondary | ICD-10-CM

## 2019-11-15 DIAGNOSIS — F10929 Alcohol use, unspecified with intoxication, unspecified: Secondary | ICD-10-CM

## 2019-11-15 DIAGNOSIS — F1010 Alcohol abuse, uncomplicated: Secondary | ICD-10-CM

## 2019-11-15 DIAGNOSIS — K140 Glossitis: Secondary | ICD-10-CM

## 2019-11-15 DIAGNOSIS — E538 Deficiency of other specified B group vitamins: Secondary | ICD-10-CM | POA: Insufficient documentation

## 2019-11-15 DIAGNOSIS — K7581 Nonalcoholic steatohepatitis (NASH): Secondary | ICD-10-CM

## 2019-11-15 DIAGNOSIS — R600 Localized edema: Secondary | ICD-10-CM | POA: Insufficient documentation

## 2019-11-15 DIAGNOSIS — R7989 Other specified abnormal findings of blood chemistry: Secondary | ICD-10-CM

## 2019-11-15 DIAGNOSIS — K7030 Alcoholic cirrhosis of liver without ascites: Secondary | ICD-10-CM | POA: Diagnosis not present

## 2019-11-15 HISTORY — DX: Alcohol use, unspecified with intoxication, unspecified: F10.929

## 2019-11-15 NOTE — Progress Notes (Signed)
This visit occurred during the SARS-CoV-2 public health emergency.  Safety protocols were in place, including screening questions prior to the visit, additional usage of staff PPE, and extensive cleaning of exam room while observing appropriate contact time as indicated for disinfecting solutions.    Brandi Kirk , 1986/05/13, 34 y.o., female MRN: [id number] Patient Care Team    Relationship Specialty Notifications Start End  [name], DO PCP - General Family Medicine  03/15/18   [name], MD Consulting Physician Obstetrics and Gynecology  03/15/18   Wonda Horner, MD Consulting Physician Gastroenterology  03/15/18     Chief Complaint  Patient presents with  . Follow-up    Pt states her legs feel a lot better. Pt is drinking 8 drinks daily, had some this AM but not this afternoon.      Subjective: Brandi Kirk is a 34 y.o. female present for on her severely elevated liver enzymes secondary to alcohol use and bilateral lower extremity edema.  Her husband Brandi Kirk does accompany her today she gives permission to speak freely in front of him.  She was seen 5 days ago and started on Lasix for her edema.  She reports her edema is much improved.  Liver enzymes were found to be AST 623 and an ALT of 212, ammonia of 287, total bilirubin 1.9.  PT/INR normal range.  Was strongly encouraged to report to the emergency room and enter medically supervised alcohol detox.  Patient did not report to the emergency room.  She presents today intoxicated-is the first time she has done so.  Dates she had 8 drinks this morning.  She reports she drank this morning because she was going through withdrawal and felt sick.  After she drank she felt better. Also did start the folate and reports that her tongue swelling has decreased as well. Prior note: Pt presents for an OV with complaints of bilateral lower extremity edema of 2-3 days.  Patient suffers from alcohol addiction.  She has had chronically  elevated liver enzymes and has followed with gastroenterology last September to start evaluation on her liver disease.  Patient reports she "went on a very bad bender "just prior to onset of her lower extremity edema swelling.  She denies fever, chills, nausea, vomit, cough or shortness of breath.  She reports she is taking her vitamin supplementations B complex, added thiamine, iron, calcium and vitamin D, potassium and magnesium.  She reports she has been trying to keep her legs elevated, but it is not helping much.  She states her legs feel extremely tight all the way up to her thighs and are painful. She reports she and her husband have sat down to talk about her addiction over the last 2 days.  They are looking into rehab admission for her. Patient has been prescribed naltrexone, gabapentin, Seroquel and Cymbalta-which she reports compliance to.  However, naltrexone has not seemed to work well for her-if at all.  Liver clinic referral was placed 06/05/2019-patient did not establish. GI referral 02/2018-last seen 06/20/2019 .  Overdue for follow-up.  No flowsheet data found.  Allergies  Allergen Reactions  . Ciprofloxacin Other (See Comments)    intolerance  . Septra [Sulfamethoxazole-Trimethoprim] Other (See Comments)    Thrush   . Tylenol [Acetaminophen] Other (See Comments)    Patient stated she can't take it due to liver   Social History   Social History Narrative   Married. 1 child.    High school graduate. Has  a temp job.    Alcohol abuse. Smoker.   Smoke alarm in the home   feels safe in her relationships.    Past Medical History:  Diagnosis Date  . Alcohol addiction ([place])   . Anxiety   . Back pain 03/18/2016  . Bipolar 1 disorder ([place])   . Blood in stool 07/2017   internal hemorrhoid   . Chicken pox   . 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 Depression   . Fractured rib 2019  . Frequent headaches   . Frequent UTI   . GERD (gastroesophageal reflux disease)   . HPV in  female   . Hypertension   . Hypomagnesemia 11/17/2017  . Migraine   . PCOS (polycystic ovarian syndrome)   . Prediabetes   . Vocal cord dysfunction    Past Surgical History:  Procedure Laterality Date  . WISDOM TOOTH EXTRACTION     Family History  Problem Relation Age of Onset  . Diabetes Mother   . Stroke Mother 44  . Hypertension Mother   . Heart murmur Mother   . Alcohol abuse Mother   . Arthritis Mother   . Depression Mother   . Early death Mother   . Hyperlipidemia Mother   . Kidney disease Mother   . Mental illness Mother   . Epilepsy Mother   . Rheum arthritis Sister   . Heart murmur Sister   . Alcohol abuse Sister   . Bipolar disorder Sister   . Cirrhosis Sister   . Heart murmur Brother   . Alcohol abuse Brother   . Depression Brother   . Mental illness Brother        bipolar  . Alcohol abuse Father   . Depression Father   . Mental illness Father   . Arthritis Maternal Grandmother   . Birth defects Maternal Grandmother   . Hyperlipidemia Maternal Grandmother   . Hyperlipidemia Maternal Grandfather   . Alcohol abuse Maternal Grandfather   . Arthritis Maternal Grandfather   . Diabetes Maternal Grandfather   . Stroke Maternal Grandfather   . Arthritis Paternal Grandmother   . Arthritis Paternal Grandfather   . Birth defects Brother   . Mental illness Brother        bipolar  . Depression Brother   . Mental illness Brother    Allergies as of 11/15/2019      Reactions   Ciprofloxacin Other (See Comments)   intolerance   Septra [sulfamethoxazole-trimethoprim] Other (See Comments)   Thrush   Tylenol [acetaminophen] Other (See Comments)   Patient stated she can't take it due to liver      Medication List       Accurate as of November 15, 2019  5:31 PM. If you have any questions, ask your nurse or doctor.        Cholecalciferol 25 MCG (1000 UT) tablet Take 1 tablet (1,000 Units total) by mouth daily.   DULoxetine 60 MG capsule Commonly known as:  CYMBALTA Take 1 capsule (60 mg total) by mouth daily.   folic acid 1 MG tablet Commonly known as: FOLVITE Take 1 tablet (1 mg total) by mouth daily.   furosemide 40 MG tablet Commonly known as: LASIX 1 tab twice a day for 1 day, then 1 tab QD What changed: additional instructions   gabapentin 300 MG capsule Commonly known as: NEURONTIN Take 1 capsule (300 mg total) by mouth at bedtime.   levonorgestrel 20 MCG/24HR IUD Commonly known as: MIRENA 1 each by  Intrauterine route continuous.   magnesium chloride 64 MG Tbec SR tablet Commonly known as: SLOW-MAG Take 2 tablets (128 mg total) by mouth daily.   metoprolol succinate 50 MG 24 hr tablet Commonly known as: TOPROL-XL Take 1 tablet (50 mg total) by mouth daily. Take with or immediately following a meal.   multivitamin tablet Take 1 tablet by mouth daily.   naltrexone 50 MG tablet Commonly known as: DEPADE Take 1 tablet (50 mg total) by mouth daily.   omeprazole 40 MG capsule Commonly known as: PRILOSEC Take 1 capsule (40 mg total) by mouth daily.   QUEtiapine 300 MG tablet Commonly known as: SEROQUEL Take 1 tablet (300 mg total) by mouth at bedtime.   thiamine 100 MG tablet Take 1 tablet (100 mg total) by mouth daily.       All past medical history, surgical history, allergies, family history, immunizations andmedications were updated in the EMR today and reviewed under the history and medication portions of their EMR.     ROS: Negative, with the exception of above mentioned in HPI   Objective:  BP 115/80 (BP Location: Left Arm, Patient Position: Sitting, Cuff Size: Large)   Pulse 100   Temp 97.9 F (36.6 C) (Temporal)   Resp 18   Ht 5' 3"  (1.6 m)   Wt 291 lb 8 oz (132.2 kg)   SpO2 91%   BMI 51.64 kg/m  Body mass index is 51.64 kg/m. Gen: Afebrile.  Intoxicated. HENT: AT. [place].  No new oral lesions.  Tongue swelling has decreased. Eyes:Pupils Equal Round Reactive to light, Extraocular movements  intact,  Conjunctiva without redness, discharge or icterus. Neck/lymp/endocrine: Supple, no lymphadenopathy CV: RRR no murmur, trace bilaterally edema Chest: CTAB, no wheeze or crackles Neuro: Unsteady due to intoxication. PERLA. EOMi. Alert. Oriented.  Psych: Normal dress.mild slurred speech.  Impaired judgment.  Reverting to childish mannerisms and speaking a foreign language to her husband when angry.  No exam data present No results found. No results found for this or any previous visit (from the past 24 hour(s)).  Assessment/Plan: Rache Klimaszewski is a 34 y.o. female present for OV for  NASH (nonalcoholic steatohepatitis)/Bilateral lower extremity edema/Alcohol abuse/intoxication -Lengthy, frank conversation again with [place] and this time her husband was present.  She is intoxicated today, which is the first she has come to her appointments intoxicated.  Laboratory results were discussed with them today.  Alieyah and her husband understand her actions are severely harming her body.  He is pleading with her to go to the emergency room and rehab admission thereafter. -  I strongly feel she should have a hospital admission for medically supervised alcohol detox and then direct admission into a rehab facility TODAY.  She understands the health risks associated with attempting to detox on her own at home> with most severe of those being seizure or death.  Her husband were provided with education on alcohol withdrawal, cirrhosis and alcoholism -She does not appear fluid overloaded today.  She has lost 19 pounds in 5 days with the Lasix.  Her oxygen saturation remains low at 91% and she endorses some mild shortness of breath over the last few days.  -Chest x-ray ordered secondary to decreased oxygen saturation>> patient and her husband was told to report to the emergency room, however I suspect he will not be able to convince her to go.  In the event they are to get a chest x-ray outpatient. -May continue  to use Lasix 1 tab daily  if swelling occurs only. -Avoidance of alcohol was recommended-under medical supervision - Comp Met (CMET)> severely  elevated AST/ALT (623/212), 197, total bili 1.9 - B Nat Peptide> normal - CBC w/Diff> abnl - Protime-INR ( [facility] ONLY)> WNL - Ammonia> 287 -Urgent follow-up with gastroenterology concerning her severely elevated liver enzymes, alk phos and bilirubin.  Glossitis/b12 deficiency/folate deficiency/hypomagnesium Tongue swelling is improving.  Ulceration is healing. Continue B12, folate and magnesium continue B complex/thiamine and folic acid prescribed to ensure she is taking adequate supplementation. She understands this is caused by her alcoholism.   Reviewed expectations re: course of current medical issues.  Discussed self-management of symptoms.  Outlined signs and symptoms indicating need for more acute intervention.  Patient verbalized understanding and all questions were answered.  Patient received an After-Visit Summary.    Orders Placed This Encounter  Procedures  . DG Chest 2 View  . Ambulatory referral to Gastroenterology   No orders of the defined types were placed in this encounter.   Referral Orders     Ambulatory referral to Gastroenterology    Note is dictated utilizing voice recognition software. Although note has been proof read prior to signing, occasional typographical errors still can be missed. If any questions arise, please do not hesitate to call for verification.   electronically signed by:  Howard Pouch, DO  [place]

## 2019-11-15 NOTE — Telephone Encounter (Signed)
Please call patient's husband, okay by DPR and patient was intoxicated today.  I did place an order for chest x-ray since she was having some shortness of breath and her oxygen saturation was mildly decreased.  If she does not go to the emergency room for medically assisted alcohol detox today or tomorrow, then I would advise her to please have her chest x-ray completed tomorrow at [name]

## 2019-11-15 NOTE — Patient Instructions (Signed)
I recommend you report to ED for alcohol detox- medically supervised for safety and then admit in to rehab.   You can stop the lasix for now. Use once a day if legs swell only.   You need follow up with gastroenterology as well. They will call you to schedule.

## 2019-11-15 NOTE — Telephone Encounter (Signed)
Pts husband was called and detailed VM was left on identifying VM asking him to call back if he needed more clarification

## 2019-11-16 NOTE — Telephone Encounter (Signed)
Pt went to [facility] yesterday and is currently inpatient. Chest xray was not completed.

## 2019-11-18 MED ORDER — GENERIC EXTERNAL MEDICATION
Status: DC
Start: ? — End: 2019-11-18

## 2019-11-18 MED ORDER — ENOXAPARIN SODIUM 40 MG/0.4ML ~~LOC~~ SOLN
40.00 | SUBCUTANEOUS | Status: DC
Start: 2019-11-26 — End: 2019-11-18

## 2019-11-18 MED ORDER — GENERIC EXTERNAL MEDICATION
250.00 | Status: DC
Start: 2019-11-18 — End: 2019-11-18

## 2019-11-18 MED ORDER — ONDANSETRON HCL 4 MG/2ML IJ SOLN
4.00 | INTRAMUSCULAR | Status: DC
Start: ? — End: 2019-11-18

## 2019-11-18 MED ORDER — PANTOPRAZOLE SODIUM 40 MG PO TBEC
40.00 | DELAYED_RELEASE_TABLET | ORAL | Status: DC
Start: 2019-11-27 — End: 2019-11-18

## 2019-11-18 MED ORDER — GABAPENTIN 300 MG PO CAPS
600.00 | ORAL_CAPSULE | ORAL | Status: DC
Start: 2019-11-18 — End: 2019-11-18

## 2019-11-18 MED ORDER — CLONIDINE HCL 0.1 MG PO TABS
0.10 | ORAL_TABLET | ORAL | Status: DC
Start: ? — End: 2019-11-18

## 2019-11-18 MED ORDER — MELATONIN 3 MG PO TABS
3.00 | ORAL_TABLET | ORAL | Status: DC
Start: 2019-11-26 — End: 2019-11-18

## 2019-11-18 MED ORDER — SODIUM CHLORIDE 0.9 % IV SOLN
10.00 | INTRAVENOUS | Status: DC
Start: ? — End: 2019-11-18

## 2019-11-18 MED ORDER — MUPIROCIN 2 % EX OINT
TOPICAL_OINTMENT | CUTANEOUS | Status: DC
Start: 2019-11-18 — End: 2019-11-18

## 2019-11-18 MED ORDER — QUETIAPINE FUMARATE 25 MG PO TABS
50.00 | ORAL_TABLET | ORAL | Status: DC
Start: 2019-11-26 — End: 2019-11-18

## 2019-11-22 MED ORDER — ACCU-PRO PUMP SET/VENT MISC
2.50 | Status: DC
Start: ? — End: 2019-11-22

## 2019-11-22 MED ORDER — FOLIC ACID 1 MG PO TABS
1.00 | ORAL_TABLET | ORAL | Status: DC
Start: 2019-11-27 — End: 2019-11-22

## 2019-11-22 MED ORDER — THERA PO TABS
1.00 | ORAL_TABLET | ORAL | Status: DC
Start: 2019-11-27 — End: 2019-11-22

## 2019-11-22 MED ORDER — CHOLECALCIFEROL 25 MCG (1000 UT) PO TABS
2000.00 | ORAL_TABLET | ORAL | Status: DC
Start: 2019-11-27 — End: 2019-11-22

## 2019-11-22 MED ORDER — GABAPENTIN 300 MG PO CAPS
300.00 | ORAL_CAPSULE | ORAL | Status: DC
Start: 2019-11-26 — End: 2019-11-22

## 2019-11-22 MED ORDER — ACETAMINOPHEN 325 MG PO TABS
325.00 | ORAL_TABLET | ORAL | Status: DC
Start: ? — End: 2019-11-22

## 2019-11-22 MED ORDER — GENERIC EXTERNAL MEDICATION
Status: DC
Start: ? — End: 2019-11-22

## 2019-11-22 MED ORDER — THIAMINE HCL 100 MG PO TABS
100.00 | ORAL_TABLET | ORAL | Status: DC
Start: 2019-11-27 — End: 2019-11-22

## 2019-11-25 MED ORDER — BUTALBITAL-APAP-CAFFEINE 50-325-40 MG PO TABS
1.00 | ORAL_TABLET | ORAL | Status: DC
Start: ? — End: 2019-11-25

## 2019-11-25 MED ORDER — GENERIC EXTERNAL MEDICATION
Status: DC
Start: ? — End: 2019-11-25

## 2019-11-28 ENCOUNTER — Telehealth: Payer: Self-pay

## 2019-11-28 MED ORDER — GENERIC EXTERNAL MEDICATION
Status: DC
Start: ? — End: 2019-11-28

## 2019-11-28 NOTE — Telephone Encounter (Signed)
Patient states she has taking Zofran in the past, she is requesting a refill  [facility]  Patient can be reached at [phone]

## 2019-11-29 NOTE — Telephone Encounter (Signed)
Pt was called and VM was left advising her she would need to schedule appt to discuss why she needed this medication. Pt told this medication is generally a short term medication and used for acute issues. Pt advised she needed to schedule appt.

## 2019-12-05 ENCOUNTER — Other Ambulatory Visit: Payer: Self-pay

## 2019-12-05 ENCOUNTER — Ambulatory Visit (INDEPENDENT_AMBULATORY_CARE_PROVIDER_SITE_OTHER): Payer: 59 | Admitting: Family Medicine

## 2019-12-05 ENCOUNTER — Encounter: Payer: Self-pay | Admitting: Family Medicine

## 2019-12-05 VITALS — BP 130/89 | HR 98 | Temp 97.7°F | Resp 19 | Ht 63.0 in | Wt 278.4 lb

## 2019-12-05 DIAGNOSIS — I10 Essential (primary) hypertension: Secondary | ICD-10-CM

## 2019-12-05 DIAGNOSIS — R000 Tachycardia, unspecified: Secondary | ICD-10-CM | POA: Diagnosis not present

## 2019-12-05 DIAGNOSIS — R251 Tremor, unspecified: Secondary | ICD-10-CM

## 2019-12-05 DIAGNOSIS — R7989 Other specified abnormal findings of blood chemistry: Secondary | ICD-10-CM | POA: Diagnosis not present

## 2019-12-05 DIAGNOSIS — F1011 Alcohol abuse, in remission: Secondary | ICD-10-CM | POA: Diagnosis not present

## 2019-12-05 DIAGNOSIS — F418 Other specified anxiety disorders: Secondary | ICD-10-CM

## 2019-12-05 DIAGNOSIS — K7030 Alcoholic cirrhosis of liver without ascites: Secondary | ICD-10-CM | POA: Diagnosis not present

## 2019-12-05 MED ORDER — NALTREXONE HCL 50 MG PO TABS: 50.0000 mg | ORAL_TABLET | Freq: Every day | ORAL | 1 refills | Status: DC

## 2019-12-05 MED ORDER — METOPROLOL SUCCINATE ER 50 MG PO TB24: 50.0000 mg | ORAL_TABLET | Freq: Every day | ORAL | 1 refills | Status: DC

## 2019-12-05 MED ORDER — ONDANSETRON HCL 4 MG PO TABS: 4.0000 mg | ORAL_TABLET | Freq: Three times a day (TID) | ORAL | 2 refills | Status: DC | PRN

## 2019-12-05 MED ORDER — QUETIAPINE FUMARATE 300 MG PO TABS: 300.0000 mg | ORAL_TABLET | Freq: Every day | ORAL | 1 refills | Status: DC

## 2019-12-05 MED ORDER — GABAPENTIN 300 MG PO CAPS: 300.0000 mg | ORAL_CAPSULE | Freq: Three times a day (TID) | ORAL | 1 refills | Status: DC

## 2019-12-05 MED ORDER — DULOXETINE HCL 60 MG PO CPEP: 60.0000 mg | ORAL_CAPSULE | Freq: Every day | ORAL | 1 refills | Status: DC

## 2019-12-05 NOTE — Progress Notes (Signed)
This visit occurred during the SARS-CoV-2 public health emergency.  Safety protocols were in place, including screening questions prior to the visit, additional usage of staff PPE, and extensive cleaning of exam room while observing appropriate contact time as indicated for disinfecting solutions.    Brandi Kirk , 21-Apr-1986, 34 y.o., female MRN: [id number] Patient Care Team    Relationship Specialty Notifications Start End  [name], DO PCP - General Family Medicine  03/15/18   [name], MD Consulting Physician Obstetrics and Gynecology  03/15/18   Wonda Horner, MD Consulting Physician Gastroenterology  03/15/18     Chief Complaint  Patient presents with  . Nausea    Pt would zofran for nausea spells.      Subjective: Pt presents for an OV with complaints of nausea.  Pt was admitted 2/16-2/27/2021 for alcohol detoxification. She was provided with 5 days of ativan on DC for withdrawal. She declined psych evaluation  referral on discharge.  She presents today with her husband and reports she has been alcohol free since her admission date stated above.  She is feeling much improved.  She is starting to get her appetite back.  However she is having some bouts with nausea on occasions.  She has taking Zofran in the past for the and would like a prescription to continue.  She reports she is taking her vitamin D, magnesium, B complex, thiamine, folate, and iron supplements.  She and her husband are looking into resources to continue her rehabilitation process as an outpatient.  She has not looked into attending [place] meetings.  She states the thought of sharing her story makes her nervous. She is concerned about the tremors she has continued to have, and are worsening. She has not needed Lasix since the occurrence 11/11/2019.  She has not followed up with gastroenterology as of yet but they are calling to make the appointment today.  Reviewed labs and discharge summary from her  inpatient admission.    On day of discharge 11/26/2019: CBC: Hemoglobin 11.6, hematocrit 36.2, MCV 108, MCH 34.6, RDW 60.  Platelets 348. CMP: ALT 60, AST 56, creatinine 0.44 Folate 12.9 Ammonia 74 Vitamin B12 630 Vitamin D 20.5 TSH 5.68, free T4 1.07 Hepatitis C negative 11/16/2019 HIV - 10/1718 21 Hepatitis A- 11/15/2019 Hepatitis B - 11/15/2019  No flowsheet data found.  Allergies  Allergen Reactions  . Ciprofloxacin Other (See Comments)    intolerance  . Septra [Sulfamethoxazole-Trimethoprim] Other (See Comments)    Thrush   . Tylenol [Acetaminophen] Other (See Comments)    Patient stated she can't take it due to liver   Social History   Social History Narrative   Married. 1 child.    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    Alcohol abuse. Smoker.   Smoke alarm in the home   feels safe in her relationships.    Past Medical History:  Diagnosis Date  . Alcohol addiction ([place])   . Anxiety   . Back pain 03/18/2016  . Bipolar 1 disorder ([place])   . Blood in stool 07/2017   internal hemorrhoid   . Chicken pox   . 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 Depression   . Fractured rib 2019  . Frequent headaches   . Frequent UTI   . GERD (gastroesophageal reflux disease)   . HPV in female   . Hypertension   . Hypomagnesemia 11/17/2017  . Migraine   . PCOS (polycystic ovarian syndrome)   .  Prediabetes   . Vocal cord dysfunction    Past Surgical History:  Procedure Laterality Date  . WISDOM TOOTH EXTRACTION     Family History  Problem Relation Age of Onset  . Diabetes Mother   . Stroke Mother 12  . Hypertension Mother   . Heart murmur Mother   . Alcohol abuse Mother   . Arthritis Mother   . Depression Mother   . Early death Mother   . Hyperlipidemia Mother   . Kidney disease Mother   . Mental illness Mother   . Epilepsy Mother   . Rheum arthritis Sister   . Heart murmur Sister   . Alcohol abuse Sister   . Bipolar disorder Sister   . Cirrhosis Sister   .  Heart murmur Brother   . Alcohol abuse Brother   . Depression Brother   . Mental illness Brother        bipolar  . Alcohol abuse Father   . Depression Father   . Mental illness Father   . Arthritis Maternal Grandmother   . Birth defects Maternal Grandmother   . Hyperlipidemia Maternal Grandmother   . Hyperlipidemia Maternal Grandfather   . Alcohol abuse Maternal Grandfather   . Arthritis Maternal Grandfather   . Diabetes Maternal Grandfather   . Stroke Maternal Grandfather   . Arthritis Paternal Grandmother   . Arthritis Paternal Grandfather   . Birth defects Brother   . Mental illness Brother        bipolar  . Depression Brother   . Mental illness Brother    Allergies as of 12/05/2019      Reactions   Ciprofloxacin Other (See Comments)   intolerance   Septra [sulfamethoxazole-trimethoprim] Other (See Comments)   Thrush   Tylenol [acetaminophen] Other (See Comments)   Patient stated she can't take it due to liver      Medication List       Accurate as of December 05, 2019 11:59 PM. If you have any questions, ask your nurse or doctor.        Cholecalciferol 25 MCG (1000 UT) tablet Take 1 tablet (1,000 Units total) by mouth daily.   DULoxetine 60 MG capsule Commonly known as: CYMBALTA Take 1 capsule (60 mg total) by mouth daily.   folic acid 1 MG tablet Commonly known as: FOLVITE Take 1 tablet (1 mg total) by mouth daily.   furosemide 40 MG tablet Commonly known as: LASIX 1 tab twice a day for 1 day, then 1 tab QD What changed: additional instructions   gabapentin 300 MG capsule Commonly known as: NEURONTIN Take 1 capsule (300 mg total) by mouth 3 (three) times daily. What changed: when to take this Changed by: Howard Pouch, DO   levonorgestrel 20 MCG/24HR IUD Commonly known as: MIRENA 1 each by Intrauterine route continuous.   magnesium chloride 64 MG Tbec SR tablet Commonly known as: SLOW-MAG Take 2 tablets (128 mg total) by mouth daily.   metoprolol  succinate 50 MG 24 hr tablet Commonly known as: TOPROL-XL Take 1 tablet (50 mg total) by mouth daily. Take with or immediately following a meal.   multivitamin tablet Take 1 tablet by mouth daily.   naltrexone 50 MG tablet Commonly known as: DEPADE Take 1 tablet (50 mg total) by mouth daily.   omeprazole 40 MG capsule Commonly known as: PRILOSEC Take 1 capsule (40 mg total) by mouth daily.   ondansetron 4 MG tablet Commonly known as: ZOFRAN Take 1 tablet (4 mg total)  by mouth every 8 (eight) hours as needed for nausea or vomiting. Started by: Howard Pouch, DO   QUEtiapine 300 MG tablet Commonly known as: SEROQUEL Take 1 tablet (300 mg total) by mouth at bedtime.   thiamine 100 MG tablet Take 1 tablet (100 mg total) by mouth daily.       All past medical history, surgical history, allergies, family history, immunizations andmedications were updated in the EMR today and reviewed under the history and medication portions of their EMR.     ROS: Negative, with the exception of above mentioned in HPI   Objective:  BP 130/89 (BP Location: Left Arm, Patient Position: Sitting, Cuff Size: Large)   Pulse 98   Temp 97.7 F (36.5 C) (Temporal)   Resp 19   Ht 5' 3"  (1.6 m)   Wt 278 lb 6 oz (126.3 kg)   SpO2 96%   BMI 49.31 kg/m  Body mass index is 49.31 kg/m. Gen: Afebrile. No acute distress. Nontoxic in appearance, well developed, well nourished.  HENT: AT. [place].  Eyes:Pupils Equal Round Reactive to light, Extraocular movements intact,  Conjunctiva without redness, discharge or icterus. CV: RRR no murmur, no edema Chest: CTAB, no wheeze or crackles. Good air movement, normal resp effort.  Abd: Soft. NTND. BS present Skin: No rashes, purpura or petechiae.  Neuro:  Normal gait. PERLA. EOMi. Alert. Oriented x3  Psych: Normal affect, dress and demeanor. Normal speech. Normal thought content and judgment.  No exam data present No results found. Results for orders placed or  performed in visit on 12/05/19 (from the past 24 hour(s))  Protime-INR ( [facility] ONLY)     Status: None   Collection Time: 12/05/19  2:16 PM  Result Value Ref Range   INR 1.1    Prothrombin Time 11.1 9.0 - 11.5 sec    Assessment/Plan: Hajar Penninger is a 34 y.o. female present for OV for  Essential hypertension/tachycardia/tremor Stable blood pressure.  Patient has not taken her medication yet today. Heart rate is also well controlled on medication. May consider exchanging metoprolol for propanolol to help with her blood pressure and tremors.  Will wait till follow-up in hopes that the gabapentin provides her with adequate coverage. Could also consider primidone for tremor.   Depression with anxiety Patient does report a little increased anxiety. Continue Seroquel Added back fluoxetine 10 mg daily Increase gabapentin to 300 mg 3 times daily  Alcohol use disorder, mild, in early remission/Alcoholic cirrhosis, unspecified whether ascites present [facility]) Patient has been alcohol free since her admission.  Congratulated her. Continue naltrexone 50 mg Strongly encouraged her to make a follow-up appointment with her gastroenterologist Continue supplements of thiamine, magnesium, folate, and B12 - CBC w/Diff - Comp Met (CMET) - Protime-INR ( [facility] ONLY)  Hypomagnesemia/vitamin D deficiency/iron deficiency/thiamine deficiency/folate deficiency/B12 deficiency Continue magnesium, vitamin D, iron, thiamine, folate and B12 supplementations. - Magnesium  Elevated TSH We will recheck TSH levels today-elevated during her hospital stay. - TSH   Reviewed expectations re: course of current medical issues.  Discussed self-management of symptoms.  Outlined signs and symptoms indicating need for more acute intervention.  Patient verbalized understanding and all questions were answered.  Patient received an After-Visit Summary.    Orders Placed This Encounter  Procedures  . CBC w/Diff   . Comp Met (CMET)  . Magnesium  . TSH  . Protime-INR ( [facility] ONLY)   Meds ordered this encounter  Medications  . metoprolol succinate (TOPROL-XL) 50 MG 24 hr tablet  Sig: Take 1 tablet (50 mg total) by mouth daily. Take with or immediately following a meal.    Dispense:  90 tablet    Refill:  1  . QUEtiapine (SEROQUEL) 300 MG tablet    Sig: Take 1 tablet (300 mg total) by mouth at bedtime.    Dispense:  90 tablet    Refill:  1    Sig change. DC prior script. She wants to pick up today  . naltrexone (DEPADE) 50 MG tablet    Sig: Take 1 tablet (50 mg total) by mouth daily.    Dispense:  90 tablet    Refill:  1  . gabapentin (NEURONTIN) 300 MG capsule    Sig: Take 1 capsule (300 mg total) by mouth 3 (three) times daily.    Dispense:  270 capsule    Refill:  1    DC prior script  . DULoxetine (CYMBALTA) 60 MG capsule    Sig: Take 1 capsule (60 mg total) by mouth daily.    Dispense:  90 capsule    Refill:  1  . ondansetron (ZOFRAN) 4 MG tablet    Sig: Take 1 tablet (4 mg total) by mouth every 8 (eight) hours as needed for nausea or vomiting.    Dispense:  60 tablet    Refill:  2   Referral Orders  No referral(s) requested today     Note is dictated utilizing voice recognition software. Although note has been proof read prior to signing, occasional typographical errors still can be missed. If any questions arise, please do not hesitate to call for verification.   electronically signed by:  Howard Pouch, DO  [place]

## 2019-12-05 NOTE — Patient Instructions (Addendum)
Follow up in 8 weeks- we will repeat labs that appt.  Great job Brandi Kirk- I am very proud of you.  I have refilled your medication for you.  Added on fluoxetine.  Increase gabapentin as we discussed.  Look into AA meetings and one of the following placed below.      Available outpatient help with alcohol addiction.   [facility] [place] [facility] [address]  [place], [place] [address] [phone]  [place], [place] [address] [phone]  [facility] [address] [place], [place] [address] [phone]  [place] [address], [place], [place] [address] [phone]  [place], [place] [address] [phone]  [facility] [place], [place], [place] [address] [phone]  [place], [place] [address] [phone]  [place] [facility] [place] [facility]  [facility] [place] [address] [place], [place] [address] [phone]  [place], [place] [address] [phone]  [facility]  [address] [place], [place] [address] [phone]   [place] [address] [place], [place] [address] [phone]  [place]  [address] [place], [place] [address] [phone]  [place] [address] [place], [place] [address] [phone]  [place] [address] [place], [place] [address] [phone]  [place] [address] [place], [place] [address] [phone]  [place]  [address] [place], [place] [address] [phone]  Dr. Geanie Kenning III MD [address], [place], [place] [address] [phone]  [facility] [address] [place], [place] [address] [phone]  [place] [address] [place],  [address] [phone]

## 2019-12-06 ENCOUNTER — Telehealth: Payer: Self-pay | Admitting: Family Medicine

## 2019-12-06 ENCOUNTER — Encounter: Payer: Self-pay | Admitting: Family Medicine

## 2019-12-06 DIAGNOSIS — R251 Tremor, unspecified: Secondary | ICD-10-CM | POA: Insufficient documentation

## 2019-12-06 LAB — COMPREHENSIVE METABOLIC PANEL
ALT: 102 U/L — ABNORMAL HIGH (ref 0–35)
AST: 71 U/L — ABNORMAL HIGH (ref 0–37)
Albumin: 4.3 g/dL (ref 3.5–5.2)
Alkaline Phosphatase: 119 U/L — ABNORMAL HIGH (ref 39–117)
BUN: 16 mg/dL (ref 6–23)
CO2: 26 mEq/L (ref 19–32)
Calcium: 9.8 mg/dL (ref 8.4–10.5)
Chloride: 103 mEq/L (ref 96–112)
Creatinine, Ser: 0.6 mg/dL (ref 0.40–1.20)
GFR: 116.12 mL/min (ref >60.00)
Glucose, Bld: 118 mg/dL — ABNORMAL HIGH (ref 70–99)
Potassium: 4.9 mEq/L (ref 3.5–5.1)
Sodium: 138 mEq/L (ref 135–145)
Total Bilirubin: 0.7 mg/dL (ref 0.2–1.2)
Total Protein: 7.8 g/dL (ref 6.0–8.3)

## 2019-12-06 LAB — CBC WITH DIFFERENTIAL/PLATELET
Basophils Absolute: 0.1 10*3/uL (ref 0.0–0.1)
Basophils Relative: 1.5 % (ref 0.0–3.0)
Eosinophils Absolute: 0.3 10*3/uL (ref 0.0–0.7)
Eosinophils Relative: 3.5 % (ref 0.0–5.0)
HCT: 42.2 % (ref 36.0–46.0)
Hemoglobin: 14.1 g/dL (ref 12.0–15.0)
Lymphocytes Relative: 20.8 % (ref 12.0–46.0)
Lymphs Abs: 1.6 10*3/uL (ref 0.7–4.0)
MCHC: 33.5 g/dL (ref 30.0–36.0)
MCV: 103.9 fl — ABNORMAL HIGH (ref 78.0–100.0)
Monocytes Absolute: 0.6 10*3/uL (ref 0.1–1.0)
Monocytes Relative: 7.9 % (ref 3.0–12.0)
Neutro Abs: 5.1 10*3/uL (ref 1.4–7.7)
Neutrophils Relative %: 66.3 % (ref 43.0–77.0)
Platelets: 413 10*3/uL — ABNORMAL HIGH (ref 150.0–400.0)
RBC: 4.06 Mil/uL (ref 3.87–5.11)
RDW: 14.6 % (ref 11.5–15.5)
WBC: 7.7 10*3/uL (ref 4.0–10.5)

## 2019-12-06 LAB — PROTIME-INR
INR: 1.1
Prothrombin Time: 11.1 s (ref 9.0–11.5)

## 2019-12-06 LAB — TSH: TSH: 1.43 u[IU]/mL (ref 0.35–4.50)

## 2019-12-06 LAB — MAGNESIUM: Magnesium: 1.9 mg/dL (ref 1.5–2.5)

## 2019-12-06 NOTE — Telephone Encounter (Signed)
Pt was called and given information/results, she verbalized understanding

## 2019-12-06 NOTE — Telephone Encounter (Signed)
Please call patient: Thyroid is functioning normal. Her magnesium is in normal range, continue supplement-may decrease dose if experiencing diarrhea.  Otherwise we will recheck at her follow-up  Her liver enzymes have improved from when they were checked here on 11/11/2019.  However they are mildly elevated from her hospital discharge.  Continue to avoid alcohol use and avoid Tylenol use as well.  Her platelets are normal and her bleeding time is normal.  Therefore if she has a headache she can take Advil or naproxen.  Would still attempt to avoid using an NSAID routinely but if she needs to -those are okay options.  These remind her to make the appointment with the gastroenterologist office if she has not already.  Also follow-up here in 8 weeks.  Sooner if needed.

## 2019-12-07 ENCOUNTER — Encounter: Payer: Self-pay | Admitting: Family Medicine

## 2020-01-11 ENCOUNTER — Ambulatory Visit: Payer: 59 | Admitting: Family Medicine

## 2020-01-18 ENCOUNTER — Ambulatory Visit: Payer: 59 | Admitting: Family Medicine

## 2020-01-18 ENCOUNTER — Ambulatory Visit: Payer: 59 | Admitting: Gastroenterology

## 2020-02-17 ENCOUNTER — Other Ambulatory Visit (INDEPENDENT_AMBULATORY_CARE_PROVIDER_SITE_OTHER): Payer: 59

## 2020-02-17 ENCOUNTER — Encounter: Payer: Self-pay | Admitting: Gastroenterology

## 2020-02-17 ENCOUNTER — Ambulatory Visit: Payer: 59 | Admitting: Gastroenterology

## 2020-02-17 DIAGNOSIS — F1029 Alcohol dependence with unspecified alcohol-induced disorder: Secondary | ICD-10-CM

## 2020-02-17 DIAGNOSIS — K7581 Nonalcoholic steatohepatitis (NASH): Secondary | ICD-10-CM

## 2020-02-17 LAB — COMPREHENSIVE METABOLIC PANEL
ALT: 51 U/L — ABNORMAL HIGH (ref 0–35)
AST: 49 U/L — ABNORMAL HIGH (ref 0–37)
Albumin: 4 g/dL (ref 3.5–5.2)
Alkaline Phosphatase: 108 U/L (ref 39–117)
BUN: 9 mg/dL (ref 6–23)
CO2: 29 mEq/L (ref 19–32)
Calcium: 9.1 mg/dL (ref 8.4–10.5)
Chloride: 98 mEq/L (ref 96–112)
Creatinine, Ser: 0.68 mg/dL (ref 0.40–1.20)
GFR: 100.37 mL/min (ref >60.00)
Glucose, Bld: 134 mg/dL — ABNORMAL HIGH (ref 70–99)
Potassium: 4 mEq/L (ref 3.5–5.1)
Sodium: 133 mEq/L — ABNORMAL LOW (ref 135–145)
Total Bilirubin: 1.1 mg/dL (ref 0.2–1.2)
Total Protein: 7.1 g/dL (ref 6.0–8.3)

## 2020-02-17 LAB — CBC WITH DIFFERENTIAL/PLATELET
Basophils Absolute: 0 10*3/uL (ref 0.0–0.1)
Basophils Relative: 0.1 % (ref 0.0–3.0)
Eosinophils Absolute: 0.1 10*3/uL (ref 0.0–0.7)
Eosinophils Relative: 1 % (ref 0.0–5.0)
HCT: 39.6 % (ref 36.0–46.0)
Hemoglobin: 13.5 g/dL (ref 12.0–15.0)
Lymphocytes Relative: 15.7 % (ref 12.0–46.0)
Lymphs Abs: 1.1 10*3/uL (ref 0.7–4.0)
MCHC: 33.9 g/dL (ref 30.0–36.0)
MCV: 95.8 fl (ref 78.0–100.0)
Monocytes Absolute: 0.6 10*3/uL (ref 0.1–1.0)
Monocytes Relative: 8.6 % (ref 3.0–12.0)
Neutro Abs: 5.2 10*3/uL (ref 1.4–7.7)
Neutrophils Relative %: 74.6 % (ref 43.0–77.0)
Platelets: 231 10*3/uL (ref 150.0–400.0)
RBC: 4.14 Mil/uL (ref 3.87–5.11)
RDW: 15.6 % — ABNORMAL HIGH (ref 11.5–15.5)
WBC: 7 10*3/uL (ref 4.0–10.5)

## 2020-02-17 LAB — PROTIME-INR
INR: 1 ratio (ref 0.8–1.0)
Prothrombin Time: 11.2 s (ref 9.6–13.1)

## 2020-02-17 NOTE — Progress Notes (Signed)
Brandi Kirk    [id number]    [birth year]  Primary Care Physician:Kirk, Brandi Raddle, DO  Referring Physician: [name], DO [address] [place],  [place] [address]   Chief complaint: Fatty liver, [name]  HPI:  34 year old female here for follow-up visit for metabolic and alcoholic liver disease, accompanied by her husband  Patient is back to drinking 2-3 cocktails per night.  She quit drinking for almost 9 to 10 weeks after she was hospitalized in June 2020.   She started drinking after her sister-in-law passed away and she tried to quit but starts getting withdrawal symptoms and is drinking every day currently.  She was hospitalized at [facility] in February 2021 with acute alcohol withdrawal.   Complains of lower extremity edema, abdominal distention and weight gain.  She was prescribed Lasix by Dr. Raoul Pitch  EGD July 30, 2016: Normal.  Esophageal biopsies negative for EOE or Barrett's esophagus.  Gastric biopsies negative for H. pylori.  Abdominal ultrasound November 25, 2016 showed increased echogenicity of hepatic parenchyma suggestive of fatty infiltration.  Abdominal ultrasound with elastography June 27, 2019: Coarse echogenic liver compatible with diffuse hepatic steatosis fibrosis score F0/F1  Hepatitis A, B, C and HIV negative  12/05/2019: INR 1.1   Hepatic Function Latest Ref Rng & Units 12/05/2019 11/11/2019 10/26/2019  Total Protein 6.0 - 8.3 g/dL 7.8 6.8 -  Albumin 3.5 - 5.2 g/dL 4.3 - -  AST 0 - 37 U/L 71(H) 623(H) 16  ALT 0 - 35 U/L 102(H) 212(H) 12  Alk Phosphatase 39 - 117 U/L 119(H) - -  Total Bilirubin 0.2 - 1.2 mg/dL 0.7 1.9(H) -  Bilirubin, Direct 0.0 - 0.3 mg/dL - - -      Outpatient Encounter Medications as of 02/17/2020  Medication Sig  . Cholecalciferol 25 MCG (1000 UT) tablet Take 1 tablet (1,000 Units total) by mouth daily.  . DULoxetine (CYMBALTA) 60 MG capsule Take 1 capsule (60 mg total) by mouth daily.  . folic acid  (FOLVITE) 1 MG tablet Take 1 tablet (1 mg total) by mouth daily.  . furosemide (LASIX) 40 MG tablet 1 tab twice a day for 1 day, then 1 tab QD (Patient taking differently: One tablet daily PRN)  . gabapentin (NEURONTIN) 300 MG capsule Take 1 capsule (300 mg total) by mouth 3 (three) times daily.  Marland Kitchen levonorgestrel (MIRENA) 20 MCG/24HR IUD 1 each by Intrauterine route continuous.   . magnesium chloride (SLOW-MAG) 64 MG TBEC SR tablet Take 2 tablets (128 mg total) by mouth daily.  . metoprolol succinate (TOPROL-XL) 50 MG 24 hr tablet Take 1 tablet (50 mg total) by mouth daily. Take with or immediately following a meal.  . Multiple Vitamin (MULTIVITAMIN) tablet Take 1 tablet by mouth daily.  . naltrexone (DEPADE) 50 MG tablet Take 1 tablet (50 mg total) by mouth daily.  Marland Kitchen omeprazole (PRILOSEC) 40 MG capsule Take 1 capsule (40 mg total) by mouth daily.  . ondansetron (ZOFRAN) 4 MG tablet Take 1 tablet (4 mg total) by mouth every 8 (eight) hours as needed for nausea or vomiting.  Marland Kitchen QUEtiapine (SEROQUEL) 300 MG tablet Take 1 tablet (300 mg total) by mouth at bedtime.  . thiamine 100 MG tablet Take 1 tablet (100 mg total) by mouth daily.   No facility-administered encounter medications on file as of 02/17/2020.    Allergies as of 02/17/2020 - Review Complete 12/06/2019  Allergen Reaction Noted  .  Ciprofloxacin Other (See Comments) 05/26/2013  . Septra [sulfamethoxazole-trimethoprim] Other (See Comments) 11/14/2014  . Tylenol [acetaminophen] Other (See Comments) 06/16/2018    Past Medical History:  Diagnosis Date  . Alcohol addiction ([place])   . Anxiety   . Back pain 03/18/2016  . Bipolar 1 disorder ([place])   . Blood in stool 07/2017   internal hemorrhoid   . Chicken pox   . 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 Depression   . Fractured rib 2019  . Frequent headaches   . Frequent UTI   . GERD (gastroesophageal reflux disease)   . HPV in female   . Hypertension   . Hypomagnesemia 11/17/2017  .  Migraine   . PCOS (polycystic ovarian syndrome)   . Prediabetes   . Vocal cord dysfunction     Past Surgical History:  Procedure Laterality Date  . WISDOM TOOTH EXTRACTION      Family History  Problem Relation Age of Onset  . Diabetes Mother   . Stroke Mother 45  . Hypertension Mother   . Heart murmur Mother   . Alcohol abuse Mother   . Arthritis Mother   . Depression Mother   . Early death Mother   . Hyperlipidemia Mother   . Kidney disease Mother   . Mental illness Mother   . Epilepsy Mother   . Rheum arthritis Sister   . Heart murmur Sister   . Alcohol abuse Sister   . Bipolar disorder Sister   . Cirrhosis Sister   . Heart murmur Brother   . Alcohol abuse Brother   . Depression Brother   . Mental illness Brother        bipolar  . Alcohol abuse Father   . Depression Father   . Mental illness Father   . Arthritis Maternal Grandmother   . Birth defects Maternal Grandmother   . Hyperlipidemia Maternal Grandmother   . Hyperlipidemia Maternal Grandfather   . Alcohol abuse Maternal Grandfather   . Arthritis Maternal Grandfather   . Diabetes Maternal Grandfather   . Stroke Maternal Grandfather   . Arthritis Paternal Grandmother   . Arthritis Paternal Grandfather   . Birth defects Brother   . Mental illness Brother        bipolar  . Depression Brother   . Mental illness Brother     Social History   Socioeconomic History  . Marital status: Married    Spouse name: Not on file  . Number of children: 1  . Years of education: Not on file  . Highest education level: Not on file  Occupational History  . Occupation: stay at home mom  Tobacco Use  . Smoking status: Current Some Day Smoker    Packs/day: 0.25    Years: 15.00    Pack years: 3.75    Types: Cigarettes  . Smokeless tobacco: Never Used  . Tobacco comment: refused  Substance and Sexual Activity  . Alcohol use: Yes    Comment: Binge drinker  . Drug use: No  . Sexual activity: Yes    Partners:  Male    Birth control/protection: I.U.D.  Other Topics Concern  . Not on file  Social History Narrative   Married. 1 child.    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    Alcohol abuse. Smoker.   Smoke alarm in the home   feels safe in her relationships.    Social Determinants of Health   Financial Resource Strain:   . Difficulty of Paying Living  Expenses:   Food Insecurity:   . Worried About Charity fundraiser in the Last Year:   . Arboriculturist in the Last Year:   Transportation Needs:   . Film/video editor (Medical):   Marland Kitchen Lack of Transportation (Non-Medical):   Physical Activity:   . Days of Exercise per Week:   . Minutes of Exercise per Session:   Stress:   . Feeling of Stress :   Social Connections:   . Frequency of Communication with Friends and Family:   . Frequency of Social Gatherings with Friends and Family:   . Attends Religious Services:   . Active Member of Clubs or Organizations:   . Attends Archivist Meetings:   Marland Kitchen Marital Status:   Intimate Partner Violence:   . Fear of Current or Ex-Partner:   . Emotionally Abused:   Marland Kitchen Physically Abused:   . Sexually Abused:       Review of systems:  All other review of systems negative except as mentioned in the HPI.   Physical Exam: Vitals:   02/17/20 1434  BP: 116/72  Pulse: (!) 110  SpO2: 95%   Body mass index is 52.36 kg/m. Gen:      No acute distress HEENT:  EOMI, sclera anicteric Abd:       soft, non-tender; no palpable masses, + distension Ext:   ++ edema Neuro: alert and oriented x 3 Psych: normal mood and affect  Data Reviewed:  Reviewed labs, radiology imaging, old records and pertinent past GI work up   Assessment and Plan/Recommendations:  34 year old female with history of metabolic and alcoholic liver disease, steatohepatitis, alcohol dependence here for follow-up visit  Patient is currently drinking alcohol every day Hospitalized in February 2021, was prescribed  Ativan for 5 days and naltrexone She is reluctant to go to rehab, provided information to call behavioral health to discuss options for alcohol cessation  Check CBC, CMP, PT, INR  Peripheral edema, ascites, volume overload secondary to alcoholic liver disease Continue Lasix Low-sodium diet  GERD: Continue omeprazole and antireflux measures  Return in 3 months or sooner if needed  This visit required 45 minutes of patient care (this includes precharting, chart review, review of results, face-to-face time used for counseling as well as treatment plan and follow-up. The patient was provided an opportunity to ask questions and all were answered. The patient agreed with the plan and demonstrated an understanding of the instructions.  Damaris Hippo , MD    CC: Brandi Hillock, DO

## 2020-02-17 NOTE — Patient Instructions (Addendum)
If you are age 32 or older, your body mass index should be between 23-30. Your Body mass index is 52.36 kg/m. If this is out of the aforementioned range listed, please consider follow up with your Primary Care Provider.  If you are age 63 or younger, your body mass index should be between 19-25. Your Body mass index is 52.36 kg/m. If this is out of the aformentioned range listed, please consider follow up with your Primary Care Provider.   Your provider has requested that you go to the basement level for lab work before leaving today. Press "B" on the elevator. The lab is located at the first door on the left as you exit the elevator.  Due to recent changes in healthcare laws, you may see the results of your imaging and laboratory studies on MyChart before your provider has had a chance to review them.  We understand that in some cases there may be results that are confusing or concerning to you. Not all laboratory results come back in the same time frame and the provider may be waiting for multiple results in order to interpret others.  Please give [place] 48 hours in order for your provider to thoroughly review all the results before contacting the office for clarification of your results.   You were given a leaflet with information regarding Behavioral Health.  Please contact them at [phone].  You will be seen in the office for follow up in 3 months (August 2021).  I appreciate the opportunity to care for you. Thank you for choosing me and [facility],  Dr. Harl Bowie

## 2020-02-21 ENCOUNTER — Ambulatory Visit (INDEPENDENT_AMBULATORY_CARE_PROVIDER_SITE_OTHER): Payer: 59 | Admitting: Family Medicine

## 2020-02-21 ENCOUNTER — Other Ambulatory Visit: Payer: Self-pay

## 2020-02-21 ENCOUNTER — Encounter: Payer: Self-pay | Admitting: Family Medicine

## 2020-02-21 VITALS — BP 113/80 | HR 112 | Temp 98.2°F | Resp 16 | Ht 63.0 in | Wt 294.8 lb

## 2020-02-21 DIAGNOSIS — F1020 Alcohol dependence, uncomplicated: Secondary | ICD-10-CM | POA: Diagnosis not present

## 2020-02-21 DIAGNOSIS — R600 Localized edema: Secondary | ICD-10-CM

## 2020-02-21 DIAGNOSIS — K7030 Alcoholic cirrhosis of liver without ascites: Secondary | ICD-10-CM | POA: Diagnosis not present

## 2020-02-21 MED ORDER — THIAMINE HCL 100 MG PO TABS: 100.0000 mg | ORAL_TABLET | Freq: Every day | ORAL | 1 refills | Status: DC

## 2020-02-21 MED ORDER — FUROSEMIDE 40 MG PO TABS: ORAL_TABLET | ORAL | 0 refills | Status: DC

## 2020-02-21 MED ORDER — FOLIC ACID 1 MG PO TABS: 1.0000 mg | ORAL_TABLET | Freq: Every day | ORAL | 1 refills | Status: DC

## 2020-02-21 NOTE — Progress Notes (Signed)
This visit occurred during the SARS-CoV-2 public health emergency.  Safety protocols were in place, including screening questions prior to the visit, additional usage of staff PPE, and extensive cleaning of exam room while observing appropriate contact time as indicated for disinfecting solutions.    Brandi Kirk , 13-Feb-1986, 34 y.o., female MRN: [id number] Patient Care Team    Relationship Specialty Notifications Start End  Brandi Hillock, DO PCP - General Family Medicine  03/15/18   Brandi Charleston, MD Consulting Physician Obstetrics and Gynecology  03/15/18   Brandi Pole, MD Consulting Physician Gastroenterology  02/22/20     Chief Complaint  Patient presents with  . Edema in legs    gotten worse in the last 1-2 weeks, has not taken anyuthing else besides daily meds     Subjective: name is a 34 y.o. female present for LE edema.  Patient presents with her husband today with complaints of lower extremity edema reoccurring over the last 1 to 2 weeks.  Patient does have cirrhosis with alcoholism and fatty liver disease.  She has been seen by gastroenterology concerning her condition.  Patient reports she has been back to drinking alcohol daily approximately 3 drinks a day.  She reports her sister-in-law passed away and she started drinking again to cope.  She reports she was able to stop drinking for approximately 9-1/2 weeks.  She reports when she was not drinking she had no fluid overload in her legs.  She has not needed Lasix since last prescribed in March.  She does endorse having increased bruising. She has had recent CBC, CMP, PT and INR that was reviewed in the EMR.  Prior note:  Pt presents for an OV with complaints of nausea.  Pt was admitted 2/16-2/27/2021 for alcohol detoxification. She was provided with 5 days of ativan on DC for withdrawal. She declined psych evaluation  referral on discharge.  She presents today with her husband and reports she has been alcohol free  since her admission date stated above.  She is feeling much improved.  She is starting to get her appetite back.  However she is having some bouts with nausea on occasions.  She has taking Zofran in the past for the and would like a prescription to continue.  She reports she is taking her vitamin D, magnesium, B complex, thiamine, folate, and iron supplements.  She and her husband are looking into resources to continue her rehabilitation process as an outpatient.  She has not looked into attending [place] meetings.  She states the thought of sharing her story makes her nervous. She is concerned about the tremors she has continued to have, and are worsening. She has not needed Lasix since the occurrence 11/11/2019.  She has not followed up with gastroenterology as of yet but they are calling to make the appointment today.  Reviewed labs and discharge summary from her inpatient admission.    No flowsheet data found.  Allergies  Allergen Reactions  . Ciprofloxacin Other (See Comments)    intolerance  . Septra [Sulfamethoxazole-Trimethoprim] Other (See Comments)    Thrush   . Tylenol [Acetaminophen] Other (See Comments)    Patient stated she can't take it due to liver   Social History   Social History Narrative   Married. 1 child.    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    Alcohol abuse. Smoker.   Smoke alarm in the home   feels safe in her relationships.    Past  Medical History:  Diagnosis Date  . Alcohol addiction ([place])   . Alcoholic intoxication with complication ([place]) 9/92/4268  . Anxiety   . Back pain 03/18/2016  . Bipolar 1 disorder ([place])   . Blood in stool 07/2017   internal hemorrhoid   . Chicken pox   . 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 Depression   . Fractured rib 2019  . Frequent headaches   . Frequent UTI   . GERD (gastroesophageal reflux disease)   . HPV in female   . Hypertension   . Hypomagnesemia 11/17/2017  . Migraine   . PCOS (polycystic ovarian syndrome)   .  Prediabetes   . Vocal cord dysfunction    Past Surgical History:  Procedure Laterality Date  . WISDOM TOOTH EXTRACTION     Family History  Problem Relation Age of Onset  . Diabetes Mother   . Stroke Mother 30  . Hypertension Mother   . Heart murmur Mother   . Alcohol abuse Mother   . Arthritis Mother   . Depression Mother   . Early death Mother   . Hyperlipidemia Mother   . Kidney disease Mother   . Mental illness Mother   . Epilepsy Mother   . Rheum arthritis Sister   . Heart murmur Sister   . Alcohol abuse Sister   . Bipolar disorder Sister   . Cirrhosis Sister   . Heart murmur Brother   . Alcohol abuse Brother   . Depression Brother   . Mental illness Brother        bipolar  . Alcohol abuse Father   . Depression Father   . Mental illness Father   . Arthritis Maternal Grandmother   . Birth defects Maternal Grandmother   . Hyperlipidemia Maternal Grandmother   . Hyperlipidemia Maternal Grandfather   . Alcohol abuse Maternal Grandfather   . Arthritis Maternal Grandfather   . Diabetes Maternal Grandfather   . Stroke Maternal Grandfather   . Arthritis Paternal Grandmother   . Arthritis Paternal Grandfather   . Birth defects Brother   . Mental illness Brother        bipolar  . Depression Brother   . Mental illness Brother    Allergies as of 02/21/2020      Reactions   Ciprofloxacin Other (See Comments)   intolerance   Septra [sulfamethoxazole-trimethoprim] Other (See Comments)   Thrush   Tylenol [acetaminophen] Other (See Comments)   Patient stated she can't take it due to liver      Medication List       Accurate as of Feb 21, 2020 11:59 PM. If you have any questions, ask your nurse or doctor.        Cholecalciferol 25 MCG (1000 UT) tablet Take 1 tablet (1,000 Units total) by mouth daily.   DULoxetine 60 MG capsule Commonly known as: CYMBALTA Take 1 capsule (60 mg total) by mouth daily.   folic acid 1 MG tablet Commonly known as: FOLVITE Take 1  tablet (1 mg total) by mouth daily.   furosemide 40 MG tablet Commonly known as: LASIX 1 tab twice a day for 1 day, then 1 tab QD What changed: additional instructions   gabapentin 300 MG capsule Commonly known as: NEURONTIN Take 1 capsule (300 mg total) by mouth 3 (three) times daily.   levonorgestrel 20 MCG/24HR IUD Commonly known as: MIRENA 1 each by Intrauterine route continuous.   magnesium chloride 64 MG Tbec SR tablet Commonly known as: SLOW-MAG Take 2  tablets (128 mg total) by mouth daily.   metoprolol succinate 50 MG 24 hr tablet Commonly known as: TOPROL-XL Take 1 tablet (50 mg total) by mouth daily. Take with or immediately following a meal.   multivitamin tablet Take 1 tablet by mouth daily.   naltrexone 50 MG tablet Commonly known as: DEPADE Take 1 tablet (50 mg total) by mouth daily.   omeprazole 40 MG capsule Commonly known as: PRILOSEC Take 1 capsule (40 mg total) by mouth daily.   ondansetron 4 MG tablet Commonly known as: ZOFRAN Take 1 tablet (4 mg total) by mouth every 8 (eight) hours as needed for nausea or vomiting.   QUEtiapine 300 MG tablet Commonly known as: SEROQUEL Take 1 tablet (300 mg total) by mouth at bedtime.   thiamine 100 MG tablet Take 1 tablet (100 mg total) by mouth daily.       All past medical history, surgical history, allergies, family history, immunizations andmedications were updated in the EMR today and reviewed under the history and medication portions of their EMR.     ROS: Negative, with the exception of above mentioned in HPI   Objective:  BP 113/80 (BP Location: Right Arm, Patient Position: Sitting, Cuff Size: Large)   Pulse (!) 112   Temp 98.2 F (36.8 C) (Temporal)   Resp 16   Ht 5' 3"  (1.6 m)   Wt 294 lb 12.8 oz (133.7 kg)   SpO2 94%   BMI 52.22 kg/m  Body mass index is 52.22 kg/m. Gen: Afebrile. No acute distress.  Nontoxic.  Appears intoxicated. HENT: AT. [place]. Eyes:Pupils Equal Round Reactive to  light, Extraocular movements intact,  Conjunctiva without redness, discharge or icterus. CV: RRR no murmur, +1 edema Chest: CTAB, no wheeze or crackles Abd: Soft.  Obese.  Diffusely mildly tender ND. BS present.  No masses palpated.  Skin: No rashes, purpura or petechiae.  Multiple bruising along arms in different stages. Neuro:  Normal gait. PERLA. EOMi. Alert. Oriented. Psych: Patient does appear at least mildly intoxicated today.  She is upset with herself for relapsing.  No exam data present No results found. No results found for this or any previous visit (from the past 24 hour(s)).  Assessment/Plan: Sausha Raymond is a 34 y.o. female present for OV for  Alcohol use disorder/Alcoholic cirrhosis, unspecified whether ascites present (HCC)/lower extremity edema Restart Lasix-instructions provided to patient and her husband as well as written on AVS. Patient and her husband was educated today on alcoholism and effects on the heart, liver and blood cell counts. Patient was counseled on vaccines today. Congratulated her on 9 and half weeks of sobriety and strongly encouraged her to stop drinking again.  She has been offered multiple resources to help her with her journey of sobriety.   She has declined rehab admission.   She has declined outpatient rehabilitation services.   She has declined psychiatric referral. Continue naltrexone Continue supplements of thiamine, magnesium, folate, and B12 Continue follow-ups with gastroenterology she is now established with Dr. Silverio Decamp Follow-up in 1 week if edema is still present-would need to consider cardiology referral and echocardiogram.  Otherwise follow-up per her routine schedule for her chronic conditions.     Reviewed expectations re: course of current medical issues.  Discussed self-management of symptoms.  Outlined signs and symptoms indicating need for more acute intervention.  Patient verbalized understanding and all questions were  answered.  Patient received an After-Visit Summary.   Return in about 8 weeks (around 04/19/2020) for [facility] (  30 min).   No orders of the defined types were placed in this encounter.  Meds ordered this encounter  Medications  . thiamine 100 MG tablet    Sig: Take 1 tablet (100 mg total) by mouth daily.    Dispense:  90 tablet    Refill:  1  . folic acid (FOLVITE) 1 MG tablet    Sig: Take 1 tablet (1 mg total) by mouth daily.    Dispense:  90 tablet    Refill:  1  . furosemide (LASIX) 40 MG tablet    Sig: 1 tab twice a day for 1 day, then 1 tab QD    Dispense:  30 tablet    Refill:  0  . ondansetron (ZOFRAN) 4 MG tablet    Sig: Take 1 tablet (4 mg total) by mouth every 8 (eight) hours as needed for nausea or vomiting.    Dispense:  60 tablet    Refill:  2   Referral Orders  No referral(s) requested today    > 40 Minutes was dedicated to this patient's encounter to include pre-visit review of chart, face-to-face time with patient and post-visit work- which include documentation and prescribing medications and/or ordering test when necessary.    Note is dictated utilizing voice recognition software. Although note has been proof read prior to signing, occasional typographical errors still can be missed. If any questions arise, please do not hesitate to call for verification.   electronically signed by:  Howard Pouch, DO  [place]

## 2020-02-21 NOTE — Patient Instructions (Signed)
Start lasix today. Take tomorrow morning and tomorrow afternoon (2pm). If still swollen 5/27 you can one tab a day for 5 additional days if needed only.  If still swollen next week make an appt to follow up.    Alcoholic Liver Disease  Alcoholic liver disease is liver damage that is caused by drinking a lot of alcohol for a long time. If you have this disease, you must stop drinking alcohol. Follow these instructions at home:   Do not drink alcohol. Follow your treatment plan. Work with your doctor if you need help.  Think about joining an alcohol support group.  Take over-the-counter and prescription medicines only as told by your doctor. These include vitamins.  Do not use medicines or eat foods that have alcohol in them, unless your doctor says that it is safe.  Follow instructions from your doctor about eating a healthy diet.  Keep all follow-up visits as told by your doctor. This is important. Contact a doctor if:  You get a fever.  Your skin starts to look more yellow, pale, or dark.  You get headaches. Get help right away if:  You throw up (vomit) blood.  You have bright red blood in your poop (stool).  Your poop looks black or looks like tar.  You have trouble: ? Thinking. ? Walking. ? Balancing. ? Breathing. Summary  Alcoholic liver disease is liver damage that is caused by drinking a lot of alcohol for a long time.  If you have this disease, you must stop drinking alcohol. Follow your treatment plan, and work with your doctor as needed.  Think about joining an alcohol support group. This information is not intended to replace advice given to you by your health care provider. Make sure you discuss any questions you have with your health care provider. Document Revised: 01/04/2019 Document Reviewed: 06/05/2017 Elsevier Patient Education  [place].

## 2020-02-22 ENCOUNTER — Encounter: Payer: Self-pay | Admitting: Family Medicine

## 2020-02-22 DIAGNOSIS — F1020 Alcohol dependence, uncomplicated: Secondary | ICD-10-CM | POA: Insufficient documentation

## 2020-02-22 MED ORDER — ONDANSETRON HCL 4 MG PO TABS: 4.0000 mg | ORAL_TABLET | Freq: Three times a day (TID) | ORAL | 2 refills | Status: DC | PRN

## 2020-03-16 ENCOUNTER — Other Ambulatory Visit: Payer: Self-pay | Admitting: Family Medicine

## 2020-04-09 ENCOUNTER — Other Ambulatory Visit: Payer: Self-pay | Admitting: Family Medicine

## 2020-04-17 ENCOUNTER — Other Ambulatory Visit: Payer: Self-pay | Admitting: Family Medicine

## 2020-04-25 ENCOUNTER — Ambulatory Visit: Payer: 59 | Admitting: Family Medicine

## 2020-04-30 ENCOUNTER — Telehealth: Payer: Self-pay

## 2020-04-30 NOTE — Telephone Encounter (Signed)
Left message for patient to return call to office to schedule appointment with Dr Silverio Decamp.

## 2020-04-30 NOTE — Telephone Encounter (Signed)
-----   Message from Stevan Born, [place] sent at 02/17/2020  3:32 PM EDT ----- Regarding: 3 months Patient needs follow up in Aug with [facility], alcohol dependence

## 2020-05-01 ENCOUNTER — Other Ambulatory Visit: Payer: Self-pay

## 2020-05-01 MED ORDER — FUROSEMIDE 40 MG PO TABS: ORAL_TABLET | ORAL | 0 refills | Status: DC

## 2020-05-02 ENCOUNTER — Other Ambulatory Visit: Payer: Self-pay

## 2020-05-02 ENCOUNTER — Encounter: Payer: Self-pay | Admitting: Family Medicine

## 2020-05-02 ENCOUNTER — Ambulatory Visit (INDEPENDENT_AMBULATORY_CARE_PROVIDER_SITE_OTHER): Payer: 59 | Admitting: Family Medicine

## 2020-05-02 VITALS — BP 140/90 | HR 89 | Temp 98.1°F | Resp 18 | Ht 63.0 in | Wt 287.2 lb

## 2020-05-02 DIAGNOSIS — E66813 Obesity, class 3: Secondary | ICD-10-CM

## 2020-05-02 DIAGNOSIS — Z0001 Encounter for general adult medical examination with abnormal findings: Secondary | ICD-10-CM | POA: Diagnosis not present

## 2020-05-02 DIAGNOSIS — R7303 Prediabetes: Secondary | ICD-10-CM | POA: Diagnosis not present

## 2020-05-02 DIAGNOSIS — F319 Bipolar disorder, unspecified: Secondary | ICD-10-CM

## 2020-05-02 DIAGNOSIS — E519 Thiamine deficiency, unspecified: Secondary | ICD-10-CM

## 2020-05-02 DIAGNOSIS — R251 Tremor, unspecified: Secondary | ICD-10-CM

## 2020-05-02 DIAGNOSIS — F418 Other specified anxiety disorders: Secondary | ICD-10-CM

## 2020-05-02 DIAGNOSIS — R000 Tachycardia, unspecified: Secondary | ICD-10-CM

## 2020-05-02 DIAGNOSIS — R7989 Other specified abnormal findings of blood chemistry: Secondary | ICD-10-CM | POA: Diagnosis not present

## 2020-05-02 DIAGNOSIS — Z23 Encounter for immunization: Secondary | ICD-10-CM | POA: Diagnosis not present

## 2020-05-02 DIAGNOSIS — E538 Deficiency of other specified B group vitamins: Secondary | ICD-10-CM | POA: Diagnosis not present

## 2020-05-02 DIAGNOSIS — G4700 Insomnia, unspecified: Secondary | ICD-10-CM

## 2020-05-02 DIAGNOSIS — Z13220 Encounter for screening for lipoid disorders: Secondary | ICD-10-CM

## 2020-05-02 DIAGNOSIS — K7030 Alcoholic cirrhosis of liver without ascites: Secondary | ICD-10-CM

## 2020-05-02 DIAGNOSIS — Z131 Encounter for screening for diabetes mellitus: Secondary | ICD-10-CM

## 2020-05-02 DIAGNOSIS — F1011 Alcohol abuse, in remission: Secondary | ICD-10-CM

## 2020-05-02 DIAGNOSIS — F1020 Alcohol dependence, uncomplicated: Secondary | ICD-10-CM

## 2020-05-02 DIAGNOSIS — I10 Essential (primary) hypertension: Secondary | ICD-10-CM

## 2020-05-02 DIAGNOSIS — E559 Vitamin D deficiency, unspecified: Secondary | ICD-10-CM

## 2020-05-02 DIAGNOSIS — Z6841 Body Mass Index (BMI) 40.0 and over, adult: Secondary | ICD-10-CM

## 2020-05-02 DIAGNOSIS — E611 Iron deficiency: Secondary | ICD-10-CM | POA: Diagnosis not present

## 2020-05-02 LAB — COMPREHENSIVE METABOLIC PANEL
ALT: 102 U/L — ABNORMAL HIGH (ref 0–35)
AST: 90 U/L — ABNORMAL HIGH (ref 0–37)
Albumin: 4 g/dL (ref 3.5–5.2)
Alkaline Phosphatase: 138 U/L — ABNORMAL HIGH (ref 39–117)
BUN: 9 mg/dL (ref 6–23)
CO2: 29 mEq/L (ref 19–32)
Calcium: 9.9 mg/dL (ref 8.4–10.5)
Chloride: 98 mEq/L (ref 96–112)
Creatinine, Ser: 0.55 mg/dL (ref 0.40–1.20)
GFR: 128.05 mL/min (ref >60.00)
Glucose, Bld: 110 mg/dL — ABNORMAL HIGH (ref 70–99)
Potassium: 4.8 mEq/L (ref 3.5–5.1)
Sodium: 136 mEq/L (ref 135–145)
Total Bilirubin: 1.1 mg/dL (ref 0.2–1.2)
Total Protein: 7.6 g/dL (ref 6.0–8.3)

## 2020-05-02 LAB — LIPID PANEL
Cholesterol: 195 mg/dL (ref 0–200)
HDL: 40.5 mg/dL (ref >39.00)
LDL Cholesterol: 118 mg/dL — ABNORMAL HIGH (ref 0–99)
NonHDL: 154.85
Total CHOL/HDL Ratio: 5
Triglycerides: 182 mg/dL — ABNORMAL HIGH (ref 0.0–149.0)
VLDL: 36.4 mg/dL (ref 0.0–40.0)

## 2020-05-02 LAB — VITAMIN D 25 HYDROXY (VIT D DEFICIENCY, FRACTURES): VITD: 42.46 ng/mL (ref 30.00–100.00)

## 2020-05-02 LAB — B12 AND FOLATE PANEL
Folate: >24.8 ng/mL (ref >5.9)
Vitamin B-12: 568 pg/mL (ref 211–911)

## 2020-05-02 LAB — IRON: Iron: 81 ug/dL (ref 42–145)

## 2020-05-02 LAB — FERRITIN: Ferritin: 919.4 ng/mL — ABNORMAL HIGH (ref 10.0–291.0)

## 2020-05-02 MED ORDER — METOPROLOL SUCCINATE ER 50 MG PO TB24: 50.0000 mg | ORAL_TABLET | Freq: Every day | ORAL | 1 refills | Status: DC

## 2020-05-02 MED ORDER — DULOXETINE HCL 60 MG PO CPEP: 60.0000 mg | ORAL_CAPSULE | Freq: Every day | ORAL | 1 refills | Status: DC

## 2020-05-02 MED ORDER — GABAPENTIN 300 MG PO CAPS: 300.0000 mg | ORAL_CAPSULE | Freq: Three times a day (TID) | ORAL | 1 refills | Status: DC

## 2020-05-02 MED ORDER — FUROSEMIDE 40 MG PO TABS: ORAL_TABLET | ORAL | 2 refills | Status: DC

## 2020-05-02 MED ORDER — QUETIAPINE FUMARATE 300 MG PO TABS: 300.0000 mg | ORAL_TABLET | Freq: Every day | ORAL | 1 refills | Status: DC

## 2020-05-02 MED ORDER — OMEPRAZOLE 40 MG PO CPDR: 40.0000 mg | DELAYED_RELEASE_CAPSULE | Freq: Every day | ORAL | 3 refills | Status: DC

## 2020-05-02 NOTE — Patient Instructions (Addendum)
Vaccines today Hep A and pneumonia.  Hep A 2nd dose in 6 mos.  2 weeks after your 2nd covid series we can start the Hep B vaccines- which is a series of 3 shots. 2nd shot is 1 month after first and 3 rd shot is 6 months.    I have refilled your medications for you today.     Health Maintenance, Female Adopting a healthy lifestyle and getting preventive care are important in promoting health and wellness. Ask your health care provider about:  The right schedule for you to have regular tests and exams.  Things you can do on your own to prevent diseases and keep yourself healthy. What should I know about diet, weight, and exercise? Eat a healthy diet   Eat a diet that includes plenty of vegetables, fruits, low-fat dairy products, and lean protein.  Do not eat a lot of foods that are high in solid fats, added sugars, or sodium. Maintain a healthy weight Body mass index (BMI) is used to identify weight problems. It estimates body fat based on height and weight. Your health care provider can help determine your BMI and help you achieve or maintain a healthy weight. Get regular exercise Get regular exercise. This is one of the most important things you can do for your health. Most adults should:  Exercise for at least 150 minutes each week. The exercise should increase your heart rate and make you sweat (moderate-intensity exercise).  Do strengthening exercises at least twice a week. This is in addition to the moderate-intensity exercise.  Spend less time sitting. Even light physical activity can be beneficial. Watch cholesterol and blood lipids Have your blood tested for lipids and cholesterol at 32 years of age, then have this test every 5 years. Have your cholesterol levels checked more often if:  Your lipid or cholesterol levels are high.  You are older than 32 years of age.  You are at high risk for heart disease. What should I know about cancer screening? Depending on your  health history and family history, you may need to have cancer screening at various ages. This may include screening for:  Breast cancer.  Cervical cancer.  Colorectal cancer.  Skin cancer.  Lung cancer. What should I know about heart disease, diabetes, and high blood pressure? Blood pressure and heart disease  High blood pressure causes heart disease and increases the risk of stroke. This is more likely to develop in people who have high blood pressure readings, are of African descent, or are overweight.  Have your blood pressure checked: ? Every 3-5 years if you are 67-71 years of age. ? Every year if you are 38 years old or older. Diabetes Have regular diabetes screenings. This checks your fasting blood sugar level. Have the screening done:  Once every three years after age 52 if you are at a normal weight and have a low risk for diabetes.  More often and at a younger age if you are overweight or have a high risk for diabetes. What should I know about preventing infection? Hepatitis B If you have a higher risk for hepatitis B, you should be screened for this virus. Talk with your health care provider to find out if you are at risk for hepatitis B infection. Hepatitis C Testing is recommended for:  Everyone born from [birth year] through 1963.  Anyone with known risk factors for hepatitis C. Sexually transmitted infections (STIs)  Get screened for STIs, including gonorrhea and chlamydia, if: ?  You are sexually active and are younger than 33 years of age. ? You are older than 32 years of age and your health care provider tells you that you are at risk for this type of infection. ? Your sexual activity has changed since you were last screened, and you are at increased risk for chlamydia or gonorrhea. Ask your health care provider if you are at risk.  Ask your health care provider about whether you are at high risk for HIV. Your health care provider may recommend a prescription  medicine to help prevent HIV infection. If you choose to take medicine to prevent HIV, you should first get tested for HIV. You should then be tested every 3 months for as long as you are taking the medicine. Pregnancy  If you are about to stop having your period (premenopausal) and you may become pregnant, seek counseling before you get pregnant.  Take 400 to 800 micrograms (mcg) of folic acid every day if you become pregnant.  Ask for birth control (contraception) if you want to prevent pregnancy. Osteoporosis and menopause Osteoporosis is a disease in which the bones lose minerals and strength with aging. This can result in bone fractures. If you are 82 years old or older, or if you are at risk for osteoporosis and fractures, ask your health care provider if you should:  Be screened for bone loss.  Take a calcium or vitamin D supplement to lower your risk of fractures.  Be given hormone replacement therapy (HRT) to treat symptoms of menopause. Follow these instructions at home: Lifestyle  Do not use any products that contain nicotine or tobacco, such as cigarettes, e-cigarettes, and chewing tobacco. If you need help quitting, ask your health care provider.  Do not use street drugs.  Do not share needles.  Ask your health care provider for help if you need support or information about quitting drugs. Alcohol use  Do not drink alcohol if: ? Your health care provider tells you not to drink. ? You are pregnant, may be pregnant, or are planning to become pregnant.  If you drink alcohol: ? Limit how much you use to 0-1 drink a day. ? Limit intake if you are breastfeeding.  Be aware of how much alcohol is in your drink. In the [place] one drink equals one 12 oz bottle of beer (355 mL), one 5 oz glass of wine (148 mL), or one 1 oz glass of hard liquor (44 mL). General instructions  Schedule regular health, dental, and eye exams.  Stay current with your vaccines.  Tell your health  care provider if: ? You often feel depressed. ? You have ever been abused or do not feel safe at home. Summary  Adopting a healthy lifestyle and getting preventive care are important in promoting health and wellness.  Follow your health care provider's instructions about healthy diet, exercising, and getting tested or screened for diseases.  Follow your health care provider's instructions on monitoring your cholesterol and blood pressure. This information is not intended to replace advice given to you by your health care provider. Make sure you discuss any questions you have with your health care provider. Document Revised: 09/08/2018 Document Reviewed: 09/08/2018 Elsevier Patient Education  2020 [employer].

## 2020-05-02 NOTE — Telephone Encounter (Signed)
Left message for patient to return call to office to schedule appointment with Dr Silverio Decamp.

## 2020-05-02 NOTE — Progress Notes (Signed)
This visit occurred during the SARS-CoV-2 public health emergency.  Safety protocols were in place, including screening questions prior to the visit, additional usage of staff PPE, and extensive cleaning of exam room while observing appropriate contact time as indicated for disinfecting solutions.    Patient ID: Brandi Kirk, female  DOB: 1986-08-14, 34 y.o.   MRN: [id number] Patient Care Team    Relationship Specialty Notifications Start End  [name], DO PCP - General Family Medicine  03/15/18   [name], MD Consulting Physician Obstetrics and Gynecology  03/15/18   [name], MD Consulting Physician Gastroenterology  02/22/20     Chief Complaint  Patient presents with  . Annual Exam    Pt states fasting. Paps done at GYN    Subjective:  Brandi Kirk is a 34 y.o.  Female  present for CPE and chronic medical condition follow-up.. All past medical history, surgical history, allergies, family history, immunizations, medications and social history were updated in the electronic medical record today. All recent labs, ED visits and hospitalizations within the last year were reviewed.  See assessment and plan for chronic medical conditions problem base documentation.  Health maintenance:  Colonoscopy: no fhx, screen 47. established to GI for elevated lft- alcohol- cirrhosis.  Mammogram: no fhx, screen at 42 Cervical cancer screening: last pap: 2018, per pt completed by: Dr. Philis Pique Immunizations: tdap UTD 03/2018, Influenza  (encouraged yearly), encouraged hep b series with elevated lft/liver disease/alcoholoism will start after she has her covid series completed. PNA 23 completed today. Infectious disease screening: HIV completed 2018.  Hep C completed .  Assistive device: none Oxygen [id number] Patient has a Dental home. Hospitalizations/ED visits: reviewed-recent admission for alcohol withdrawal 04/19/2020   Depression screen PHQ 2/9 05/02/2020  Decreased Interest  1  Down, Depressed, Hopeless 1  PHQ - 2 Score 2  Altered sleeping 2  Tired, decreased energy 3  Change in appetite 3  Feeling bad or failure about yourself  1  Trouble concentrating 0  Moving slowly or fidgety/restless 1  Suicidal thoughts 0  PHQ-9 Score 12  Difficult doing work/chores Somewhat difficult  Some encounter information is confidential and restricted. Go to Review Flowsheets activity to see all data.   No flowsheet data found.   Immunization History  Administered Date(s) Administered  . Hepatitis A, Adult 05/02/2020  . Hepatitis B, adult 04/20/2018  . Influenza,inj,Quad PF,6+ Mos 11/26/2016, 06/01/2019  . Pneumococcal Polysaccharide-23 05/02/2020  . Tdap 04/15/2018     Past Medical History:  Diagnosis Date  . Alcohol addiction ([place])   . Alcoholic intoxication with complication ([place]) 0/17/7939  . Anxiety   . Back pain 03/18/2016  . Bipolar 1 disorder ([place])   . Blood in stool 07/2017   internal hemorrhoid   . Chicken pox   . 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 Depression   . Fractured rib 2019  . Frequent headaches   . Frequent UTI   . GERD (gastroesophageal reflux disease)   . HPV in female   . Hypertension   . Hypomagnesemia 11/17/2017  . Migraine   . PCOS (polycystic ovarian syndrome)   . Prediabetes   . Vocal cord dysfunction    Allergies  Allergen Reactions  . Ciprofloxacin Other (See Comments)    intolerance  . Septra [Sulfamethoxazole-Trimethoprim] Other (See Comments)    Thrush   . Tylenol [Acetaminophen] Other (See Comments)    Patient stated she can't take it due to liver   Past Surgical History:  Procedure Laterality Date  . WISDOM TOOTH EXTRACTION     Family History  Problem Relation Age of Onset  . Diabetes Mother   . Stroke Mother 37  . Hypertension Mother   . Heart murmur Mother   . Alcohol abuse Mother   . Arthritis Mother   . Depression Mother   . Early death Mother   . Hyperlipidemia Mother   . Kidney disease  Mother   . Mental illness Mother   . Epilepsy Mother   . Rheum arthritis Sister   . Heart murmur Sister   . Alcohol abuse Sister   . Bipolar disorder Sister   . Cirrhosis Sister   . Heart murmur Brother   . Alcohol abuse Brother   . Depression Brother   . Mental illness Brother        bipolar  . Alcohol abuse Father   . Depression Father   . Mental illness Father   . Arthritis Maternal Grandmother   . Birth defects Maternal Grandmother   . Hyperlipidemia Maternal Grandmother   . Hyperlipidemia Maternal Grandfather   . Alcohol abuse Maternal Grandfather   . Arthritis Maternal Grandfather   . Diabetes Maternal Grandfather   . Stroke Maternal Grandfather   . Arthritis Paternal Grandmother   . Arthritis Paternal Grandfather   . Birth defects Brother   . Mental illness Brother        bipolar  . Depression Brother   . Mental illness Brother    Social History   Social History Narrative   Married. 1 child.    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    Alcohol abuse. Smoker.   Smoke alarm in the home   feels safe in her relationships.     Allergies as of 05/02/2020      Reactions   Ciprofloxacin Other (See Comments)   intolerance   Septra [sulfamethoxazole-trimethoprim] Other (See Comments)   Thrush   Tylenol [acetaminophen] Other (See Comments)   Patient stated she can't take it due to liver      Medication List       Accurate as of May 02, 2020 11:59 PM. If you have any questions, ask your nurse or doctor.        STOP taking these medications   naltrexone 50 MG tablet Commonly known as: DEPADE Stopped by: Howard Pouch, DO   ondansetron 4 MG tablet Commonly known as: ZOFRAN Stopped by: Howard Pouch, DO     TAKE these medications   Cholecalciferol 25 MCG (1000 UT) tablet Take 1 tablet (1,000 Units total) by mouth daily.   DULoxetine 60 MG capsule Commonly known as: CYMBALTA Take 1 capsule (60 mg total) by mouth daily.   folic acid 1 MG  tablet Commonly known as: FOLVITE Take 1 tablet (1 mg total) by mouth daily.   furosemide 40 MG tablet Commonly known as: LASIX TAKE 1 TABLET BY MOUTH EVERY DAY   gabapentin 300 MG capsule Commonly known as: NEURONTIN Take 1 capsule (300 mg total) by mouth 3 (three) times daily.   levonorgestrel 20 MCG/24HR IUD Commonly known as: MIRENA 1 each by Intrauterine route continuous.   magnesium chloride 64 MG Tbec SR tablet Commonly known as: SLOW-MAG Take 2 tablets (128 mg total) by mouth daily.   metoprolol succinate 50 MG 24 hr tablet Commonly known as: TOPROL-XL Take 1 tablet (50 mg total) by mouth daily. Take with or immediately following a meal.   multivitamin tablet Take 1 tablet by mouth  daily.   omeprazole 40 MG capsule Commonly known as: PRILOSEC Take 1 capsule (40 mg total) by mouth daily.   QUEtiapine 300 MG tablet Commonly known as: SEROQUEL Take 1 tablet (300 mg total) by mouth at bedtime.   thiamine 100 MG tablet Take 1 tablet (100 mg total) by mouth daily.       All past medical history, surgical history, allergies, family history, immunizations andmedications were updated in the EMR today and reviewed under the history and medication portions of their EMR.      ROS: 14 pt review of systems performed and negative (unless mentioned in an HPI)  Objective: BP 140/90 (BP Location: Left Arm, Patient Position: Sitting, Cuff Size: Large)   Pulse 89   Temp 98.1 F (36.7 C) (Oral)   Resp 18   Ht 5' 3"  (1.6 m)   Wt 287 lb 3.2 oz (130.3 kg)   SpO2 95%   BMI 50.88 kg/m  Gen: Afebrile. No acute distress. Nontoxic in appearance, well-developed, well-nourished, obese Caucasian female mild slurring of speech today. HENT: AT. [place]. Bilateral TM visualized and normal in appearance, normal external auditory canal. MMM, no oral lesions, adequate dentition. Bilateral nares within normal limits. Throat without erythema, ulcerations or exudates.  No cough on exam, no  hoarseness on exam. Eyes:Pupils Equal Round Reactive to light, Extraocular movements intact,  Conjunctiva without redness, discharge or icterus. Neck/lymp/endocrine: Supple, no lymphadenopathy, no thyromegaly CV: RRR no murmur, trace edema, +2/4 P posterior tibialis pulses.  Chest: CTAB, no wheeze, rhonchi or crackles.  Normal respiratory effort.  Good air movement. Abd: Soft.  Obese. NTND. BS present.  No masses palpated. No hepatosplenomegaly. No rebound tenderness or guarding. Skin: No rashes, purpura or petechiae. Warm and well-perfused. Skin intact. Neuro/Msk:  Normal gait. PERLA. EOMi. Alert. Oriented x3.  Cranial nerves II through XII intact. Muscle strength 5/5 upper/lower extremity. DTRs equal bilaterally. Psych: Normal affect, dress and demeanor. Normal speech.   No exam data present  Assessment/plan: Devita Nies is a 34 y.o. female present for CPE and CMC Alcohol use disorder/Alcoholic cirrhosis, unspecified whether ascites present (HCC)/lower extremity edema/depression with anxiety/insomnia/bipolar disorder Continue Lasix as needed.  She has not needed in some time Patient and her husband was educated today on alcoholism and effects on the heart, liver and blood cell counts.  She was encouraged again to stop using alcohol.  Restarted recently after the death of a family member. Patient was counseled on vaccines today hep A started today.  Hep B series to start after her Covid series.  Pneumovax completed today. She has been offered multiple resources to help her with her journey of sobriety.   She has declined rehab admission.   She has declined outpatient rehabilitation services.   She has declined psychiatric referral. She does not want to continue naltrexone.  She states it does not help her and she is drinking despite use. Continue gabapentin 3 times daily Continue Seroquel 300 mg nightly Continue supplements of thiamine, magnesium, folate, and B12 Continue follow-ups with  gastroenterology she is now established with Dr. Silverio Decamp - Hepatitis A vaccine adult IM - Pneumococcal polysaccharide vaccine 23-valent greater than or equal to 2yo subcutaneous/IM  Prediabetes A1c reviewed and recent admission  Class 3 severe obesity due to excess calories with body mass index (BMI) of 40.0 to 44.9 in adult, unspecified whether serious comorbidity present (HCC) Alcohol cessation. Exercise and dietary changes recommended  Tachycardia/essential hypertension/tremor Continue metoprolol 50 mg daily. Vitamin B1 deficiency - Vitamin B1  Folate deficiency - B12 and Folate Panel Vitamin D deficiency - Vitamin D (25 hydroxy) Elevated LFTs - Comp Met (CMET) Lipid screening - Lipid panel Iron deficiency - Iron - Ferritin Encounter for preventative exam with abnormal findings Patient was encouraged to exercise greater than 150 minutes a week. Patient was encouraged to choose a diet filled with fresh fruits and vegetables, and lean meats. AVS provided to patient today for education/recommendation on gender specific health and safety maintenance. Colonoscopy: no fhx, screen 47. established to GI for elevated lft- alcohol- cirrhosis.  Mammogram: no fhx, screen at 42 Cervical cancer screening: last pap: 2018, per pt completed by: Dr. Philis Pique Immunizations: tdap UTD 03/2018, Influenza  (encouraged yearly), encouraged hep b series with elevated lft/liver disease/alcoholoism will start after she has her covid series completed. PNA 23 completed today. Infectious disease screening: HIV completed 2018.  Hep C completed .   Complete physical was completed today in addition to greater than 30 minutes spent on patient's chronic medical conditions covering precharting, review of recent hospital admission and laboratory results, ordering lab tests, ordering screening tests, ordering medications.  Follow-ups: Hepatitis A vaccine second dose by nurse appointment in 6 months. Start hepatitis  B series by nurse appointment at least 2 weeks after second Covid vaccine.  Hepatitis B series timing 0, 1 month, 6 months injections. Return in about 6 months (around 10/22/2020) for [place] (30 min) and 1 year for physical.  Orders Placed This Encounter  Procedures  . Hepatitis A vaccine adult IM  . Pneumococcal polysaccharide vaccine 23-valent greater than or equal to 2yo subcutaneous/IM  . Lipid panel  . Vitamin B1  . B12 and Folate Panel  . Comp Met (CMET)  . Vitamin D (25 hydroxy)  . Iron  . Ferritin    Meds ordered this encounter  Medications  . DULoxetine (CYMBALTA) 60 MG capsule    Sig: Take 1 capsule (60 mg total) by mouth daily.    Dispense:  90 capsule    Refill:  1  . furosemide (LASIX) 40 MG tablet    Sig: TAKE 1 TABLET BY MOUTH EVERY DAY    Dispense:  15 tablet    Refill:  2    Hold until pt request  . gabapentin (NEURONTIN) 300 MG capsule    Sig: Take 1 capsule (300 mg total) by mouth 3 (three) times daily.    Dispense:  270 capsule    Refill:  1    DC prior script  . metoprolol succinate (TOPROL-XL) 50 MG 24 hr tablet    Sig: Take 1 tablet (50 mg total) by mouth daily. Take with or immediately following a meal.    Dispense:  90 tablet    Refill:  1  . omeprazole (PRILOSEC) 40 MG capsule    Sig: Take 1 capsule (40 mg total) by mouth daily.    Dispense:  90 capsule    Refill:  3  . QUEtiapine (SEROQUEL) 300 MG tablet    Sig: Take 1 tablet (300 mg total) by mouth at bedtime.    Dispense:  90 tablet    Refill:  1    Sig change. DC prior script. She wants to pick up today  . thiamine 100 MG tablet    Sig: Take 1 tablet (100 mg total) by mouth daily.    Dispense:  90 tablet    Refill:  1  . magnesium chloride (SLOW-MAG) 64 MG TBEC SR tablet    Sig: Take 2 tablets (128  mg total) by mouth daily.    Dispense:  180 tablet    Refill:  3  . folic acid (FOLVITE) 1 MG tablet    Sig: Take 1 tablet (1 mg total) by mouth daily.    Dispense:  90 tablet    Refill:  1    . Cholecalciferol 25 MCG (1000 UT) tablet    Sig: Take 1 tablet (1,000 Units total) by mouth daily.    Dispense:  90 tablet    Refill:  3   Referral Orders  No referral(s) requested today     Electronically signed by: Howard Pouch, [place]

## 2020-05-03 ENCOUNTER — Telehealth: Payer: Self-pay | Admitting: Family Medicine

## 2020-05-03 MED ORDER — THIAMINE HCL 100 MG PO TABS: 100.0000 mg | ORAL_TABLET | Freq: Every day | ORAL | 1 refills | Status: DC

## 2020-05-03 MED ORDER — FOLIC ACID 1 MG PO TABS: 1.0000 mg | ORAL_TABLET | Freq: Every day | ORAL | 1 refills | Status: DC

## 2020-05-03 MED ORDER — CHOLECALCIFEROL 25 MCG (1000 UT) PO TABS: 1000.0000 [IU] | ORAL_TABLET | Freq: Every day | ORAL | 3 refills | Status: DC

## 2020-05-03 MED ORDER — MAGNESIUM CHLORIDE 64 MG PO TBEC: 2.0000 | DELAYED_RELEASE_TABLET | Freq: Every day | ORAL | 3 refills | Status: DC

## 2020-05-03 NOTE — Telephone Encounter (Signed)
Please call patient Her liver enzymes are elevated -mildly more so since her hospitalization discharge a few days ago. AST of 90 and an ALT of 102. Kidney function is normal Electrolytes are normal, with the exception of a mildly elevated fasting glucose of 110. Cholesterol panel overall meets goal with mildly elevated triglycerides of 182 (normal less than 150) Folate, vitamin D and B12 levels are all in normal range. Iron levels are normal but her ferritin is extremely high at 919. The elevated ferritin levels are from her alcohol consumption and liver damage. Would encourage her to continue to cut back on alcohol consumption and try to quit drinking alcohol altogether. This would help improve her glucose levels and her kidney function. Continue to supplement B vitamins, vitamin D and magnesium.  I would encourage her to follow-up with her gastroenterologist as soon as possible concerning the liver enzymes and extremely elevated ferritin.  I have completed her physician wellness screening result form and return to nurses station just to finish completion.

## 2020-05-04 NOTE — Telephone Encounter (Signed)
Pt notified of lab results and voiced understanding. She also stated she has not has an alcoholic drink in 11 days.  Please call patient when form is complete.

## 2020-05-06 LAB — VITAMIN B1: Vitamin B1 (Thiamine): 32 nmol/L — ABNORMAL HIGH (ref 8–30)

## 2020-05-07 NOTE — Telephone Encounter (Signed)
Form was not found in nurses station anywhere.

## 2020-05-10 NOTE — Telephone Encounter (Signed)
Dr. Raoul Pitch, where did you place this form at in the nurses station?

## 2020-05-10 NOTE — Telephone Encounter (Signed)
All forms are returned to My CMA/nurse work station

## 2020-05-14 ENCOUNTER — Other Ambulatory Visit: Payer: Self-pay | Admitting: Family Medicine

## 2020-05-14 NOTE — Telephone Encounter (Signed)
Left message for patient to return call to office to schedule appointment with Dr Silverio Decamp.

## 2020-05-28 ENCOUNTER — Other Ambulatory Visit: Payer: Self-pay

## 2020-06-07 ENCOUNTER — Encounter: Payer: Self-pay | Admitting: Family Medicine

## 2020-06-08 NOTE — Telephone Encounter (Signed)
Husband dropped off paper for wellness health check screening. The other was "misplaced".  I told him that Dr. Raoul Pitch was not in office today and would be returning on Monday to sign off on form.  I apologized to him.  Form is in [employer] mail/fax folder in front office. See Hinton Dyer if questions.  Please fill out form and have Dr. Raoul Pitch sign off.    Please call when completed. [phone]  Thank you.

## 2020-06-11 NOTE — Telephone Encounter (Signed)
Paperwork completed, copied, sent to scan and original placed in front for pick up

## 2020-06-22 ENCOUNTER — Ambulatory Visit: Payer: 59 | Admitting: Family Medicine

## 2020-10-26 ENCOUNTER — Other Ambulatory Visit: Payer: Self-pay | Admitting: Family Medicine

## 2020-10-26 DIAGNOSIS — F418 Other specified anxiety disorders: Secondary | ICD-10-CM

## 2020-10-26 DIAGNOSIS — F1011 Alcohol abuse, in remission: Secondary | ICD-10-CM

## 2020-11-07 ENCOUNTER — Other Ambulatory Visit: Payer: Self-pay

## 2020-11-07 ENCOUNTER — Ambulatory Visit (INDEPENDENT_AMBULATORY_CARE_PROVIDER_SITE_OTHER): Payer: 59 | Admitting: Family Medicine

## 2020-11-07 ENCOUNTER — Encounter: Payer: Self-pay | Admitting: Family Medicine

## 2020-11-07 VITALS — BP 130/89 | HR 112 | Temp 98.1°F | Resp 18 | Wt 323.6 lb

## 2020-11-07 DIAGNOSIS — F418 Other specified anxiety disorders: Secondary | ICD-10-CM | POA: Diagnosis not present

## 2020-11-07 DIAGNOSIS — K7030 Alcoholic cirrhosis of liver without ascites: Secondary | ICD-10-CM | POA: Diagnosis not present

## 2020-11-07 DIAGNOSIS — F1010 Alcohol abuse, uncomplicated: Secondary | ICD-10-CM | POA: Diagnosis not present

## 2020-11-07 DIAGNOSIS — F1910 Other psychoactive substance abuse, uncomplicated: Secondary | ICD-10-CM | POA: Diagnosis not present

## 2020-11-07 DIAGNOSIS — F319 Bipolar disorder, unspecified: Secondary | ICD-10-CM

## 2020-11-07 NOTE — Progress Notes (Signed)
This visit occurred during the SARS-CoV-2 public health emergency.  Safety protocols were in place, including screening questions prior to the visit, additional usage of staff PPE, and extensive cleaning of exam room while observing appropriate contact time as indicated for disinfecting solutions.    Brandi Kirk , [birth year], 35 y.o., female MRN: [id number] Patient Care Team    Relationship Specialty Notifications Start End  [name], DO PCP - General Family Medicine  03/15/18   [name], MD Consulting Physician Obstetrics and Gynecology  03/15/18   [name], MD Consulting Physician Gastroenterology  02/22/20     Chief Complaint  Patient presents with  . water retention    And SOB  . Lip Laceration    Present before thanksgiving- has tried multiple topicals.     Subjective: Pt presents for an OV with complaints of fluid retention of > 2 weeks  duration.  She has had a 36 lb weight gain since her last visit. Her husband states she has been taking her vitamins, medications and lasix. Kaisy admits she has been drinking a great deal daily since she was last seen. She has been drinking alcohol  Today. She reports shortness of breath and extreme fatigue. She denies chest pain.   Depression screen PHQ 2/9 05/02/2020  Decreased Interest 1  Down, Depressed, Hopeless 1  PHQ - 2 Score 2  Altered sleeping 2  Tired, decreased energy 3  Change in appetite 3  Feeling bad or failure about yourself  1  Trouble concentrating 0  Moving slowly or fidgety/restless 1  Suicidal thoughts 0  PHQ-9 Score 12  Difficult doing work/chores Somewhat difficult  Some encounter information is confidential and restricted. Go to Review Flowsheets activity to see all data.    Allergies  Allergen Reactions  . Ciprofloxacin Other (See Comments)    intolerance  . Septra [Sulfamethoxazole-Trimethoprim] Other (See Comments)    Thrush   . Tylenol [Acetaminophen] Other (See Comments)     Patient stated she can't take it due to liver   Social History   Social History Narrative   Married. 1 child.    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    Alcohol abuse. Smoker.   Smoke alarm in the home   feels safe in her relationships.    Past Medical History:  Diagnosis Date  . Alcohol addiction ([place])   . Alcoholic intoxication with complication ([place]) 0/81/4481  . Anxiety   . Back pain 03/18/2016  . Bipolar 1 disorder ([place])   . Blood in stool 07/2017   internal hemorrhoid   . Chicken pox   . 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 Depression   . Fractured rib 2019  . Frequent headaches   . Frequent UTI   . GERD (gastroesophageal reflux disease)   . HPV in female   . Hypertension   . Hypomagnesemia 11/17/2017  . Migraine   . PCOS (polycystic ovarian syndrome)   . Prediabetes   . Vocal cord dysfunction    Past Surgical History:  Procedure Laterality Date  . WISDOM TOOTH EXTRACTION     Family History  Problem Relation Age of Onset  . Diabetes Mother   . Stroke Mother 42  . Hypertension Mother   . Heart murmur Mother   . Alcohol abuse Mother   . Arthritis Mother   . Depression Mother   . Early death Mother   . Hyperlipidemia Mother   . Kidney disease Mother   .  Mental illness Mother   . Epilepsy Mother   . Rheum arthritis Sister   . Heart murmur Sister   . Alcohol abuse Sister   . Bipolar disorder Sister   . Cirrhosis Sister   . Heart murmur Brother   . Alcohol abuse Brother   . Depression Brother   . Mental illness Brother        bipolar  . Alcohol abuse Father   . Depression Father   . Mental illness Father   . Arthritis Maternal Grandmother   . Birth defects Maternal Grandmother   . Hyperlipidemia Maternal Grandmother   . Hyperlipidemia Maternal Grandfather   . Alcohol abuse Maternal Grandfather   . Arthritis Maternal Grandfather   . Diabetes Maternal Grandfather   . Stroke Maternal Grandfather   . Arthritis Paternal Grandmother   . Arthritis  Paternal Grandfather   . Birth defects Brother   . Mental illness Brother        bipolar  . Depression Brother   . Mental illness Brother    Allergies as of 11/07/2020      Reactions   Ciprofloxacin Other (See Comments)   intolerance   Septra [sulfamethoxazole-trimethoprim] Other (See Comments)   Thrush   Tylenol [acetaminophen] Other (See Comments)   Patient stated she can't take it due to liver      Medication List       Accurate as of November 07, 2020  2:41 PM. If you have any questions, ask your nurse or doctor.        Cholecalciferol 25 MCG (1000 UT) tablet Take 1 tablet (1,000 Units total) by mouth daily.   DULoxetine 60 MG capsule Commonly known as: CYMBALTA Take 1 capsule (60 mg total) by mouth daily.   folic acid 1 MG tablet Commonly known as: FOLVITE Take 1 tablet (1 mg total) by mouth daily.   furosemide 40 MG tablet Commonly known as: LASIX TAKE 1 TABLET BY MOUTH EVERY DAY   gabapentin 300 MG capsule Commonly known as: NEURONTIN TAKE 1 CAPSULE BY MOUTH THREE TIMES A DAY   levonorgestrel 20 MCG/24HR IUD Commonly known as: MIRENA 1 each by Intrauterine route continuous.   magnesium chloride 64 MG Tbec SR tablet Commonly known as: SLOW-MAG Take 2 tablets (128 mg total) by mouth daily.   metoprolol succinate 50 MG 24 hr tablet Commonly known as: TOPROL-XL Take 1 tablet (50 mg total) by mouth daily. Take with or immediately following a meal.   multivitamin tablet Take 1 tablet by mouth daily.   omeprazole 40 MG capsule Commonly known as: PRILOSEC Take 1 capsule (40 mg total) by mouth daily.   QUEtiapine 300 MG tablet Commonly known as: SEROQUEL TAKE 1 TABLET BY MOUTH EVERYDAY AT BEDTIME   thiamine 100 MG tablet Take 1 tablet (100 mg total) by mouth daily.       All past medical history, surgical history, allergies, family history, immunizations andmedications were updated in the EMR today and reviewed under the history and medication  portions of their EMR.     ROS: Negative, with the exception of above mentioned in HPI   Objective:  BP 130/89 (BP Location: Left Arm, Patient Position: Sitting, Cuff Size: Large)   Pulse (!) 112   Temp 98.1 F (36.7 C) (Oral)   Resp 18   Wt (!) 323 lb 9.6 oz (146.8 kg)   SpO2 91%   BMI 57.32 kg/m  Body mass index is 57.32 kg/m. Gen: Afebrile. No acute distress. intoxicated.  HENT: AT. Lip laceration. Fluid retention in face and lips.  Eyes:Pupils Equal Round Reactive to light, Extraocular movements intact,  Conjunctiva without redness, discharge or icterus. UG:[id number]. +2 fluid LE Chest: bilateral crackles. normal resp effort.  Neuro: unable to ambulate- wheelchair. PERLA. EOMi. Alert. Oriented x3 Psych: Tearful. Intoxicated. Unkempt. Slurred speech  No exam data present No results found. No results found for this or any previous visit (from the past 24 hour(s)).  Assessment/Plan: Reigna Ruperto is a 35 y.o. female present for OV for  Cirrhosis with alcoholism (HCC)/Alcohol abuse/Depression with anxiety/Polysubstance abuse (HCC)/Bipolar 1 disorder ([place]) Tom has had a long struggle with alcohol abuse. I am concerned she is in heart failure d/t her alcohol use. Weight gain of 36 lbs, last time this occurred she lost 15 lbs in fluid. Oxygen levels are low,  about 90% here (88-91%).  I have recommend she be taken to the ED for evaluation, labs, imaging, detox and cardiac work up. If she goes back home today she will continue to drink and cause herself more harm.  Husband agrees and will take her to the ED now.  I have recommended she be placed in inpatient treatment center for alcohol abuse.   Reviewed expectations re: course of current medical issues.  Discussed self-management of symptoms.  Outlined signs and symptoms indicating need for more acute intervention.  Patient verbalized understanding and all questions were answered.  Patient received an After-Visit  Summary.    No orders of the defined types were placed in this encounter.  No orders of the defined types were placed in this encounter.  Referral Orders  No referral(s) requested today    > 30 Minutes was dedicated to this patient's encounter to include pre-visit review of chart, face-to-face time with patient and post-visit work- which include documentation and prescribing medications and/or ordering test when necessary.    Note is dictated utilizing voice recognition software. Although note has been proof read prior to signing, occasional typographical errors still can be missed. If any questions arise, please do not hesitate to call for verification.   electronically signed by:  Howard Pouch, DO  [facility]

## 2020-11-07 NOTE — Patient Instructions (Signed)
I do recommend you go to ED now for evaluation and detox.  I strongly encouraged inpatient assistance for help.     Cardiomyopathy, Adult  Cardiomyopathy is a long-term (chronic) disease of the heart muscle. The disease makes the heart muscle thick, weak, or stiff. As a result, the heart works harder to pump blood. Over time, cardiomyopathy can lead to an irregular heartbeat (arrhythmia) and a condition in which the heart has trouble pumping blood (heart failure). There are several types of cardiomyopathy. The kind of cardiomyopathy that you have depends on what part of the heart is affected and how it is affected. What are the causes? This condition may be caused by:  A medical condition that damages the heart, such as diabetes, high blood pressure, or heart attack.  Diseases of the immune system, connective tissues, or endocrine system.  Alcohol abuse, use of illegal drugs, and taking certain medicines.  Pregnancy.  Too much iron in the body (hemochromatosis).  Cancer treatments.  Protein buildup in the organs or inflammation in the organs. In some cases, the cause is not known. What increases the risk? You are more likely to develop this condition if:  You have a gene that is passed down (inherited) from a family member.  You are overweight or obese. What are the signs or symptoms? Often, people with this condition have no symptoms. If you do have symptoms, this may include:  Shortness of breath, especially during activity.  Fatigue.  An arrhythmia.  Feeling dizzy or light-headed, or fainting.  Chest pain.  Coughing.  Swelling of the lower legs, feet, abdomen, or neck veins. How is this diagnosed? This condition is diagnosed based on:  Your symptoms and medical history.  A physical exam.  Blood tests and imaging studies, such as X-ray, echocardiogram, or MRI.  Other tests, such as: ? An electrocardiogram (ECG). ? A portable heart monitor that records your  heart's electrical activity (event monitor). ? A stress test. ? Cardiac catheterization and coronary angiogram. ? Heart tissue biopsy. ? An MRI of the heart. How is this treated? Your treatment depends on the type and severity of your symptoms. If you do not have symptoms, you may not need treatment. Treatment may include:  General healthy lifestyle changes.  Medicines to: ? Treat high blood pressure, abnormal heart rate, or inflammation. ? Clear excess fluids from your body. ? Prevent blood clots. ? Balance minerals (electrolytes) in your body and get rid of extra sodium in your body. ? Strengthen your heartbeat.  Surgery to: ? Repair a heart defect, remove heart tissue, or destroy tissues in the area of abnormal electrical activity (ablation). ? Implant a device to treat serious heart rhythm problems or to restore the heart's ability to pump blood, such as a pacemaker or a defibrillator. ? Replace your heart with a healthy heart. This is done if all other treatments have failed. Other treatment may include cardiac resynchronization therapy (CRT). This restores the heart's normal beating pattern. Follow these instructions at home: Medicines  Take over-the-counter and prescription medicines only as told by your health care provider. Some medicines can be dangerous for your heart.  If you are prescribed a blood thinner, make sure you understand what to do in a bleeding situation.  Tell all health care providers, including your dentist, that you have cardiomyopathy. Ask your health care provider if you need antibiotics before having dental care or before surgery. Lifestyle  Eat a heart-healthy diet that includes plenty of fruits, vegetables, whole  grains, and foods that are low in salt (sodium).  Maintain a healthy weight.  Stay physically active. Ask your health care provider what activities are safe for you.  Do not use any products that contain nicotine or tobacco, such as  cigarettes, e-cigarettes, and chewing tobacco. If you need help quitting, ask your health care provider.  Try to get at least 7 hours of sleep each night.  Find healthy ways to manage stress.   Alcohol use  Do not drink alcohol if: ? Your health care provider tells you not to drink. ? You are pregnant, may be pregnant, or are planning to become pregnant. If you drink alcohol:  Limit how much you use to: ? 0-1 drink a day for women. ? 0-2 drinks a day for men.  Be aware of how much alcohol is in your drink. In the [place] one drink equals one 12 oz bottle of beer (355 mL), one 5 oz glass of wine (148 mL), or one 1 oz glass of hard liquor (44 mL). General instructions  Ask your health care provider if you should wear a medical identification bracelet. This may be important if you have a pacemaker or a defibrillator.  Get all needed vaccines. Get a flu shot every year.  Work closely with your health care provider to manage any other chronic conditions.  If you plan to start a family, talk with a genetic counselor to discuss the risk of having a child with cardiomyopathy.  Keep all follow-up visits as told by your health care provider. This is important, even if you do not have any symptoms. Your health care provider may need to make sure your condition is not getting worse. Where to find more information  American Heart Association: www.heart.org Contact a health care provider if:  Your symptoms get worse.  You have new symptoms. Get help right away if you:  Have severe chest pain.  Have shortness of breath.  Cough up a pink, bubbly substance.  Feel nauseous and you vomit.  Suddenly become light-headed or dizzy.  Feel your heart beating very quickly.  Feel like your heart is skipping beats. These symptoms may represent a serious problem that is an emergency. Do not wait to see if the symptoms will go away. Get medical help right away. Call your local emergency services  (911 in the [place] Do not drive yourself to the hospital. Summary  Cardiomyopathy is a long-term (chronic) disease of the heart muscle.  Over time, cardiomyopathy can lead to an irregular heartbeat (arrhythmia) and heart failure.  A number of treatments are available for this condition. Your treatment will depend on the type and severity of your symptoms.  Follow your health care provider's instructions on diet, medicines, physical activity, and when to seek help. This information is not intended to replace advice given to you by your health care provider. Make sure you discuss any questions you have with your health care provider. Document Revised: 06/22/2019 Document Reviewed: 06/22/2019 Elsevier Patient Education  [place].

## 2021-01-12 ENCOUNTER — Other Ambulatory Visit: Payer: Self-pay | Admitting: Family Medicine

## 2021-02-17 ENCOUNTER — Other Ambulatory Visit: Payer: Self-pay | Admitting: Family Medicine

## 2021-03-05 DIAGNOSIS — D696 Thrombocytopenia, unspecified: Secondary | ICD-10-CM | POA: Insufficient documentation

## 2021-03-27 DIAGNOSIS — D729 Disorder of white blood cells, unspecified: Secondary | ICD-10-CM | POA: Insufficient documentation

## 2021-03-27 DIAGNOSIS — R601 Generalized edema: Secondary | ICD-10-CM | POA: Insufficient documentation

## 2021-03-27 DIAGNOSIS — D72828 Other elevated white blood cell count: Secondary | ICD-10-CM

## 2021-03-27 DIAGNOSIS — G4733 Obstructive sleep apnea (adult) (pediatric): Secondary | ICD-10-CM | POA: Insufficient documentation

## 2021-03-27 DIAGNOSIS — K7011 Alcoholic hepatitis with ascites: Secondary | ICD-10-CM | POA: Insufficient documentation

## 2021-03-27 DIAGNOSIS — J690 Pneumonitis due to inhalation of food and vomit: Secondary | ICD-10-CM

## 2021-03-27 HISTORY — DX: Other elevated white blood cell count: D72.828

## 2021-03-27 HISTORY — DX: Pneumonitis due to inhalation of food and vomit: J69.0

## 2021-03-27 HISTORY — DX: Disorder of white blood cells, unspecified: D72.9

## 2021-03-29 ENCOUNTER — Telehealth: Payer: Self-pay

## 2021-03-29 NOTE — Telephone Encounter (Signed)
Brandi Kirk with hospice care called to find out if PCP will be the hospice attending provider.  Brandi Kirk can be reached at [phone]

## 2021-03-29 NOTE — Telephone Encounter (Signed)
Please have them assign a hospice attending for patient. Thank you.  Of note: Our office was not aware she was on hospice-last seen 11/07/2020

## 2021-03-29 NOTE — Telephone Encounter (Signed)
Spoke with Tammy regarding recommendations to assign hospice attending.

## 2021-04-22 ENCOUNTER — Other Ambulatory Visit: Payer: Self-pay | Admitting: Family Medicine

## 2021-04-22 ENCOUNTER — Other Ambulatory Visit: Payer: Self-pay

## 2021-04-22 DIAGNOSIS — I10 Essential (primary) hypertension: Secondary | ICD-10-CM

## 2021-04-22 DIAGNOSIS — R000 Tachycardia, unspecified: Secondary | ICD-10-CM

## 2021-04-22 MED ORDER — METOPROLOL SUCCINATE ER 50 MG PO TB24: 50.0000 mg | ORAL_TABLET | Freq: Every day | ORAL | 0 refills | Status: DC

## 2021-05-01 ENCOUNTER — Ambulatory Visit (INDEPENDENT_AMBULATORY_CARE_PROVIDER_SITE_OTHER): Payer: 59 | Admitting: Family Medicine

## 2021-05-01 ENCOUNTER — Other Ambulatory Visit: Payer: Self-pay

## 2021-05-01 ENCOUNTER — Encounter: Payer: Self-pay | Admitting: Family Medicine

## 2021-05-01 VITALS — BP 116/76 | HR 98 | Temp 98.6°F | Wt 298.0 lb

## 2021-05-01 DIAGNOSIS — I10 Essential (primary) hypertension: Secondary | ICD-10-CM | POA: Diagnosis not present

## 2021-05-01 DIAGNOSIS — K2920 Alcoholic gastritis without bleeding: Secondary | ICD-10-CM

## 2021-05-01 DIAGNOSIS — E559 Vitamin D deficiency, unspecified: Secondary | ICD-10-CM

## 2021-05-01 DIAGNOSIS — R000 Tachycardia, unspecified: Secondary | ICD-10-CM

## 2021-05-01 DIAGNOSIS — R251 Tremor, unspecified: Secondary | ICD-10-CM

## 2021-05-01 DIAGNOSIS — E538 Deficiency of other specified B group vitamins: Secondary | ICD-10-CM

## 2021-05-01 DIAGNOSIS — F418 Other specified anxiety disorders: Secondary | ICD-10-CM

## 2021-05-01 DIAGNOSIS — F319 Bipolar disorder, unspecified: Secondary | ICD-10-CM

## 2021-05-01 DIAGNOSIS — K7011 Alcoholic hepatitis with ascites: Secondary | ICD-10-CM

## 2021-05-01 DIAGNOSIS — F1021 Alcohol dependence, in remission: Secondary | ICD-10-CM

## 2021-05-01 DIAGNOSIS — G4700 Insomnia, unspecified: Secondary | ICD-10-CM

## 2021-05-01 DIAGNOSIS — L89309 Pressure ulcer of unspecified buttock, unspecified stage: Secondary | ICD-10-CM

## 2021-05-01 DIAGNOSIS — E519 Thiamine deficiency, unspecified: Secondary | ICD-10-CM

## 2021-05-01 MED ORDER — LACTULOSE 10 GM/15ML PO SOLN: 30.0000 g | Freq: Every day | ORAL | 5 refills | Status: DC

## 2021-05-01 MED ORDER — FUROSEMIDE 40 MG PO TABS: 40.0000 mg | ORAL_TABLET | Freq: Every day | ORAL | 1 refills | Status: DC

## 2021-05-01 MED ORDER — PANTOPRAZOLE SODIUM 40 MG PO TBEC: 40.0000 mg | DELAYED_RELEASE_TABLET | Freq: Every day | ORAL | 1 refills | Status: DC

## 2021-05-01 MED ORDER — GABAPENTIN 300 MG PO CAPS: ORAL_CAPSULE | ORAL | 5 refills | Status: DC

## 2021-05-01 MED ORDER — METOPROLOL SUCCINATE ER 50 MG PO TB24: 50.0000 mg | ORAL_TABLET | Freq: Every day | ORAL | 1 refills | Status: DC

## 2021-05-01 MED ORDER — SPIRONOLACTONE 100 MG PO TABS: 100.0000 mg | ORAL_TABLET | Freq: Every day | ORAL | 1 refills | Status: DC

## 2021-05-01 MED ORDER — QUETIAPINE FUMARATE 25 MG PO TABS: ORAL_TABLET | ORAL | 5 refills | Status: DC

## 2021-05-01 NOTE — Progress Notes (Signed)
This visit occurred during the SARS-CoV-2 public health emergency.  Safety protocols were in place, including screening questions prior to the visit, additional usage of staff PPE, and extensive cleaning of exam room while observing appropriate contact time as indicated for disinfecting solutions.    Brandi Kirk , 01-Aug-1986, 35 y.o., female MRN: [id number] Patient Care Team    Relationship Specialty Notifications Start End  [name], DO PCP - General Family Medicine  03/15/18   [name], MD Consulting Physician Obstetrics and Gynecology  03/15/18   Lucienne Capers, MD Referring Physician Internal Medicine  05/01/21     Chief Complaint  Patient presents with   Hospitalization Follow-up    Pt not fasting      Subjective: Brandi Kirk is a 35 y.o. 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  She states she has not had a drink since being discharged.  She feels greatly improved.  She is tolerating p.o.  Bowel movements are regular.  She reports she has been taking her medications as directed.  She does feel she could use more help in the evening with her anxiety and sleep.  She was unable to afford the rifaximin.  She is not taking the lactulose right now.  Depression screen PHQ 2/9 05/02/2020  Decreased Interest 1  Down, Depressed, Hopeless 1  PHQ - 2 Score 2  Altered sleeping 2  Tired, decreased energy 3  Change in appetite 3  Feeling bad or failure about yourself  1  Trouble concentrating 0  Moving slowly or fidgety/restless 1  Suicidal thoughts 0  PHQ-9 Score 12  Difficult doing work/chores Somewhat difficult  Some encounter information is confidential and restricted. Go to Review Flowsheets activity to see all data.    Allergies  Allergen Reactions   Ciprofloxacin Other (See Comments)    intolerance   Septra [Sulfamethoxazole-Trimethoprim] Other (See Comments)    Thrush    Tylenol  [Acetaminophen] Other (See Comments)    Patient stated she can't take it due to liver   Social History   Social History Narrative   Married. 1 child.    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    Alcohol abuse. Smoker.   Smoke alarm in the home   feels safe in her relationships.    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[place])    Depression    Fractured rib 2019   Frequent headaches    Frequent UTI    GERD (gastroesophageal reflux disease)    HPV in female    Hypertension    Hypomagnesemia 11/17/2017   Migraine    Multiple closed fractures of ribs of right side 03/15/2018   PCOS (polycystic ovarian syndrome)    Prediabetes    Vocal cord dysfunction    Past Surgical History:  Procedure Laterality Date   WISDOM TOOTH EXTRACTION     Family History  Problem Relation Age of Onset   Diabetes Mother    Stroke Mother 4   Hypertension Mother    Heart murmur Mother    Alcohol abuse Mother    Arthritis Mother  Depression Mother    Early death Mother    Hyperlipidemia Mother    Kidney disease Mother    Mental illness Mother    Epilepsy Mother    Rheum arthritis Sister    Heart murmur Sister    Alcohol abuse Sister    Bipolar disorder Sister    Cirrhosis Sister    Heart murmur Brother    Alcohol abuse Brother    Depression Brother    Mental illness Brother        bipolar   Alcohol abuse Father    Depression Father    Mental illness Father    Arthritis Maternal Grandmother    Birth defects Maternal Grandmother    Hyperlipidemia Maternal Grandmother    Hyperlipidemia Maternal Grandfather    Alcohol abuse Maternal Grandfather    Arthritis Maternal Grandfather    Diabetes Maternal  Grandfather    Stroke Maternal Grandfather    Arthritis Paternal Grandmother    Arthritis Paternal Grandfather    Birth defects Brother    Mental illness Brother        bipolar   Depression Brother    Mental illness Brother    Allergies as of 05/01/2021       Reactions   Ciprofloxacin Other (See Comments)   intolerance   Septra [sulfamethoxazole-trimethoprim] Other (See Comments)   Thrush   Tylenol [acetaminophen] Other (See Comments)   Patient stated she can't take it due to liver        Medication List        Accurate as of May 01, 2021 11:59 PM. If you have any questions, ask your nurse or doctor.          STOP taking these medications    chlordiazePOXIDE 25 MG capsule Commonly known as: LIBRIUM Stopped by: Howard Pouch, DO   cloNIDine 0.2 MG tablet Commonly known as: CATAPRES Stopped by: Howard Pouch, DO   clotrimazole-betamethasone cream Commonly known as: LOTRISONE Stopped by: Howard Pouch, DO   diazepam 5 MG tablet Commonly known as: VALIUM Stopped by: Howard Pouch, DO   DULoxetine 60 MG capsule Commonly known as: CYMBALTA Stopped by: Howard Pouch, DO   hydrocortisone 25 MG suppository Commonly known as: ANUSOL-HC Stopped by: Howard Pouch, DO   hydrOXYzine 50 MG tablet Commonly known as: ATARAX/VISTARIL Stopped by: Howard Pouch, DO   magnesium chloride 64 MG Tbec SR tablet Commonly known as: SLOW-MAG Stopped by: Howard Pouch, DO   multivitamin tablet Stopped by: Howard Pouch, DO   naproxen 250 MG tablet Commonly known as: NAPROSYN Stopped by: Howard Pouch, DO   omeprazole 40 MG capsule Commonly known as: PRILOSEC Stopped by: Howard Pouch, DO   oxyCODONE 5 MG immediate release tablet Commonly known as: Oxy IR/ROXICODONE Stopped by: Howard Pouch, DO   rifaximin 550 MG Tabs tablet Commonly known as: XIFAXAN Stopped by: Howard Pouch, DO       TAKE these medications    Cholecalciferol 25 MCG (1000 UT) tablet Take 1 tablet  (1,000 Units total) by mouth daily.   folic acid 1 MG tablet Commonly known as: FOLVITE Take 1 tablet (1 mg total) by mouth daily.   furosemide 40 MG tablet Commonly known as: LASIX Take 1 tablet (40 mg total) by mouth daily.   gabapentin 300 MG capsule Commonly known as: NEURONTIN TAKE 1 CAPSULE BY MOUTH THREE TIMES A DAY   lactulose 10 GM/15ML solution Commonly known as: CHRONULAC Take 45-67.5 mLs (30-45 g total) by mouth daily. What changed:  how much to take when to take this Changed by: Howard Pouch, DO   levonorgestrel 20 MCG/24HR IUD Commonly known as: MIRENA 1 each by Intrauterine route continuous.   metoprolol succinate 50 MG 24 hr tablet Commonly known as: TOPROL-XL Take 1 tablet (50 mg total) by mouth daily. Take with or immediately following a meal.   pantoprazole 40 MG tablet Commonly known as: PROTONIX Take 1 tablet (40 mg total) by mouth daily.   QUEtiapine 25 MG tablet Commonly known as: SEROQUEL 1 tab p.o. in the morning and 2 tabs in the evening. What changed:  how much to take how to take this when to take this additional instructions Another medication with the same name was removed. Continue taking this medication, and follow the directions you see here. Changed by: Howard Pouch, DO   spironolactone 100 MG tablet Commonly known as: ALDACTONE Take 1 tablet (100 mg total) by mouth daily.   thiamine 100 MG tablet TAKE 1 TABLET BY MOUTH EVERY DAY        All past medical history, surgical history, allergies, family history, immunizations andmedications were updated in the EMR today and reviewed under the history and medication portions of their EMR.     ROS: Negative, with the exception of above mentioned in HPI   Objective:  BP 116/76   Pulse 98   Temp 98.6 F (37 C) (Oral)   Wt 298 lb (135.2 kg)   SpO2 96%   BMI 52.79 kg/m  Body mass index is 52.79 kg/m. Gen: Afebrile. No acute distress.  Nontoxic.  Very pleasant, obese  female. HENT: AT. [place].  Eyes:Pupils Equal Round Reactive to light, Extraocular movements intact,  Conjunctiva without redness, discharge or icterus. CV: RRR no murmur, no edema Chest: CTAB, no wheeze or crackles Abd: Soft.  X2 very small skin tears panniculus fold.  No erythema.  No drainage.  Morbidly obese. NTND. BS present.  Skin: Bilateral buttocks pressure sores well-healing.  No erythema.  No drainage.  Superficial. Neuro: Normal gait. PERLA. EOMi. Alert. Oriented x3 Psych: Normal affect, dress and demeanor. Normal speech. Normal thought content and judgment.  No results found. No results found. No results found for this or any previous visit (from the past 24 hour(s)).  Assessment/Plan: Brandi Kirk is a 35 y.o. female present for OV for  Essential hypertension/Tremor/Tachycardia Stable. Continue metoprolol 50 mg daily. Low-sodium diet. Follow-up 5.5 months.  Vitamin B1 deficiency/Folate deficiency/vitamin D deficiency Continue thiamine supplement Continue folate supplementation. Continue vitamin D supplementation  Pressure ulcer: Bilateral buttocks pressure ulcers are healing very well.  No signs of infection today. Continue cream twice daily and keep covered until fully healed. Warm Epson salt soaks can be beneficial.  Insomnia, unspecified type/Depression with anxiety/Bipolar 1 disorder ([place]) Doing well. Continue Seroquel 25 mg during the day and increase night dose to 50 mg. Continue gabapentin 300 mg 3 times daily  Chronic alcoholic gastritis without hemorrhage Continue Protonix 40 mg daily.  Alcohol use disorder, moderate, dependence (HCC)/Alcoholic hepatitis with ascites/Alcohol use disorder, severe, in early remission, dependence ([place]) -She is doing very well, per her report and her husband's.  She has not had a drink.  She has not attended any AA meetings as of yet.  She was encouraged to do so-she reported she plans to start. -Meld score 13. 6%/3  months -Rifaximin was not affordable.  Therefore she will need to start back on the lactulose. -Lactulose 30-45 g divided dose daily. -Continue spironolactone 100 mg daily -Continue Lasix  40 mg daily -Referral to gastroenterology placed. -CBC, CMP collected today.  Follow-up on chronic conditions in 5.5 months, sooner if needed.  Reviewed expectations re: course of current medical issues. Discussed self-management of symptoms. Outlined signs and symptoms indicating need for more acute intervention. Patient verbalized understanding and all questions were answered. Patient received an After-Visit Summary.    Orders Placed This Encounter  Procedures   Comp Met (CMET)   CBC w/Diff   Ambulatory referral to Gastroenterology   Amb ref to Medical Nutrition Therapy-MNT    Meds ordered this encounter  Medications   gabapentin (NEURONTIN) 300 MG capsule    Sig: TAKE 1 CAPSULE BY MOUTH THREE TIMES A DAY    Dispense:  90 capsule    Refill:  5   metoprolol succinate (TOPROL-XL) 50 MG 24 hr tablet    Sig: Take 1 tablet (50 mg total) by mouth daily. Take with or immediately following a meal.    Dispense:  90 tablet    Refill:  1   pantoprazole (PROTONIX) 40 MG tablet    Sig: Take 1 tablet (40 mg total) by mouth daily.    Dispense:  90 tablet    Refill:  1   spironolactone (ALDACTONE) 100 MG tablet    Sig: Take 1 tablet (100 mg total) by mouth daily.    Dispense:  90 tablet    Refill:  1   QUEtiapine (SEROQUEL) 25 MG tablet    Sig: 1 tab p.o. in the morning and 2 tabs in the evening.    Dispense:  120 tablet    Refill:  5   furosemide (LASIX) 40 MG tablet    Sig: Take 1 tablet (40 mg total) by mouth daily.    Dispense:  90 tablet    Refill:  1   lactulose (CHRONULAC) 10 GM/15ML solution    Sig: Take 45-67.5 mLs (30-45 g total) by mouth daily.    Dispense:  1350 mL    Refill:  5    Referral Orders  Ambulatory referral to Gastroenterology  Amb ref to Medical Nutrition  Therapy-MNT     Note is dictated utilizing voice recognition software. Although note has been proof read prior to signing, occasional typographical errors still can be missed. If any questions arise, please do not hesitate to call for verification.   electronically signed by:  Howard Pouch, DO  [place]

## 2021-05-01 NOTE — Patient Instructions (Addendum)
I am so proud of you.  I have refilled the meds we discussed and referred you to gastroenterology.

## 2021-05-02 LAB — CBC WITH DIFFERENTIAL/PLATELET
Basophils Absolute: 0.2 10*3/uL — ABNORMAL HIGH (ref 0.0–0.1)
Basophils Relative: 2.8 % (ref 0.0–3.0)
Eosinophils Absolute: 0.1 10*3/uL (ref 0.0–0.7)
Eosinophils Relative: 1.8 % (ref 0.0–5.0)
HCT: 39 % (ref 36.0–46.0)
Hemoglobin: 12.6 g/dL (ref 12.0–15.0)
Lymphocytes Relative: 18.9 % (ref 12.0–46.0)
Lymphs Abs: 1.5 10*3/uL (ref 0.7–4.0)
MCHC: 32.3 g/dL (ref 30.0–36.0)
MCV: 104.9 fl — ABNORMAL HIGH (ref 78.0–100.0)
Monocytes Absolute: 0.5 10*3/uL (ref 0.1–1.0)
Monocytes Relative: 6.5 % (ref 3.0–12.0)
Neutro Abs: 5.6 10*3/uL (ref 1.4–7.7)
Neutrophils Relative %: 70 % (ref 43.0–77.0)
Platelets: 245 10*3/uL (ref 150.0–400.0)
RBC: 3.71 Mil/uL — ABNORMAL LOW (ref 3.87–5.11)
RDW: 15.1 % (ref 11.5–15.5)
WBC: 8 10*3/uL (ref 4.0–10.5)

## 2021-05-02 LAB — COMPREHENSIVE METABOLIC PANEL
ALT: 76 U/L — ABNORMAL HIGH (ref 0–35)
AST: 101 U/L — ABNORMAL HIGH (ref 0–37)
Albumin: 4.1 g/dL (ref 3.5–5.2)
Alkaline Phosphatase: 182 U/L — ABNORMAL HIGH (ref 39–117)
BUN: 8 mg/dL (ref 6–23)
CO2: 26 mEq/L (ref 19–32)
Calcium: 9.6 mg/dL (ref 8.4–10.5)
Chloride: 97 mEq/L (ref 96–112)
Creatinine, Ser: 0.75 mg/dL (ref 0.40–1.20)
GFR: 104.88 mL/min (ref >60.00)
Glucose, Bld: 171 mg/dL — ABNORMAL HIGH (ref 70–99)
Potassium: 3.7 mEq/L (ref 3.5–5.1)
Sodium: 136 mEq/L (ref 135–145)
Total Bilirubin: 2.6 mg/dL — ABNORMAL HIGH (ref 0.2–1.2)
Total Protein: 7.9 g/dL (ref 6.0–8.3)

## 2021-05-03 ENCOUNTER — Encounter: Payer: Self-pay | Admitting: Family Medicine

## 2021-05-03 DIAGNOSIS — E559 Vitamin D deficiency, unspecified: Secondary | ICD-10-CM | POA: Insufficient documentation

## 2021-05-06 ENCOUNTER — Telehealth: Payer: Self-pay | Admitting: Family Medicine

## 2021-05-06 NOTE — Telephone Encounter (Signed)
FYI: On checkout, Dr. Raoul Pitch requested we obtain patients last pap results. Patient filled out ROI form for [facility]. Received fax reply stating they have not seen patient is over three years.

## 2021-05-09 ENCOUNTER — Other Ambulatory Visit: Payer: Self-pay | Admitting: Family Medicine

## 2021-05-09 ENCOUNTER — Other Ambulatory Visit: Payer: Self-pay

## 2021-05-13 ENCOUNTER — Other Ambulatory Visit: Payer: Self-pay | Admitting: Family Medicine

## 2021-05-23 ENCOUNTER — Encounter: Payer: Self-pay | Admitting: Family Medicine

## 2021-05-23 NOTE — Telephone Encounter (Signed)
Please advise 

## 2021-06-07 ENCOUNTER — Other Ambulatory Visit: Payer: Self-pay

## 2021-06-08 ENCOUNTER — Other Ambulatory Visit: Payer: Self-pay | Admitting: Family Medicine

## 2021-06-13 LAB — HM PAP SMEAR

## 2021-06-16 ENCOUNTER — Encounter: Payer: Self-pay | Admitting: Family Medicine

## 2021-06-27 ENCOUNTER — Encounter: Payer: Self-pay | Admitting: Family Medicine

## 2021-07-10 ENCOUNTER — Other Ambulatory Visit: Payer: Self-pay | Admitting: Family Medicine

## 2021-07-17 ENCOUNTER — Encounter: Payer: Self-pay | Admitting: Family Medicine

## 2021-07-17 ENCOUNTER — Ambulatory Visit (INDEPENDENT_AMBULATORY_CARE_PROVIDER_SITE_OTHER): Payer: 59 | Admitting: Family Medicine

## 2021-07-17 ENCOUNTER — Other Ambulatory Visit: Payer: Self-pay

## 2021-07-17 ENCOUNTER — Ambulatory Visit (HOSPITAL_BASED_OUTPATIENT_CLINIC_OR_DEPARTMENT_OTHER)
Admission: RE | Admit: 2021-07-17 | Discharge: 2021-07-17 | Disposition: A | Discharge status: Home / Self Care | Payer: 59 | Source: Ambulatory Visit | Attending: Family Medicine | Admitting: Family Medicine

## 2021-07-17 VITALS — BP 108/77 | HR 91 | Temp 97.9°F | Ht 63.0 in | Wt 285.0 lb

## 2021-07-17 DIAGNOSIS — I10 Essential (primary) hypertension: Secondary | ICD-10-CM

## 2021-07-17 DIAGNOSIS — R5383 Other fatigue: Secondary | ICD-10-CM

## 2021-07-17 DIAGNOSIS — E559 Vitamin D deficiency, unspecified: Secondary | ICD-10-CM

## 2021-07-17 DIAGNOSIS — K7030 Alcoholic cirrhosis of liver without ascites: Secondary | ICD-10-CM

## 2021-07-17 DIAGNOSIS — R000 Tachycardia, unspecified: Secondary | ICD-10-CM

## 2021-07-17 DIAGNOSIS — Z23 Encounter for immunization: Secondary | ICD-10-CM

## 2021-07-17 DIAGNOSIS — F1021 Alcohol dependence, in remission: Secondary | ICD-10-CM

## 2021-07-17 DIAGNOSIS — Z6841 Body Mass Index (BMI) 40.0 and over, adult: Secondary | ICD-10-CM

## 2021-07-17 DIAGNOSIS — E538 Deficiency of other specified B group vitamins: Secondary | ICD-10-CM

## 2021-07-17 DIAGNOSIS — F1910 Other psychoactive substance abuse, uncomplicated: Secondary | ICD-10-CM

## 2021-07-17 DIAGNOSIS — M545 Low back pain, unspecified: Secondary | ICD-10-CM

## 2021-07-17 DIAGNOSIS — F418 Other specified anxiety disorders: Secondary | ICD-10-CM

## 2021-07-17 DIAGNOSIS — F1020 Alcohol dependence, uncomplicated: Secondary | ICD-10-CM

## 2021-07-17 DIAGNOSIS — F319 Bipolar disorder, unspecified: Secondary | ICD-10-CM

## 2021-07-17 DIAGNOSIS — E662 Morbid (severe) obesity with alveolar hypoventilation: Secondary | ICD-10-CM

## 2021-07-17 DIAGNOSIS — R600 Localized edema: Secondary | ICD-10-CM

## 2021-07-17 DIAGNOSIS — E519 Thiamine deficiency, unspecified: Secondary | ICD-10-CM

## 2021-07-17 DIAGNOSIS — F5101 Primary insomnia: Secondary | ICD-10-CM

## 2021-07-17 LAB — COMPREHENSIVE METABOLIC PANEL
ALT: 38 U/L — ABNORMAL HIGH (ref 0–35)
AST: 27 U/L (ref 0–37)
Albumin: 4.2 g/dL (ref 3.5–5.2)
Alkaline Phosphatase: 135 U/L — ABNORMAL HIGH (ref 39–117)
BUN: 12 mg/dL (ref 6–23)
CO2: 28 mEq/L (ref 19–32)
Calcium: 9.9 mg/dL (ref 8.4–10.5)
Chloride: 98 mEq/L (ref 96–112)
Creatinine, Ser: 0.8 mg/dL (ref 0.40–1.20)
GFR: 96.92 mL/min (ref >60.00)
Glucose, Bld: 107 mg/dL — ABNORMAL HIGH (ref 70–99)
Potassium: 3.6 mEq/L (ref 3.5–5.1)
Sodium: 137 mEq/L (ref 135–145)
Total Bilirubin: 1.1 mg/dL (ref 0.2–1.2)
Total Protein: 7.8 g/dL (ref 6.0–8.3)

## 2021-07-17 LAB — TSH: TSH: 5.26 u[IU]/mL (ref 0.35–5.50)

## 2021-07-17 LAB — CBC WITH DIFFERENTIAL/PLATELET
Basophils Absolute: 0 10*3/uL (ref 0.0–0.1)
Basophils Relative: 0.2 % (ref 0.0–3.0)
Eosinophils Absolute: 0.2 10*3/uL (ref 0.0–0.7)
Eosinophils Relative: 1.4 % (ref 0.0–5.0)
HCT: 42.9 % (ref 36.0–46.0)
Hemoglobin: 14.1 g/dL (ref 12.0–15.0)
Lymphocytes Relative: 26.9 % (ref 12.0–46.0)
Lymphs Abs: 3 10*3/uL (ref 0.7–4.0)
MCHC: 32.9 g/dL (ref 30.0–36.0)
MCV: 90.4 fl (ref 78.0–100.0)
Monocytes Absolute: 0.6 10*3/uL (ref 0.1–1.0)
Monocytes Relative: 5 % (ref 3.0–12.0)
Neutro Abs: 7.5 10*3/uL (ref 1.4–7.7)
Neutrophils Relative %: 66.5 % (ref 43.0–77.0)
Platelets: 299 10*3/uL (ref 150.0–400.0)
RBC: 4.75 Mil/uL (ref 3.87–5.11)
RDW: 13.4 % (ref 11.5–15.5)
WBC: 11.3 10*3/uL — ABNORMAL HIGH (ref 4.0–10.5)

## 2021-07-17 LAB — B12 AND FOLATE PANEL
Folate: >23.4 ng/mL (ref >5.9)
Vitamin B-12: 268 pg/mL (ref 211–911)

## 2021-07-17 LAB — MAGNESIUM: Magnesium: 1.9 mg/dL (ref 1.5–2.5)

## 2021-07-17 LAB — VITAMIN D 25 HYDROXY (VIT D DEFICIENCY, FRACTURES): VITD: 36.55 ng/mL (ref 30.00–100.00)

## 2021-07-17 MED ORDER — THIAMINE HCL 100 MG PO TABS: 100.0000 mg | ORAL_TABLET | Freq: Every day | ORAL | 3 refills | Status: DC

## 2021-07-17 NOTE — Progress Notes (Signed)
This visit occurred during the SARS-CoV-2 public health emergency.  Safety protocols were in place, including screening questions prior to the visit, additional usage of staff PPE, and extensive cleaning of exam room while observing appropriate contact time as indicated for disinfecting solutions.    Brandi Kirk , December 04, 1985, 35 y.o., female MRN: [id number] Patient Care Team    Relationship Specialty Notifications Start End  [name], DO PCP - General Family Medicine  03/15/18   [name], MD Consulting Physician Obstetrics and Gynecology  03/15/18   Lucienne Capers, MD Referring Physician Internal Medicine  05/01/21     Chief Complaint  Patient presents with   Anemia    Pt c/o fatigue x 2 mos; had labs with weight loss clinic; pt is fasting      Subjective: Brandi Kirk is a 35 y.o. female present for cmc and fatigue. Essential hypertension/Tremor/Tachycardia Pt reports compliance with Toprol 50 mg XL daily, spironolactone 100 mg daily, Lasix 40 mg daily. Blood pressures ranges at home normal. Patient denies chest pain, shortness of breath or lower extremity edema.   Vitamin B1 deficiency/Folate deficiency/vitamin D deficiency/B12 deficiency Patient reports she is currently taking B1 and folate she is uncertain if she is taking vitamin D at this time.  Insomnia, unspecified type/Depression with anxiety/Bipolar 1 disorder [facility]) Patient reports she is having great difficulty falling asleep now since she has been sober.  She reports compliance with Seroquel 25 mg in the day and 50 mg nightly.  She reports compliance with gabapentin 300 mg 3 times daily.   Chronic alcoholic gastritis without hemorrhage Reports her gastritis has been well controlled since she stopped drinking.  Alcohol use disorder, moderate, dependence (HCC)/Alcoholic hepatitis with ascites/Alcohol use disorder, severe, in early remission, dependence ([place]) -He has maintained sobriety.  Extremely proud  of her.  Fatigue/back pain: Patient reports she is severely fatigued over the last 2 months.  She feels like she never wakes with any energy.  She also endorses experiencing low back pain with activity, especially when standing for longer periods of time.  She reports the pain is across her lower back when it occurs.  She denies any radiating pain.  Depression screen PHQ 2/9 05/02/2020  Decreased Interest 1  Down, Depressed, Hopeless 1  PHQ - 2 Score 2  Altered sleeping 2  Tired, decreased energy 3  Change in appetite 3  Feeling bad or failure about yourself  1  Trouble concentrating 0  Moving slowly or fidgety/restless 1  Suicidal thoughts 0  PHQ-9 Score 12  Difficult doing work/chores Somewhat difficult  Some encounter information is confidential and restricted. Go to Review Flowsheets activity to see all data.    Allergies  Allergen Reactions   Ciprofloxacin Other (See Comments)    intolerance   Septra [Sulfamethoxazole-Trimethoprim] Other (See Comments)    Thrush    Tylenol [Acetaminophen] Other (See Comments)    Patient stated she can't take it due to liver   Social History   Social History Narrative   Married. 1 child.    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    Alcohol abuse. Smoker.   Smoke alarm in the home   feels safe in her relationships.    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[place])    Depression    Fractured rib 2019   Frequent headaches    Frequent UTI    GERD (gastroesophageal reflux disease)    HPV in female    Hypertension    Hypomagnesemia 11/17/2017   Migraine    Multiple closed fractures of ribs of right side 03/15/2018   PCOS (polycystic ovarian syndrome)     Prediabetes    Vocal cord dysfunction    Past Surgical History:  Procedure Laterality Date   WISDOM TOOTH EXTRACTION     Family History  Problem Relation Age of Onset   Diabetes Mother    Stroke Mother 6   Hypertension Mother    Heart murmur Mother    Alcohol abuse Mother    Arthritis Mother    Depression Mother    Early death Mother    Hyperlipidemia Mother    Kidney disease Mother    Mental illness Mother    Epilepsy Mother    Rheum arthritis Sister    Heart murmur Sister    Alcohol abuse Sister    Bipolar disorder Sister    Cirrhosis Sister    Heart murmur Brother    Alcohol abuse Brother    Depression Brother    Mental illness Brother        bipolar   Alcohol abuse Father    Depression Father    Mental illness Father    Arthritis Maternal Grandmother    Birth defects Maternal Grandmother    Hyperlipidemia Maternal Grandmother    Hyperlipidemia Maternal Grandfather    Alcohol abuse Maternal Grandfather    Arthritis Maternal Grandfather    Diabetes Maternal Grandfather    Stroke Maternal Grandfather    Arthritis Paternal Grandmother    Arthritis Paternal Grandfather    Birth defects Brother    Mental illness Brother        bipolar   Depression Brother    Mental illness Brother    Allergies as of 07/17/2021       Reactions   Ciprofloxacin Other (See Comments)   intolerance   Septra [sulfamethoxazole-trimethoprim] Other (See Comments)   Thrush   Tylenol [acetaminophen] Other (See Comments)   Patient stated she can't take it due to liver        Medication List        Accurate as of July 17, 2021 11:59 PM. If you have any questions, ask your nurse or doctor.          STOP taking these medications    furosemide 40 MG tablet Commonly known as: LASIX Stopped by: Howard Pouch, DO       TAKE these medications    CALCIUM PO Take 1 tablet by mouth daily.   Cholecalciferol 25 MCG (1000 UT) tablet Take 1 tablet (1,000 Units total) by  mouth daily.   ferrous sulfate 324 MG Tbec Take 324 mg by mouth.   folic acid 1 MG tablet Commonly known as: FOLVITE Take 1 tablet (1 mg total) by mouth daily.   gabapentin 300 MG capsule Commonly known as: NEURONTIN Take 2 capsules (600 mg total) by mouth at bedtime. What changed:  how much to take how to take this when to take this additional instructions Changed by: Howard Pouch, DO   lactulose 10 GM/15ML solution Commonly known as: CHRONULAC Take 45-67.5 mLs (30-45 g total) by mouth daily.   levonorgestrel  20 MCG/24HR IUD Commonly known as: MIRENA 1 each by Intrauterine route continuous.   meloxicam 15 MG tablet Commonly known as: MOBIC Once daily with food Started by: Howard Pouch, DO   metoprolol succinate 50 MG 24 hr tablet Commonly known as: TOPROL-XL Take 1 tablet (50 mg total) by mouth daily. Take with or immediately following a meal.   multivitamin with minerals tablet Take 1 tablet by mouth daily.   pantoprazole 40 MG tablet Commonly known as: PROTONIX Take 1 tablet (40 mg total) by mouth daily.   QUEtiapine 25 MG tablet Commonly known as: SEROQUEL 1 tab p.o. in the morning and 2 tabs in the evening.   spironolactone 100 MG tablet Commonly known as: ALDACTONE Take 1 tablet (100 mg total) by mouth daily.   thiamine 100 MG tablet Take 1 tablet (100 mg total) by mouth daily.        All past medical history, surgical history, allergies, family history, immunizations andmedications were updated in the EMR today and reviewed under the history and medication portions of their EMR.     ROS: Negative, with the exception of above mentioned in HPI   Objective:  BP 108/77   Pulse 91   Temp 97.9 F (36.6 C) (Oral)   Ht _0  (1.6 m)   Wt 285 lb (129.3 kg)   SpO2 98%   BMI 50.49 kg/m  Body mass index is 50.49 kg/m. Gen: Afebrile. No acute distress.  Nontoxic, very pleasant obese female HENT: AT. [place].  No cough.  No hoarseness. Eyes:Pupils  Equal Round Reactive to light, Extraocular movements intact,  Conjunctiva without redness, discharge or icterus. Neck/lymp/endocrine: Supple, no lymphadenopathy, no thyromegaly CV: RRR no murmur, no edema Chest: CTAB, no wheeze or crackles Skin: No rashes, purpura or petechiae.  Neuro: Normal gait. PERLA. EOMi. Alert. Oriented x3 Psych: Normal affect, dress and demeanor. Normal speech. Normal thought content and judgment.    No results found. No results found. No results found for this or any previous visit (from the past 24 hour(s)).  Assessment/Plan: Persephone Schriever is a 35 y.o. female present for OV for  Essential hypertension/Tremor/Tachycardia Stable Continue metoprolol 50 mg daily-May attempt to decrease at next appointment if able Continue spironolactone 100 mg daily Discontinue Lasix 40 mg daily, can keep on as a as needed for now. Low-sodium diet. CBC, CMP, TSH Follow-up scheduled next month.  Vitamin B1 deficiency/Folate deficiency/vitamin D deficiency/B12 deficiency Continue all vitamin supplements Continue thiamine supplement Continue folate-B12 supplementation. Continue vitamin D supplementation Patient was encouraged to add back sublingual B12 1000 mcg daily.  Insomnia, unspecified type/Depression with anxiety/Bipolar 1 disorder ([place]) Since patient is complaining of more fatigue we will attempt to lower her Seroquel dose and start to lower her gabapentin as well while providing nighttime coverage for her insomnia. Decrease Seroquel by discontinuing daytime dose of 25 mg and continuing evening dose of Seroquel 50 mg nightly.   Discontinue daytime doses of gabapentin, increase gabapentin to 600 mg before bed.  Chronic alcoholic gastritis without hemorrhage Continue Protonix 40 mg daily.  Alcohol use disorder, moderate, dependence (HCC)/Alcoholic hepatitis with ascites/Alcohol use disorder, severe, in early remission, dependence ([place]) -He has maintained sobriety.   Extremely happy for her.   -Continue lactulose for now- Lactulose 30-45 g divided dose daily. -Continue spironolactone 100 mg daily -We will attempt to discontinue Lasix and use as as needed.   -Referral to gastroenterology has been placed.  Fatigue: B12, vitamin D levels collected today. Thyroid panel collected today.  Iron panel collected today. CBC, CMP and magnesium collected today. Will attempt to discontinue Lasix and uses as needed.  Mild dehydration could be a factor. Will rearrange gabapentin and Seroquel doses to evening doses only now that she has maintained her sobriety.  Lumbar back pain: Trial of Mobic daily and start physical therapy for strengthening. Lumbar x-ray ordered today and looked excellent.  No signs of degenerative disease.  Believe her pain is more deconditioning. Trial of Mobic 15 mg daily with food, with caution on her prior history of gastritis.  Her gastritis was secondary to her alcohol consumption and she has continued the Protonix 40 mg daily.  Influenza vaccine administered today  Reviewed expectations re: course of current medical issues. Discussed self-management of symptoms. Outlined signs and symptoms indicating need for more acute intervention. Patient verbalized understanding and all questions were answered. Patient received an After-Visit Summary.    Orders Placed This Encounter  Procedures   DG Lumbar Spine Complete   Flu Vaccine QUAD 6+ mos PF IM (Fluarix Quad PF)   CBC with Differential/Platelet   Iron, TIBC and Ferritin Panel   Comp Met (CMET)   B12 and Folate Panel   Vitamin D (25 hydroxy)   TSH   Magnesium   Ambulatory referral to Physical Therapy    Meds ordered this encounter  Medications   thiamine 100 MG tablet    Sig: Take 1 tablet (100 mg total) by mouth daily.    Dispense:  90 tablet    Refill:  3   gabapentin (NEURONTIN) 300 MG capsule    Sig: Take 2 capsules (600 mg total) by mouth at bedtime.    Dispense:  180  capsule    Refill:  1   pantoprazole (PROTONIX) 40 MG tablet    Sig: Take 1 tablet (40 mg total) by mouth daily.    Dispense:  90 tablet    Refill:  1   spironolactone (ALDACTONE) 100 MG tablet    Sig: Take 1 tablet (100 mg total) by mouth daily.    Dispense:  90 tablet    Refill:  1   metoprolol succinate (TOPROL-XL) 50 MG 24 hr tablet    Sig: Take 1 tablet (50 mg total) by mouth daily. Take with or immediately following a meal.    Dispense:  90 tablet    Refill:  1   meloxicam (MOBIC) 15 MG tablet    Sig: Once daily with food    Dispense:  30 tablet    Refill:  5     Referral Orders         Ambulatory referral to Physical Therapy        Note is dictated utilizing voice recognition software. Although note has been proof read prior to signing, occasional typographical errors still can be missed. If any questions arise, please do not hesitate to call for verification.   electronically signed by:  Howard Pouch, DO  [place]

## 2021-07-17 NOTE — Patient Instructions (Addendum)
  Great to see you today.  I have refilled the medication(s) we provide.   If labs were collected, we will inform you of lab results once received either by echart message or telephone call.   - echart message- for normal results that have been seen by the patient already.   - telephone call: abnormal results or if patient has not viewed results in their echart.   Start Flonase nasal spray 1 spray each nostril twice a day.   We will call you with lab results and xray results and make treatment plan.

## 2021-07-18 ENCOUNTER — Telehealth: Payer: Self-pay | Admitting: Family Medicine

## 2021-07-18 DIAGNOSIS — M545 Low back pain, unspecified: Secondary | ICD-10-CM | POA: Insufficient documentation

## 2021-07-18 DIAGNOSIS — R5383 Other fatigue: Secondary | ICD-10-CM | POA: Insufficient documentation

## 2021-07-18 LAB — IRON,TIBC AND FERRITIN PANEL
%SAT: 23 % (calc) (ref 16–45)
Ferritin: 124 ng/mL (ref 16–154)
Iron: 70 ug/dL (ref 40–190)
TIBC: 303 mcg/dL (calc) (ref 250–450)

## 2021-07-18 MED ORDER — SPIRONOLACTONE 100 MG PO TABS: 100.0000 mg | ORAL_TABLET | Freq: Every day | ORAL | 1 refills | Status: DC

## 2021-07-18 MED ORDER — METOPROLOL SUCCINATE ER 50 MG PO TB24: 50.0000 mg | ORAL_TABLET | Freq: Every day | ORAL | 1 refills | Status: DC

## 2021-07-18 MED ORDER — PANTOPRAZOLE SODIUM 40 MG PO TBEC: 40.0000 mg | DELAYED_RELEASE_TABLET | Freq: Every day | ORAL | 1 refills | Status: DC

## 2021-07-18 MED ORDER — MELOXICAM 15 MG PO TABS: ORAL_TABLET | ORAL | 5 refills | Status: DC

## 2021-07-18 MED ORDER — GABAPENTIN 300 MG PO CAPS: 600.0000 mg | ORAL_CAPSULE | Freq: Every day | ORAL | 1 refills | Status: DC

## 2021-07-18 NOTE — Telephone Encounter (Signed)
Spoke with pt regarding labs and instructions.   

## 2021-07-18 NOTE — Telephone Encounter (Signed)
Please inform patient: Her liver function is almost completely normal now.   Vitamin D levels are in normal range Blood cell counts are in normal range, no signs of anemia Thyroid levels were just very mildly abnormal.  We will repeat these at her follow-up in 4 weeks and if continues to elevate we will need to start thyroid medication.  -Her lower lumbar/back x-ray looks excellent.  I believe all of her discomfort is coming from deconditioning and physical therapy is needed to help her strengthen her back.   -I have placed a referral to [facility] physical therapy for her to get started on back strengthening to help prevent her back pain.   -I have also called in a medication called Mobic that is taken once daily with food.  This is an anti-inflammatory and will help her back, especially while performing physical therapy and strengthening her back.  Hopefully will be able to discontinue this medicine after her back become stronger.  -She does have to use caution with the Mobic and make sure that her gastritis/reflux does not return.  If it does then we need to discontinue Mobic.   Please make sure she completely understands these directions and they write them down or can be copied to her MyChart for her.  We are going to make a few changes to help her with her fatigue.  1.  Start B12 over-the-counter 1000 mcg daily.  Must be sublingual tab or solution that is placed under the tongue.  Her B12 levels were significantly low. 2.  Seroquel: Stop the daytime dose of Seroquel.  Continue nighttime dose of Seroquel 50 mg nightly. 3.  Gabapentin: Stop both daytime doses of gabapentin, increase nighttime dose of gabapentin to 600 mg nightly. 4.  Stop Lasix.  May use 0.5-1 tab as needed-if she notices lower leg swelling only.   Looking forward to seeing her at her already scheduled appointment next month.

## 2021-08-12 ENCOUNTER — Ambulatory Visit (INDEPENDENT_AMBULATORY_CARE_PROVIDER_SITE_OTHER): Payer: 59 | Admitting: Family Medicine

## 2021-08-12 ENCOUNTER — Encounter: Payer: Self-pay | Admitting: Family Medicine

## 2021-08-12 ENCOUNTER — Other Ambulatory Visit: Payer: Self-pay

## 2021-08-12 VITALS — BP 119/80 | HR 86 | Temp 98.5°F | Ht 63.0 in | Wt 286.0 lb

## 2021-08-12 DIAGNOSIS — D72829 Elevated white blood cell count, unspecified: Secondary | ICD-10-CM

## 2021-08-12 DIAGNOSIS — E538 Deficiency of other specified B group vitamins: Secondary | ICD-10-CM

## 2021-08-12 DIAGNOSIS — I10 Essential (primary) hypertension: Secondary | ICD-10-CM

## 2021-08-12 DIAGNOSIS — R7989 Other specified abnormal findings of blood chemistry: Secondary | ICD-10-CM | POA: Diagnosis not present

## 2021-08-12 DIAGNOSIS — R000 Tachycardia, unspecified: Secondary | ICD-10-CM | POA: Diagnosis not present

## 2021-08-12 LAB — TSH: TSH: 1.75 u[IU]/mL (ref 0.35–5.50)

## 2021-08-12 LAB — HEPATIC FUNCTION PANEL
ALT: 42 U/L — ABNORMAL HIGH (ref 0–35)
AST: 29 U/L (ref 0–37)
Albumin: 4.2 g/dL (ref 3.5–5.2)
Alkaline Phosphatase: 146 U/L — ABNORMAL HIGH (ref 39–117)
Bilirubin, Direct: 0.2 mg/dL (ref 0.0–0.3)
Total Bilirubin: 1.5 mg/dL — ABNORMAL HIGH (ref 0.2–1.2)
Total Protein: 7.7 g/dL (ref 6.0–8.3)

## 2021-08-12 LAB — CBC WITH DIFFERENTIAL/PLATELET
Basophils Absolute: 0 10*3/uL (ref 0.0–0.1)
Basophils Relative: 0.5 % (ref 0.0–3.0)
Eosinophils Absolute: 0.2 10*3/uL (ref 0.0–0.7)
Eosinophils Relative: 1.7 % (ref 0.0–5.0)
HCT: 40.8 % (ref 36.0–46.0)
Hemoglobin: 13.6 g/dL (ref 12.0–15.0)
Lymphocytes Relative: 24.9 % (ref 12.0–46.0)
Lymphs Abs: 2.3 10*3/uL (ref 0.7–4.0)
MCHC: 33.2 g/dL (ref 30.0–36.0)
MCV: 88.9 fl (ref 78.0–100.0)
Monocytes Absolute: 0.5 10*3/uL (ref 0.1–1.0)
Monocytes Relative: 5.2 % (ref 3.0–12.0)
Neutro Abs: 6.3 10*3/uL (ref 1.4–7.7)
Neutrophils Relative %: 67.7 % (ref 43.0–77.0)
Platelets: 302 10*3/uL (ref 150.0–400.0)
RBC: 4.59 Mil/uL (ref 3.87–5.11)
RDW: 14.2 % (ref 11.5–15.5)
WBC: 9.3 10*3/uL (ref 4.0–10.5)

## 2021-08-12 LAB — VITAMIN B12: Vitamin B-12: 359 pg/mL (ref 211–911)

## 2021-08-12 LAB — T4, FREE: Free T4: 0.71 ng/dL (ref 0.60–1.60)

## 2021-08-12 MED ORDER — METOPROLOL SUCCINATE ER 25 MG PO TB24: 25.0000 mg | ORAL_TABLET | Freq: Every day | ORAL | 0 refills | Status: DC

## 2021-08-12 MED ORDER — SPIRONOLACTONE 50 MG PO TABS: 50.0000 mg | ORAL_TABLET | Freq: Every day | ORAL | 1 refills | Status: DC

## 2021-08-12 NOTE — Progress Notes (Signed)
This visit occurred during the SARS-CoV-2 public health emergency.  Safety protocols were in place, including screening questions prior to the visit, additional usage of staff PPE, and extensive cleaning of exam room while observing appropriate contact time as indicated for disinfecting solutions.    Brandi Kirk , [birth year], 35 y.o., female MRN: [id number] Patient Care Team    Relationship Specialty Notifications Start End  [name], DO PCP - General Family Medicine  03/15/18   [name], MD Consulting Physician Obstetrics and Gynecology  03/15/18   Lucienne Capers, MD Referring Physician Internal Medicine  05/01/21     Chief Complaint  Patient presents with   Hepatitis    [place]; pt is fasting      Subjective: Brandi Kirk is a 35 y.o. female present for cmc and fatigue. Essential hypertension/Tremor/Tachycardia Pt reports compliance with Toprol 50 mg XL daily, spironolactone 100 mg daily, Lasix 40 mg daily. Blood pressures ranges at home normal. Patient denies chest pain, shortness of breath, dizziness or lower extremity edema.  He is still complaining of fatigue  Vitamin B1 deficiency/Folate deficiency/vitamin D deficiency/B12 deficiency Patient reports she is currently taking B1 and folate she is uncertain if she is taking vitamin D at this time.  Insomnia, unspecified type/Depression with anxiety/Bipolar 1 disorder [facility]) Patient reports she is having great difficulty falling asleep now since she has been sober.  She has noted more restless leg symptoms.  She is more fatigued and she is uncertain if that is because she has not been sleeping well or because her medications now are making her more fatigued.  She reports compliance with Seroquel 25 mg in nightly.  She reports compliance with gabapentin 300 mg 3 times daily.   Chronic alcoholic gastritis without hemorrhage Reports her gastritis has been well controlled since she stopped drinking.  Alcohol use disorder,  moderate, dependence (HCC)/Alcoholic hepatitis with ascites/Alcohol use disorder, severe, in early remission, dependence ([place]) Continues to maintain her sobriety.  Fatigue/back pain: Patient reports she is severely fatigued over the last 2 months.  She feels like she never wakes with any energy.  She also endorses experiencing low back pain with activity, especially when standing for longer periods of time.  She reports the pain is across her lower back when it occurs.  She denies any radiating pain.  She has started physical therapy since her last visit and she does see an improvement in her strength and her flexibility.  She feels the Mobic has been helpful.  Depression screen [facility] 2/9 08/12/2021 05/02/2020  Decreased Interest 1 1  Down, Depressed, Hopeless 1 1  PHQ - 2 Score 2 2  Altered sleeping 3 2  Tired, decreased energy 2 3  Change in appetite 1 3  Feeling bad or failure about yourself  1 1  Trouble concentrating 0 0  Moving slowly or fidgety/restless 2 1  Suicidal thoughts 0 0  PHQ-9 Score 11 12  Difficult doing work/chores - Somewhat difficult  Some encounter information is confidential and restricted. Go to Review Flowsheets activity to see all data.    Allergies  Allergen Reactions   Ciprofloxacin Other (See Comments)    intolerance   Septra [Sulfamethoxazole-Trimethoprim] Other (See Comments)    Thrush    Tylenol [Acetaminophen] Other (See Comments)    Patient stated she can't take it due to liver   Social History   Social History Narrative   Married. 1 child.    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  Alcohol abuse. Smoker.   Smoke alarm in the home   feels safe in her relationships.    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[place])    Depression    Fractured rib 2019   Frequent headaches    Frequent UTI    GERD (gastroesophageal reflux disease)    HPV in female    Hypertension    Hypomagnesemia 11/17/2017   Migraine    Multiple closed fractures of ribs of right side 03/15/2018   PCOS (polycystic ovarian syndrome)    Prediabetes    Vocal cord dysfunction    Past Surgical History:  Procedure Laterality Date   WISDOM TOOTH EXTRACTION     Family History  Problem Relation Age of Onset   Diabetes Mother    Stroke Mother 45   Hypertension Mother    Heart murmur Mother    Alcohol abuse Mother    Arthritis Mother    Depression Mother    Early death Mother    Hyperlipidemia Mother    Kidney disease Mother    Mental illness Mother    Epilepsy Mother    Rheum arthritis Sister    Heart murmur Sister    Alcohol abuse Sister    Bipolar disorder Sister    Cirrhosis Sister    Heart murmur Brother    Alcohol abuse Brother    Depression Brother    Mental illness Brother        bipolar   Alcohol abuse Father    Depression Father    Mental illness Father    Arthritis Maternal Grandmother    Birth defects Maternal Grandmother    Hyperlipidemia Maternal Grandmother    Hyperlipidemia Maternal Grandfather    Alcohol abuse Maternal Grandfather    Arthritis Maternal Grandfather    Diabetes Maternal Grandfather    Stroke Maternal Grandfather    Arthritis Paternal Grandmother    Arthritis Paternal Grandfather    Birth defects Brother    Mental illness Brother        bipolar   Depression Brother    Mental illness Brother    Allergies as of 08/12/2021       Reactions   Ciprofloxacin Other (See Comments)   intolerance   Septra [sulfamethoxazole-trimethoprim] Other (See Comments)   Thrush   Tylenol [acetaminophen] Other (See Comments)   Patient stated she can't take it due to liver        Medication List        Accurate as of August 12, 2021 11:59  PM. If you have any questions, ask your nurse or doctor.          CALCIUM PO Take 1 tablet by mouth daily.   Cholecalciferol 25 MCG (1000 UT) tablet Take 1 tablet (1,000 Units total) by mouth daily.   cyanocobalamin 1000 MCG tablet Take 1,000 mcg by mouth daily. Sublingual tab   ferrous sulfate 324 MG Tbec Take 324 mg by mouth.   folic acid 1 MG tablet Commonly known as: FOLVITE Take 1 tablet (1 mg total) by mouth daily.   gabapentin 300 MG capsule Commonly known as: NEURONTIN Take 2 capsules (600 mg total) by mouth  at bedtime.   lactulose 10 GM/15ML solution Commonly known as: CHRONULAC Take 45-67.5 mLs (30-45 g total) by mouth daily.   levonorgestrel 20 MCG/24HR IUD Commonly known as: MIRENA 1 each by Intrauterine route continuous.   meloxicam 15 MG tablet Commonly known as: MOBIC Once daily with food   metoprolol succinate 25 MG 24 hr tablet Commonly known as: TOPROL-XL Take 1 tablet (25 mg total) by mouth daily. Take with or immediately following a meal. What changed:  medication strength how much to take Changed by: Howard Pouch, DO   multivitamin with minerals tablet Take 1 tablet by mouth daily.   pantoprazole 40 MG tablet Commonly known as: PROTONIX Take 1 tablet (40 mg total) by mouth daily.   QUEtiapine 25 MG tablet Commonly known as: SEROQUEL 1 tab p.o. in the morning and 2 tabs in the evening.   spironolactone 50 MG tablet Commonly known as: ALDACTONE Take 1 tablet (50 mg total) by mouth daily. What changed:  medication strength how much to take Changed by: Howard Pouch, DO   thiamine 100 MG tablet Take 1 tablet (100 mg total) by mouth daily.        All past medical history, surgical history, allergies, family history, immunizations andmedications were updated in the EMR today and reviewed under the history and medication portions of their EMR.     ROS: Negative, with the exception of above mentioned in HPI   Objective:  BP  119/80   Pulse 86   Temp 98.5 F (36.9 C) (Oral)   Ht 5' 3"  (1.6 m)   Wt 286 lb (129.7 kg)   SpO2 100%   BMI 50.66 kg/m  Body mass index is 50.66 kg/m. Gen: Afebrile. No acute distress.  Nontoxic, pleasant obese female. HENT: AT. [place].  No cough.  No hoarseness. Eyes:Pupils Equal Round Reactive to light, Extraocular movements intact,  Conjunctiva without redness, discharge or icterus. Neck/lymp/endocrine: Supple, no lymphadenopathy, no thyromegaly CV: RRR no murmur, no edema Chest: CTAB, no wheeze or crackles Skin: No rashes, purpura or petechiae.  Neuro:  Normal gait. PERLA. EOMi. Alert. Oriented x3 Psych: Normal affect, dress and demeanor. Normal speech. Normal thought content and judgment.   No results found. No results found. No results found for this or any previous visit (from the past 24 hour(s)).  Assessment/Plan: Brandi Kirk is a 35 y.o. female present for OV for  Essential hypertension/Tremor/Tachycardia Stable Decrease metoprolol 25 mg daily-if blood pressure is still stable and not tachycardic may discontinue next visit.   Decrease spironolactone 100 mg daily> 50 mg daily.  We will attempt to continue to decrease dose if able. Discontinue Lasix 40 mg daily, can keep on as a as needed for now. Low-sodium diet. LFTs collected today Thyroid panel-was mildly elevated Follow-up scheduled next month.  Vitamin B1 deficiency/Folate deficiency/vitamin D deficiency/B12 deficiency Continue all vitamin supplements Continue thiamine supplement Continue folate-B12 supplementation. Continue vitamin D supplementation  Insomnia, unspecified type/Depression with anxiety/Bipolar 1 disorder (HCC)  Continue Seroquel 25 mg nightly Discontinue gabapentin, start Lyrica 75 mg nightly, may increase to twice daily if needed on next follow-up   Chronic alcoholic gastritis without hemorrhage Continue  Protonix 40 mg daily.  Alcohol use disorder, moderate, dependence (HCC)/Alcoholic  hepatitis with ascites/Alcohol use disorder, severe, in early remission, dependence ([place]) -She  has maintained sobriety.  Extremely happy for her.   -Continue lactulose at decreased dose.  If repeat labs next visit indicate need will return to full dose.  - Lasix now as  needed -Referral to gastroenterology has been placed.  Lumbar back pain: Improving. Continue physical therapy Continue Mobic 15 mg daily   Reviewed expectations re: course of current medical issues. Discussed self-management of symptoms. Outlined signs and symptoms indicating need for more acute intervention. Patient verbalized understanding and all questions were answered. Patient received an After-Visit Summary.    Orders Placed This Encounter  Procedures   TSH   T4, free   B12   CBC w/Diff   Hepatic function panel   Thyroid peroxidase antibody     Meds ordered this encounter  Medications   spironolactone (ALDACTONE) 50 MG tablet    Sig: Take 1 tablet (50 mg total) by mouth daily.    Dispense:  90 tablet    Refill:  1    Dc higher dose.   metoprolol succinate (TOPROL-XL) 25 MG 24 hr tablet    Sig: Take 1 tablet (25 mg total) by mouth daily. Take with or immediately following a meal.    Dispense:  90 tablet    Refill:  0    Dc prior doses      Referral Orders  No referral(s) requested today       Note is dictated utilizing voice recognition software. Although note has been proof read prior to signing, occasional typographical errors still can be missed. If any questions arise, please do not hesitate to call for verification.   electronically signed by:  Howard Pouch, DO  [place]

## 2021-08-12 NOTE — Patient Instructions (Signed)
  Great to see you today.  I have refilled the medication(s) we provide.   If labs were collected, we will inform you of lab results once received either by echart message or telephone call.   - echart message- for normal results that have been seen by the patient already.   - telephone call: abnormal results or if patient has not viewed results in their echart.  Decrease metoprolol to 25 mg a day,  Decrease spirolactone to 50 mg a day.    When we call you back with lab results and discuss follow up.

## 2021-08-13 ENCOUNTER — Encounter: Payer: Self-pay | Admitting: Family Medicine

## 2021-08-13 LAB — THYROID PEROXIDASE ANTIBODY: Thyroperoxidase Ab SerPl-aCnc: 1 IU/mL (ref <9)

## 2021-08-14 ENCOUNTER — Telehealth: Payer: Self-pay | Admitting: Family Medicine

## 2021-08-14 MED ORDER — PREGABALIN 75 MG PO CAPS: 75.0000 mg | ORAL_CAPSULE | Freq: Every evening | ORAL | 1 refills | Status: DC | PRN

## 2021-08-14 NOTE — Telephone Encounter (Signed)
Spoke with pt regarding labs and instructions.   

## 2021-08-14 NOTE — Telephone Encounter (Signed)
Please inform patient the following information: -Her liver function is stable with a mildly increased alkaline phosphatase and a mildly increased ALT, normal AST again stable from last blood draw.   -Her total bilirubin was just mildly elevated at 1.5, this had been 1.1 last time.  Bilirubin levels are predictive of liver function decrease as well.  We will continue to monitor all of her liver enzymes closely every couple months.  Her gastroenterologist will also follow her liver function. -B12 levels have, mildly, however not as much as I would have expected.  I would encourage her to ensure she is using the B12 1000-2500 mcg daily and the sublingual version of the supplement.  This is a tablet or solution that is placed under the tongue for best absorption.  She will need the sublingual version in order to absorb her B12-the other option is to provide her nurse visits monthly for IM injection.  She would rather come in for IM injection every 4 weeks please set her up for this to start as soon as possible.. -Her blood cell counts are normal. -Her thyroid function went back to normal.  Additional changes in meds: Stop gabapentin qhs and start Lyrica 75 mg qhs (about 1 hr before bed)> called in to pharmacy.  For now, we will not change the seroquel. I recommend we see how she does on the present changes. Lyrica helps with restless leg, anxiety, and nerve pain - so I believe that has more benefits for her than the gabapentin alone.    Follow up in 3 mos.

## 2021-09-11 ENCOUNTER — Encounter: Payer: Self-pay | Admitting: Family Medicine

## 2021-10-04 DIAGNOSIS — Z30430 Encounter for insertion of intrauterine contraceptive device: Secondary | ICD-10-CM | POA: Diagnosis not present

## 2021-10-04 DIAGNOSIS — Z3202 Encounter for pregnancy test, result negative: Secondary | ICD-10-CM | POA: Diagnosis not present

## 2021-10-04 DIAGNOSIS — Z30433 Encounter for removal and reinsertion of intrauterine contraceptive device: Secondary | ICD-10-CM | POA: Diagnosis not present

## 2021-10-16 DIAGNOSIS — Z30431 Encounter for routine checking of intrauterine contraceptive device: Secondary | ICD-10-CM | POA: Diagnosis not present

## 2021-10-24 ENCOUNTER — Encounter: Payer: Self-pay | Admitting: Family Medicine

## 2021-10-24 NOTE — Telephone Encounter (Signed)
Pt was told to take B12 1000-2500 mcg daily. Please advise

## 2021-11-06 ENCOUNTER — Other Ambulatory Visit: Payer: Self-pay | Admitting: Family Medicine

## 2021-11-09 ENCOUNTER — Other Ambulatory Visit: Payer: Self-pay | Admitting: Family Medicine

## 2021-11-09 DIAGNOSIS — I10 Essential (primary) hypertension: Secondary | ICD-10-CM

## 2021-11-09 DIAGNOSIS — R000 Tachycardia, unspecified: Secondary | ICD-10-CM

## 2021-11-11 ENCOUNTER — Ambulatory Visit (INDEPENDENT_AMBULATORY_CARE_PROVIDER_SITE_OTHER): Payer: 59 | Admitting: Family Medicine

## 2021-11-11 ENCOUNTER — Encounter: Payer: Self-pay | Admitting: Family Medicine

## 2021-11-11 ENCOUNTER — Other Ambulatory Visit: Payer: Self-pay | Admitting: Family Medicine

## 2021-11-11 ENCOUNTER — Other Ambulatory Visit: Payer: Self-pay

## 2021-11-11 VITALS — BP 84/59 | HR 86 | Temp 98.1°F | Ht 63.0 in | Wt 274.0 lb

## 2021-11-11 DIAGNOSIS — K7030 Alcoholic cirrhosis of liver without ascites: Secondary | ICD-10-CM | POA: Diagnosis not present

## 2021-11-11 DIAGNOSIS — R69 Illness, unspecified: Secondary | ICD-10-CM | POA: Diagnosis not present

## 2021-11-11 LAB — PROTIME-INR
INR: 1.3 ratio — ABNORMAL HIGH (ref 0.8–1.0)
Prothrombin Time: 13.6 s — ABNORMAL HIGH (ref 9.6–13.1)

## 2021-11-11 MED ORDER — PREGABALIN 75 MG PO CAPS: 75.0000 mg | ORAL_CAPSULE | Freq: Every evening | ORAL | 1 refills | Status: DC | PRN

## 2021-11-11 MED ORDER — QUETIAPINE FUMARATE 50 MG PO TABS: 50.0000 mg | ORAL_TABLET | Freq: Every day | ORAL | 1 refills | Status: DC

## 2021-11-11 MED ORDER — VALACYCLOVIR HCL 1 G PO TABS: ORAL_TABLET | ORAL | 1 refills | Status: AC

## 2021-11-11 MED ORDER — PREGABALIN 75 MG PO CAPS: 75.0000 mg | ORAL_CAPSULE | Freq: Two times a day (BID) | ORAL | 1 refills | Status: DC

## 2021-11-11 MED ORDER — PANTOPRAZOLE SODIUM 40 MG PO TBEC: 40.0000 mg | DELAYED_RELEASE_TABLET | Freq: Every day | ORAL | 1 refills | Status: DC

## 2021-11-11 MED ORDER — SPIRONOLACTONE 25 MG PO TABS: 25.0000 mg | ORAL_TABLET | Freq: Every day | ORAL | 0 refills | Status: DC

## 2021-11-11 MED ORDER — MELOXICAM 15 MG PO TABS: ORAL_TABLET | ORAL | 1 refills | Status: DC

## 2021-11-11 NOTE — Patient Instructions (Addendum)
° °  Changed this visit: Stopped Metoprolol, lactulose, folic acid, thiamine.  Decreased spirolactone to 25 mg a day. Increased lyrica to 1 tab before bed and 1 tab late afternoon.   Keep up the good work [name]!!!!    [place] to see you today.  I have refilled the medication(s) we provide.   If labs were collected, we will inform you of lab results once received either by echart message or telephone call.   - echart message- for normal results that have been seen by the patient already.   - telephone call: abnormal results or if patient has not viewed results in their echart.

## 2021-11-11 NOTE — Progress Notes (Signed)
This visit occurred during the SARS-CoV-2 public health emergency.  Safety protocols were in place, including screening questions prior to the visit, additional usage of staff PPE, and extensive cleaning of exam room while observing appropriate contact time as indicated for disinfecting solutions.    Brandi Kirk , Feb 01, 1986, 36 y.o., female MRN: [id number] Patient Care Team    Relationship Specialty Notifications Start End  [name], DO PCP - General Family Medicine  03/15/18   [name], MD Consulting Physician Obstetrics and Gynecology  03/15/18   Lucienne Capers, MD Referring Physician Internal Medicine  05/01/21     Chief Complaint  Patient presents with   Cirrhosis    Cmc; pt is fasting      Subjective: Brandi Kirk is a 36 y.o. female present for cmc and fatigue. Essential hypertension/Tremor/Tachycardia Pt reports compliance with Toprol 50 mg XL daily, spironolactone 50 mg daily, Lasix 40 mg prn- rarely requires now. Blood pressures ranges at home normal. Patient denies chest pain, shortness of breath, dizziness or lower extremity edema.   Vitamin B1 deficiency/Folate deficiency/vitamin D deficiency/B12 deficiency Patient reports she is currently taking B1 and folate she is uncertain if she is taking vitamin D at this time.  Insomnia, unspecified type/Depression with anxiety/Bipolar 1 disorder [facility]) Patient reports she is having great difficulty falling asleep now since she has been sober.  She has noted more restless leg symptoms.    She reports compliance with Seroquel 50 mg in nightly.  She reports compliance with Lyrica 75 mg nightly.  She does feel like she could still use more coverage at nighttime.  Of note she is also prescribed phentermine by another provider.   Chronic alcoholic gastritis without hemorrhage Reports her gastritis has been well controlled since she stopped drinking alcohol.  She is compliant with Protonix 40 mg daily.  Alcohol use  disorder, moderate, dependence (HCC)/Alcoholic hepatitis with ascites/Alcohol use disorder, severe, in early remission, dependence ([place]) Continues to maintain her sobriety.  Very proud of her.  Fatigue/back pain: Continues to take Mobic daily.  She is also working very hard with physical therapy and is making great improvements.  She has lost weight as well. Prior note: Patient reports she is severely fatigued over the last 2 months.  She feels like she never wakes with any energy.  She also endorses experiencing low back pain with activity, especially when standing for longer periods of time.  She reports the pain is across her lower back when it occurs.  She denies any radiating pain.  She has started physical therapy since her last visit and she does see an improvement in her strength and her flexibility.  She feels the Mobic has been helpful.  Depression screen [facility] 2/9 11/11/2021 08/12/2021 05/02/2020  Decreased Interest 0 1 1  Down, Depressed, Hopeless 1 1 1   PHQ - 2 Score 1 2 2   Altered sleeping 1 3 2   Tired, decreased energy 1 2 3   Change in appetite 1 1 3   Feeling bad or failure about yourself  1 1 1   Trouble concentrating 0 0 0  Moving slowly or fidgety/restless 0 2 1  Suicidal thoughts 0 0 0  PHQ-9 Score 5 11 12   Difficult doing work/chores - - Somewhat difficult  Some encounter information is confidential and restricted. Go to Review Flowsheets activity to see all data.    Allergies  Allergen Reactions   Ciprofloxacin Other (See Comments)    intolerance   Septra [Sulfamethoxazole-Trimethoprim] Other (See  Comments)    Thrush    Tylenol [Acetaminophen] Other (See Comments)    Patient stated she can't take it due to liver   Social History   Social History Narrative   Married. 1 child.    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    Alcohol abuse. Smoker.   Smoke alarm in the home   feels safe in her relationships.    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[place])    Depression    Fractured rib 2019   Frequent headaches    Frequent UTI    GERD (gastroesophageal reflux disease)    HPV in female    Hypertension    Hypomagnesemia 11/17/2017   Migraine    Multiple closed fractures of ribs of right side 03/15/2018   PCOS (polycystic ovarian syndrome)    Prediabetes    Vocal cord dysfunction    Past Surgical History:  Procedure Laterality Date   WISDOM TOOTH EXTRACTION     Family History  Problem Relation Age of Onset   Diabetes Mother    Stroke Mother 72   Hypertension Mother    Heart murmur Mother    Alcohol abuse Mother    Arthritis Mother    Depression Mother    Early death Mother    Hyperlipidemia Mother    Kidney disease Mother    Mental illness Mother    Epilepsy Mother    Rheum arthritis Sister    Heart murmur Sister    Alcohol abuse Sister    Bipolar disorder Sister    Cirrhosis Sister    Heart murmur Brother    Alcohol abuse Brother    Depression Brother    Mental illness Brother        bipolar   Alcohol abuse Father    Depression Father    Mental illness Father    Arthritis Maternal Grandmother    Birth defects Maternal Grandmother    Hyperlipidemia Maternal Grandmother    Hyperlipidemia Maternal Grandfather    Alcohol abuse Maternal Grandfather    Arthritis Maternal Grandfather    Diabetes Maternal Grandfather    Stroke Maternal Grandfather    Arthritis Paternal Grandmother    Arthritis Paternal Grandfather    Birth defects Brother    Mental illness Brother        bipolar   Depression Brother    Mental illness Brother    Allergies as of 11/11/2021       Reactions   Ciprofloxacin Other (See Comments)   intolerance   Septra  [sulfamethoxazole-trimethoprim] Other (See Comments)   Thrush   Tylenol [acetaminophen] Other (See Comments)   Patient stated she can't take it due to liver        Medication List        Accurate as of November 11, 2021  4:57 PM. If you have any questions, ask your nurse or doctor.          STOP taking these medications    folic acid 1 MG tablet Commonly known as: FOLVITE Stopped by: Howard Pouch, DO   lactulose 10 GM/15ML solution Commonly known as: [place] by: Howard Pouch, DO  metoprolol succinate 25 MG 24 hr tablet Commonly known as: TOPROL-XL Stopped by: Howard Pouch, DO   Qsymia 7.5-46 MG Cp24 Generic drug: Phentermine-Topiramate Stopped by: Howard Pouch, DO   thiamine 100 MG tablet Stopped by: Howard Pouch, DO       TAKE these medications    CALCIUM PO Take 1 tablet by mouth daily.   Cholecalciferol 25 MCG (1000 UT) tablet Take 1 tablet (1,000 Units total) by mouth daily.   cyanocobalamin 1000 MCG tablet Take 1,000 mcg by mouth daily. Sublingual tab   ferrous sulfate 324 MG Tbec Take 324 mg by mouth.   levonorgestrel 20 MCG/24HR IUD Commonly known as: MIRENA 1 each by Intrauterine route continuous.   meloxicam 15 MG tablet Commonly known as: MOBIC Once daily with food   multivitamin with minerals tablet Take 1 tablet by mouth daily.   pantoprazole 40 MG tablet Commonly known as: PROTONIX Take 1 tablet (40 mg total) by mouth daily.   pregabalin 75 MG capsule Commonly known as: Lyrica Take 1 capsule (75 mg total) by mouth 2 (two) times daily. What changed:  when to take this reasons to take this Changed by: Howard Pouch, DO   QUEtiapine 50 MG tablet Commonly known as: SEROQUEL Take 1-1.5 tablets (50-75 mg total) by mouth at bedtime. 1 tab p.o. in the morning and 2 tabs in the evening. What changed:  medication strength how much to take how to take this when to take this Changed by: Howard Pouch, DO    spironolactone 25 MG tablet Commonly known as: ALDACTONE Take 1 tablet (25 mg total) by mouth daily. What changed:  medication strength how much to take Changed by: Howard Pouch, DO   valACYclovir 1000 MG tablet Commonly known as: VALTREX 1g PO daily for 5 days, then 1/2 tab po qd x3 days prn Started by: Howard Pouch, DO        All past medical history, surgical history, allergies, family history, immunizations andmedications were updated in the EMR today and reviewed under the history and medication portions of their EMR.     ROS: Negative, with the exception of above mentioned in HPI   Objective:  BP (!) 84/59    Pulse 86    Temp 98.1 F (36.7 C) (Oral)    Ht 5' 3"  (1.6 m)    Wt 274 lb (124.3 kg)    SpO2 100%    BMI 48.54 kg/m  Body mass index is 48.54 kg/m. Physical Exam Vitals and nursing note reviewed.  Constitutional:      General: She is not in acute distress.    Appearance: Normal appearance. She is obese. She is not ill-appearing, toxic-appearing or diaphoretic.  HENT:     Head: Normocephalic and atraumatic.  Eyes:     General: No scleral icterus.       Right eye: No discharge.        Left eye: No discharge.     Extraocular Movements: Extraocular movements intact.     Conjunctiva/sclera: Conjunctivae normal.     Pupils: Pupils are equal, round, and reactive to light.  Cardiovascular:     Rate and Rhythm: Normal rate and regular rhythm.  Pulmonary:     Effort: Pulmonary effort is normal. No respiratory distress.     Breath sounds: Normal breath sounds. No wheezing, rhonchi or rales.  Musculoskeletal:     Cervical back: Neck supple. No tenderness.     Right lower leg: No edema.     Left lower  leg: No edema.  Lymphadenopathy:     Cervical: No cervical adenopathy.  Skin:    General: Skin is warm and dry.     Coloration: Skin is not jaundiced or pale.     Findings: No erythema or rash.  Neurological:     Mental Status: She is alert and oriented to  person, place, and time. Mental status is at baseline.     Motor: No weakness.     Gait: Gait normal.  Psychiatric:        Mood and Affect: Mood normal.        Behavior: Behavior normal.        Thought Content: Thought content normal.        Judgment: Judgment normal.     No results found. No results found. Results for orders placed or performed in visit on 11/11/21 (from the past 24 hour(s))  Protime-INR     Status: Abnormal   Collection Time: 11/11/21  9:35 AM  Result Value Ref Range   INR 1.3 (H) 0.8 - 1.0 ratio   Prothrombin Time 13.6 (H) 9.6 - 13.1 sec    Assessment/Plan: Brandi Kirk is a 36 y.o. female present for OV for  Essential hypertension/Tremor/Tachycardia Stable Discontinue metoprolol 25 mg daily- Decrease spironolactone  25 mg qd.  We will attempt to continue to decrease dose if able. Discontinue Lasix 40 mg daily, can keep on as a as needed for now. Low-sodium diet. LFTs and PT/INR collected today  Vitamin B1 deficiency/Folate deficiency/vitamin D deficiency/B12 deficiency Discontinue thiamine supplement as long as keep sobriety. Discontinue folate as long as maintain sobriety. Continue B12 Continue vitamin D supplementation  Insomnia, unspecified type/Depression with anxiety/Bipolar 1 disorder ([place]) She is improvement, although she is also taking phentermine which could be playing a role in her restless leg and insomnia not improving. Continue Seroquel 50 mg nightly, can increase to 75 mg nightly if needed. Increase Lyrica 75 mg nightly, to twice daily.  Chronic alcoholic gastritis without hemorrhage Continue Protonix 40 mg daily.  Alcohol use disorder, moderate, dependence (HCC)/Alcoholic hepatitis with ascites/Alcohol use disorder, severe, in early remission, dependence ([place]) -She has maintained sobriety.  Extremely happy for her.   - Lasix PRN -DC lactulose -est w/ gastroenterology   Lumbar back pain: Improving.  Continue  physical  therapy Continue Mobic 15 mg daily   Reviewed expectations re: course of current medical issues. Discussed self-management of symptoms. Outlined signs and symptoms indicating need for more acute intervention. Patient verbalized understanding and all questions were answered. Patient received an After-Visit Summary.    Orders Placed This Encounter  Procedures   Hepatic function panel   Protime-INR     Meds ordered this encounter  Medications   spironolactone (ALDACTONE) 25 MG tablet    Sig: Take 1 tablet (25 mg total) by mouth daily.    Dispense:  90 tablet    Refill:  0    Discontinue all higher doses.   meloxicam (MOBIC) 15 MG tablet    Sig: Once daily with food    Dispense:  90 tablet    Refill:  1   pantoprazole (PROTONIX) 40 MG tablet    Sig: Take 1 tablet (40 mg total) by mouth daily.    Dispense:  90 tablet    Refill:  1   valACYclovir (VALTREX) 1000 MG tablet    Sig: 1g PO daily for 5 days, then 1/2 tab po qd x3 days prn    Dispense:  30 tablet  Refill:  1   DISCONTD: pregabalin (LYRICA) 75 MG capsule    Sig: Take 1 capsule (75 mg total) by mouth at bedtime as needed.    Dispense:  180 capsule    Refill:  1   QUEtiapine (SEROQUEL) 50 MG tablet    Sig: Take 1-1.5 tablets (50-75 mg total) by mouth at bedtime. 1 tab p.o. in the morning and 2 tabs in the evening.    Dispense:  135 tablet    Refill:  1   pregabalin (LYRICA) 75 MG capsule    Sig: Take 1 capsule (75 mg total) by mouth 2 (two) times daily.    Dispense:  180 capsule    Refill:  1      Referral Orders  No referral(s) requested today       Note is dictated utilizing voice recognition software. Although note has been proof read prior to signing, occasional typographical errors still can be missed. If any questions arise, please do not hesitate to call for verification.   electronically signed by:  Howard Pouch, DO  [place]

## 2021-11-12 ENCOUNTER — Telehealth: Payer: Self-pay

## 2021-11-12 ENCOUNTER — Encounter: Payer: Self-pay | Admitting: Family Medicine

## 2021-11-12 DIAGNOSIS — G622 Polyneuropathy due to other toxic agents: Secondary | ICD-10-CM

## 2021-11-12 LAB — HEPATIC FUNCTION PANEL
ALT: 28 U/L (ref 0–35)
AST: 21 U/L (ref 0–37)
Albumin: 4.4 g/dL (ref 3.5–5.2)
Alkaline Phosphatase: 101 U/L (ref 39–117)
Bilirubin, Direct: 0.1 mg/dL (ref 0.0–0.3)
Total Bilirubin: 0.7 mg/dL (ref 0.2–1.2)
Total Protein: 7.4 g/dL (ref 6.0–8.3)

## 2021-11-12 MED ORDER — QUETIAPINE FUMARATE 50 MG PO TABS: 50.0000 mg | ORAL_TABLET | Freq: Every day | ORAL | 1 refills | Status: DC

## 2021-11-12 NOTE — Telephone Encounter (Signed)
Faxed received asking for clarification on QUEtiapine (SEROQUEL) 50 MG tablet. 2 set of scripts.

## 2021-11-13 ENCOUNTER — Telehealth: Payer: Self-pay | Admitting: Family Medicine

## 2021-11-13 NOTE — Telephone Encounter (Signed)
Spoke with pt to inform her that she needs a PA. Pt states that she will need took last med today and want to know if she can take leftover gabapentin in the mean time.

## 2021-11-13 NOTE — Telephone Encounter (Signed)
With prior auth please associate neuropathy with chemical substance.  Yes she can switch back to gabapentin at the dose she had been taken before we switched to lyrica.

## 2021-11-13 NOTE — Telephone Encounter (Signed)
Please inform patient her liver enzymes are now completely normal. Her PT/INR are mildly elevated, this is from the chronic liver damage and is not a concern at this level.

## 2021-11-13 NOTE — Telephone Encounter (Signed)
Spoke with pt regarding labs and instructions.   

## 2021-11-14 NOTE — Telephone Encounter (Signed)
Brandi Kirk (Key: [id number]) Rx #: [id number] Pregabalin 75MG capsules

## 2021-11-14 NOTE — Telephone Encounter (Signed)
Spoke with pt husband for pt to restart gabapentin while we await PA for lyrica .

## 2021-11-14 NOTE — Telephone Encounter (Signed)
Status: PA Response - Approved

## 2021-11-18 ENCOUNTER — Other Ambulatory Visit: Payer: Self-pay | Admitting: Family Medicine

## 2021-11-20 DIAGNOSIS — Z0289 Encounter for other administrative examinations: Secondary | ICD-10-CM

## 2021-11-21 ENCOUNTER — Encounter (INDEPENDENT_AMBULATORY_CARE_PROVIDER_SITE_OTHER): Payer: Self-pay

## 2021-11-27 ENCOUNTER — Encounter (INDEPENDENT_AMBULATORY_CARE_PROVIDER_SITE_OTHER): Payer: Self-pay | Admitting: Family Medicine

## 2021-11-27 ENCOUNTER — Other Ambulatory Visit: Payer: Self-pay

## 2021-11-27 ENCOUNTER — Ambulatory Visit (INDEPENDENT_AMBULATORY_CARE_PROVIDER_SITE_OTHER): Payer: 59 | Admitting: Family Medicine

## 2021-11-27 VITALS — BP 114/73 | HR 74 | Temp 97.9°F | Ht 63.0 in | Wt 262.0 lb

## 2021-11-27 DIAGNOSIS — E538 Deficiency of other specified B group vitamins: Secondary | ICD-10-CM

## 2021-11-27 DIAGNOSIS — R0602 Shortness of breath: Secondary | ICD-10-CM | POA: Diagnosis not present

## 2021-11-27 DIAGNOSIS — E559 Vitamin D deficiency, unspecified: Secondary | ICD-10-CM

## 2021-11-27 DIAGNOSIS — R7303 Prediabetes: Secondary | ICD-10-CM

## 2021-11-27 DIAGNOSIS — Z6841 Body Mass Index (BMI) 40.0 and over, adult: Secondary | ICD-10-CM

## 2021-11-27 DIAGNOSIS — R5383 Other fatigue: Secondary | ICD-10-CM | POA: Diagnosis not present

## 2021-11-27 DIAGNOSIS — Z9189 Other specified personal risk factors, not elsewhere classified: Secondary | ICD-10-CM

## 2021-11-27 DIAGNOSIS — Z1331 Encounter for screening for depression: Secondary | ICD-10-CM | POA: Diagnosis not present

## 2021-11-27 DIAGNOSIS — R69 Illness, unspecified: Secondary | ICD-10-CM | POA: Diagnosis not present

## 2021-11-27 DIAGNOSIS — F39 Unspecified mood [affective] disorder: Secondary | ICD-10-CM

## 2021-11-27 DIAGNOSIS — E569 Vitamin deficiency, unspecified: Secondary | ICD-10-CM | POA: Diagnosis not present

## 2021-11-27 DIAGNOSIS — E669 Obesity, unspecified: Secondary | ICD-10-CM
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
## 2021-11-27 NOTE — Progress Notes (Signed)
Chief Complaint:   OBESITY Brandi Kirk (MR# [id number]) is a 36 y.o. female who presents for evaluation and treatment of obesity and related comorbidities. Current BMI is Body mass index is 46.41 kg/m. [name] has been struggling with her weight for many years and has been unsuccessful in either losing weight, maintaining weight loss, or reaching her healthy weight goal.  [name] is currently in the action stage of change and ready to dedicate time achieving and maintaining a healthier weight. [name] is interested in becoming our patient and working on intensive lifestyle modifications including (but not limited to) diet and exercise for weight loss.  [name]'s habits were reviewed today and are as follows: Her family eats meals together, she thinks her family will eat healthier with her, her desired weight loss is 102 lbs, she started gaining weight after pregnancy, her heaviest weight ever was 296 pounds, she has significant food cravings issues, she snacks frequently in the evenings, she skips meals frequently, she is frequently drinking liquids with calories, she frequently makes poor food choices, she has problems with excessive hunger, she frequently eats larger portions than normal, and she struggles with emotional eating.  Depression Screen [name]'s Food and Mood (modified PHQ-9) score was 13.  Depression screen PHQ 2/9 11/27/2021  Decreased Interest 2  Down, Depressed, Hopeless 2  PHQ - 2 Score 4  Altered sleeping 1  Tired, decreased energy 3  Change in appetite 2  Feeling bad or failure about yourself  1  Trouble concentrating 1  Moving slowly or fidgety/restless 1  Suicidal thoughts 0  PHQ-9 Score 13  Difficult doing work/chores Somewhat difficult  Some encounter information is confidential and restricted. Go to Review Flowsheets activity to see all data.  Some recent data might be hidden     Subjective:   1. Other fatigue Brandi Kirk admits to daytime somnolence and  admits to waking up still tired. Patient has a history of symptoms of daytime fatigue, morning fatigue, and morning headache. Brandi Kirk generally gets 6 hours of sleep per night, and states that she has generally restful sleep. Snoring is present. Apneic episodes are present. Epworth Sleepiness Score is 4.   2. SOBOE (shortness of breath on exertion) Brandi Kirk notes increasing shortness of breath with exercising and seems to be worsening over time with weight gain. She notes getting out of breath sooner with activity than she used to. This has gotten worse recently. Brandi Kirk denies shortness of breath at rest or orthopnea.  3. Vitamin D deficiency Brandi Kirk is not taking a Vit D supplement currently. She is taking a daily multivitamin that contains Vit D.   4. Vitamin B12 deficiency Medication: OTC B12 2000 mcg QD  5. Mood disorder, with emotional eating ([place]) Pt with depression, anxiety, bipolar, and alcohol abuse.  -- PCP, Dr. Raoul Pitch, manages pt's bipolar.  - Pt has a h/o bulimia and BED in the past. She is now in recovery and has no symptoms currently.  6. Prediabetes Pt was first diagnosed 10 years ago. She is not on meds currently.  7. At risk of diabetes mellitus Brandi Kirk is at higher than average risk for developing diabetes due to her obesity and pre-diabetes.    Assessment/Plan:   Orders Placed This Encounter  Procedures   Vitamin B12   CBC with Differential/Platelet   Comprehensive metabolic panel   Folate   Hemoglobin A1c   Insulin, random   Lipid Panel With LDL/HDL Ratio   T4, free   TSH  VITAMIN D 25 Hydroxy (Vit-D Deficiency, Fractures)   EKG 12-Lead    Medications Discontinued During This Encounter  Medication Reason   ferrous sulfate 324 MG TBEC Patient Preference     1. Other fatigue Brandi Kirk does feel that her weight is causing her energy to be lower than it should be. Fatigue may be related to obesity, depression or many other causes. Labs will be ordered, and in  the meanwhile, Brandi Kirk will focus on self care including making healthy food choices, increasing physical activity and focusing on stress reduction. Check labs today.  - EKG 12-Lead - Lipid Panel With LDL/HDL Ratio  2. SOBOE (shortness of breath on exertion) Brandi Kirk does feel that she gets out of breath more easily that she used to when she exercises. Brandi Kirk's shortness of breath appears to be obesity related and exercise induced. She has agreed to work on weight loss and gradually increase exercise to treat her exercise induced shortness of breath. Will continue to monitor closely.  3. Vitamin D deficiency Low Vitamin D level contributes to fatigue and are associated with obesity, breast, and colon cancer. She will follow-up for routine testing of Vitamin D, at least 2-3 times per year to avoid over-replacement. Check labs today.  - CBC with Differential/Platelet - VITAMIN D 25 Hydroxy (Vit-D Deficiency, Fractures)  4. Vitamin B12 deficiency The diagnosis was reviewed with the patient. Counseling provided today, see below. We will continue to monitor. Orders and follow up as documented in patient record.  Counseling The body needs vitamin B12: to make red blood cells; to make DNA; and to help the nerves work properly so they can carry messages from the brain to the body.  The main causes of vitamin B12 deficiency include dietary deficiency, digestive diseases, pernicious anemia, and having a surgery in which part of the stomach or small intestine is removed.  Certain medicines can make it harder for the body to absorb vitamin B12. These medicines include: heartburn medications; some antibiotics; some medications used to treat diabetes, gout, and high cholesterol.  In some cases, there are no symptoms of this condition. If the condition leads to anemia or nerve damage, various symptoms can occur, such as weakness or fatigue, shortness of breath, and numbness or tingling in your hands and feet.    Treatment:  May include taking vitamin B12 supplements.  Avoid alcohol.  Eat lots of healthy foods that contain vitamin B12: Beef, pork, chicken, [place], and organ meats, such as liver.  Seafood: This includes clams, rainbow trout, salmon, tuna, and haddock. Eggs.  Cereal and dairy products that are fortified: This means that vitamin B12 has been added to the food.  Check labs today.  - Vitamin B12 - Folate  5. Mood disorder, with emotional eating ([place]) Pt's mood is very stable but pt is still with emotional eating due to boredom, stress, and comfort. Referral to Dr. Mallie Mussel. Continue medication per PCP. Check labs today.  - Comprehensive metabolic panel - Lipid Panel With LDL/HDL Ratio - T4, free - TSH  6. Prediabetes Yasuko will continue to work on weight loss, exercise, and decreasing simple carbohydrates to help decrease the risk of diabetes. Check labs today.  - Hemoglobin A1c - Insulin, random  7. Depression screening Belvia had a positive depression screening. Depression is commonly associated with obesity and often results in emotional eating behaviors. We will monitor this closely and work on CBT to help improve the non-hunger eating patterns. Referral to Psychology may be required if no improvement  is seen as she continues in our clinic.  8. At risk of diabetes mellitus - Taleeya was given diabetes prevention education and counseling today of more than 23 minutes.  - Counseled patient on pathophysiology of disease and meaning/ implication of lab results.  - Reviewed how certain foods can either stimulate or inhibit insulin release, and subsequently affect hunger pathways  - Importance of following a healthy meal plan with limiting amounts of simple carbohydrates discussed with patient - Effects of regular aerobic exercise on blood sugar regulation reviewed and encouraged an eventual goal of 30 min 5d/week or more as a minimum.  - Briefly discussed treatment options, which  always include dietary and lifestyle modification as first line.   - Handouts provided at patient's desire and/or told to go online to the American Diabetes Association website for further information.  9. Obesity with current BMI of 46.6 Charish is currently in the action stage of change and her goal is to continue with weight loss efforts. I recommend Breslin begin the structured treatment plan as follows:  She has agreed to the Category 1 Plan and keeping a food journal and adhering to recommended goals of 1000-1100 calories and 80+ grams protein.  Exercise goals:  As is    Behavioral modification strategies: increasing lean protein intake, decreasing simple carbohydrates, and planning for success.  She was informed of the importance of frequent follow-up visits to maximize her success with intensive lifestyle modifications for her multiple health conditions. She was informed we would discuss her lab results at her next visit unless there is a critical issue that needs to be addressed sooner. Sherryn agreed to keep her next visit at the agreed upon time to discuss these results.  Objective:   Blood pressure 114/73, pulse 74, temperature 97.9 F (36.6 C), height 5' 3"  (1.6 m), weight 262 lb (118.8 kg), SpO2 98 %. Body mass index is 46.41 kg/m.  EKG: Abnormal- Sinus rhythm, rate 78.  Indirect Calorimeter completed today shows a VO2 of 237 and a REE of 1627.  Her calculated basal metabolic rate is 8250 thus her basal metabolic rate is worse than expected.  General: Cooperative, alert, well developed, in no acute distress. HEENT: Conjunctivae and lids unremarkable. Cardiovascular: Regular rhythm.  Lungs: Normal work of breathing. Neurologic: No focal deficits.   Lab Results  Component Value Date   CREATININE 0.80 07/17/2021   BUN 12 07/17/2021   NA 137 07/17/2021   K 3.6 07/17/2021   CL 98 07/17/2021   CO2 28 07/17/2021   Lab Results  Component Value Date   ALT 28 11/11/2021    AST 21 11/11/2021   ALKPHOS 101 11/11/2021   BILITOT 0.7 11/11/2021   Lab Results  Component Value Date   HGBA1C 5.3 04/20/2018   HGBA1C 5.2 11/25/2016   No results found for: INSULIN Lab Results  Component Value Date   TSH 1.75 08/12/2021   Lab Results  Component Value Date   CHOL 195 05/02/2020   HDL 40.50 05/02/2020   LDLCALC 118 (H) 05/02/2020   TRIG 182.0 (H) 05/02/2020   CHOLHDL 5 05/02/2020   Lab Results  Component Value Date   WBC 9.3 08/12/2021   HGB 13.6 08/12/2021   HCT 40.8 08/12/2021   MCV 88.9 08/12/2021   PLT 302.0 08/12/2021   Lab Results  Component Value Date   IRON 70 07/17/2021   TIBC 303 07/17/2021   FERRITIN 124 07/17/2021    Attestation Statements:   Reviewed by clinician on  day of visit: allergies, medications, problem list, medical history, surgical history, family history, social history, and previous encounter notes.  Coral Ceo, CMA, am acting as transcriptionist for [employer], DO.  I have reviewed the above documentation for accuracy and completeness, however, I was never given my hand written chart to verify information.  Marjory Sneddon, D.O.  [place] was signed into law in 2016 which includes the topic of electronic health records.  This provides immediate access to information in MyChart.  This includes consultation notes, operative notes, office notes, lab results and pathology reports.  If you have any questions about what you read please let us know at your next visit so we can discuss your concerns and take corrective action if need be.  We are right here with you.

## 2021-11-28 LAB — LIPID PANEL WITH LDL/HDL RATIO
Cholesterol, Total: 161 mg/dL (ref 100–199)
HDL: 36 mg/dL — ABNORMAL LOW (ref >39)
LDL Chol Calc (NIH): 109 mg/dL — ABNORMAL HIGH (ref 0–99)
LDL/HDL Ratio: 3 ratio (ref 0.0–3.2)
Triglycerides: 86 mg/dL (ref 0–149)
VLDL Cholesterol Cal: 16 mg/dL (ref 5–40)

## 2021-11-28 LAB — FOLATE: Folate: >20 ng/mL (ref >3.0)

## 2021-11-28 LAB — CBC WITH DIFFERENTIAL/PLATELET
Basophils Absolute: 0 10*3/uL (ref 0.0–0.2)
Basos: 1 %
EOS (ABSOLUTE): 0.1 10*3/uL (ref 0.0–0.4)
Eos: 2 %
Hematocrit: 40.6 % (ref 34.0–46.6)
Hemoglobin: 13.9 g/dL (ref 11.1–15.9)
Immature Grans (Abs): 0 10*3/uL (ref 0.0–0.1)
Immature Granulocytes: 0 %
Lymphocytes Absolute: 2.2 10*3/uL (ref 0.7–3.1)
Lymphs: 32 %
MCH: 30.6 pg (ref 26.6–33.0)
MCHC: 34.2 g/dL (ref 31.5–35.7)
MCV: 89 fL (ref 79–97)
Monocytes Absolute: 0.5 10*3/uL (ref 0.1–0.9)
Monocytes: 7 %
Neutrophils Absolute: 4.1 10*3/uL (ref 1.4–7.0)
Neutrophils: 58 %
Platelets: 287 10*3/uL (ref 150–450)
RBC: 4.54 x10E6/uL (ref 3.77–5.28)
RDW: 12 % (ref 11.7–15.4)
WBC: 6.9 10*3/uL (ref 3.4–10.8)

## 2021-11-28 LAB — COMPREHENSIVE METABOLIC PANEL
ALT: 33 IU/L — ABNORMAL HIGH (ref 0–32)
AST: 24 IU/L (ref 0–40)
Albumin/Globulin Ratio: 1.5 (ref 1.2–2.2)
Albumin: 4.5 g/dL (ref 3.8–4.8)
Alkaline Phosphatase: 125 IU/L — ABNORMAL HIGH (ref 44–121)
BUN/Creatinine Ratio: 10 (ref 9–23)
BUN: 8 mg/dL (ref 6–20)
Bilirubin Total: 0.9 mg/dL (ref 0.0–1.2)
CO2: 20 mmol/L (ref 20–29)
Calcium: 9.8 mg/dL (ref 8.7–10.2)
Chloride: 102 mmol/L (ref 96–106)
Creatinine, Ser: 0.8 mg/dL (ref 0.57–1.00)
Globulin, Total: 3.1 g/dL (ref 1.5–4.5)
Glucose: 88 mg/dL (ref 70–99)
Potassium: 4.5 mmol/L (ref 3.5–5.2)
Sodium: 137 mmol/L (ref 134–144)
Total Protein: 7.6 g/dL (ref 6.0–8.5)
eGFR: 100 mL/min/{1.73_m2} (ref >59)

## 2021-11-28 LAB — T4, FREE: Free T4: 1.17 ng/dL (ref 0.82–1.77)

## 2021-11-28 LAB — HEMOGLOBIN A1C
Est. average glucose Bld gHb Est-mCnc: 105 mg/dL
Hgb A1c MFr Bld: 5.3 % (ref 4.8–5.6)

## 2021-11-28 LAB — TSH: TSH: 1.03 u[IU]/mL (ref 0.450–4.500)

## 2021-11-28 LAB — VITAMIN B12: Vitamin B-12: 1432 pg/mL — ABNORMAL HIGH (ref 232–1245)

## 2021-11-28 LAB — INSULIN, RANDOM: INSULIN: 9.2 u[IU]/mL (ref 2.6–24.9)

## 2021-11-28 LAB — VITAMIN D 25 HYDROXY (VIT D DEFICIENCY, FRACTURES): Vit D, 25-Hydroxy: 38.2 ng/mL (ref 30.0–100.0)

## 2021-12-10 ENCOUNTER — Telehealth (INDEPENDENT_AMBULATORY_CARE_PROVIDER_SITE_OTHER): Payer: 59 | Admitting: Psychology

## 2021-12-10 DIAGNOSIS — F4320 Adjustment disorder, unspecified: Secondary | ICD-10-CM

## 2021-12-10 DIAGNOSIS — F5089 Other specified eating disorder: Secondary | ICD-10-CM | POA: Diagnosis not present

## 2021-12-10 DIAGNOSIS — R69 Illness, unspecified: Secondary | ICD-10-CM | POA: Diagnosis not present

## 2021-12-10 NOTE — Progress Notes (Signed)
?  Office: [phone]  /  Fax: [phone] ? ? ? ?Date: 12/24/2021   ?Appointment Start Time: 2:05pm ?Duration: 33 minutes ?Provider: Glennie Isle, Psy.D. ?Type of Session: Individual Therapy  ?Location of Patient: Home (private location) ?Location of Provider: Provider's Home (private office) ?Type of Contact: Telepsychological Visit via MyChart Video Visit ? ?Session Content: This provider called Brandi Kirk at 2:03pm as she did not present for today's appointment. She shared she mixed up her schedule, but would be able to join the appointment. As such, today's appointment was initiated 5 minutes late. Brandi Kirk is a 36 y.o. female presenting for a follow-up appointment to address the previously established treatment goal of increasing coping skills.Today's appointment was a telepsychological visit due to COVID-19. Brandi Kirk provided verbal consent for today's telepsychological appointment and she is aware she is responsible for securing confidentiality on her end of the session. Prior to proceeding with today's appointment, Brandi Kirk's physical location at the time of this appointment was obtained as well a phone number she could be reached at in the event of technical difficulties. Brandi Kirk and this provider participated in today's telepsychological service.  ? ?This provider conducted a brief check-in. Brandi Kirk shared she has "hit a big stall" with her eating habits and weight loss. Additionally, she shared her Seroquel prescription was increased by her PCP due to an increase in OCD-related symptoms (e.g., finger tapping, excessive cleaning), noting it "makes [her] extremely hungry." Regarding establishing care with a therapist and psychiatric provider, she indicated it may not be feasible based on insurance/finances. She also expressed ambivalence about attending therapeutic services. Further explored and processed. Remainder of today's appointment focused on coping with urges/cravings for food. Brandi Kirk was engaged in problem  solving to develop a plan to help cope with urges/cravings involving activities to relax, activities to distract, comforting places, people to call and connect with, and activities that help soothe senses. She was observed writing the plan. Despite recent challenges/stressors, she denied experiencing urges to consume alcohol. Overall, Brandi Kirk was receptive to today's appointment as evidenced by openness to sharing, responsiveness to feedback, and willingness to implement discussed strategies . ? ?Mental Status Examination:  ?Appearance: neat ?Behavior: appropriate to circumstances ?Mood: anxious ?Affect: mood congruent ?Speech: intermittent  ?Eye Contact: appropriate ?Psychomotor Activity: WNL ?Gait: unable to assess ?Thought Process: linear, logical, and goal directed and denies suicidal, homicidal, and self-harm ideation, plan and intent  ?Thought Content/Perception: no hallucinations, delusions, bizarre thinking or behavior endorsed or observed ?Orientation: AAOx4 ?Memory/Concentration: memory, attention, language, and fund of knowledge intact  ?Insight: fair ?Judgment: fair ? ?Interventions:  ?Conducted a brief chart review ?Conducted a risk assessment ?Provided empathic reflections and validation ?Employed supportive psychotherapy interventions to facilitate reduced distress and to improve coping skills with identified stressors ?Engaged patient in problem solving ? ?DSM-5 Diagnosis(es): F50.89 Other Specified Feeding or Eating Disorder, Emotional Eating Behaviors and  F43.20 Adjustment Disorder, Unspecified  ? ?Treatment Goal & Progress: During the initial appointment with this provider, the following treatment goal was established: increase coping skills. Progress is limited, as Brandi Kirk has just begun treatment with this provider; however, she is receptive to the interaction and interventions and rapport is being established.  ? ?Plan: The next appointment will be scheduled in two weeks, which will be via  MyChart Video Visit. The next session will focus on working towards the established treatment goal. ? ?

## 2021-12-11 ENCOUNTER — Ambulatory Visit (INDEPENDENT_AMBULATORY_CARE_PROVIDER_SITE_OTHER): Payer: 59 | Admitting: Family Medicine

## 2021-12-11 ENCOUNTER — Telehealth (INDEPENDENT_AMBULATORY_CARE_PROVIDER_SITE_OTHER): Payer: Self-pay | Admitting: Family Medicine

## 2021-12-11 ENCOUNTER — Encounter (INDEPENDENT_AMBULATORY_CARE_PROVIDER_SITE_OTHER): Payer: Self-pay | Admitting: Family Medicine

## 2021-12-11 NOTE — Telephone Encounter (Signed)
Patient left voicemail message with [facility] that she need a refill on Qsymia. ?

## 2021-12-11 NOTE — Telephone Encounter (Signed)
Please see previous message sent to patient dated 12/11/21, pt had called in.   ?

## 2021-12-24 ENCOUNTER — Telehealth (INDEPENDENT_AMBULATORY_CARE_PROVIDER_SITE_OTHER): Payer: 59 | Admitting: Psychology

## 2021-12-24 DIAGNOSIS — F4320 Adjustment disorder, unspecified: Secondary | ICD-10-CM

## 2021-12-24 DIAGNOSIS — R69 Illness, unspecified: Secondary | ICD-10-CM | POA: Diagnosis not present

## 2021-12-24 DIAGNOSIS — F5089 Other specified eating disorder: Secondary | ICD-10-CM | POA: Diagnosis not present

## 2021-12-24 IMAGING — DX DG LUMBAR SPINE COMPLETE 4+V
5 series · 5 of 5 positions shown · non-contrast
Comparison: None.

CLINICAL DATA: Lumbar pain. Low back pain radiating to the right
hip. No known recent injury. Remote fall down stairs 1 year ago.

EXAM:
LUMBAR SPINE - COMPLETE 4+ VIEW

[l-spine ap]
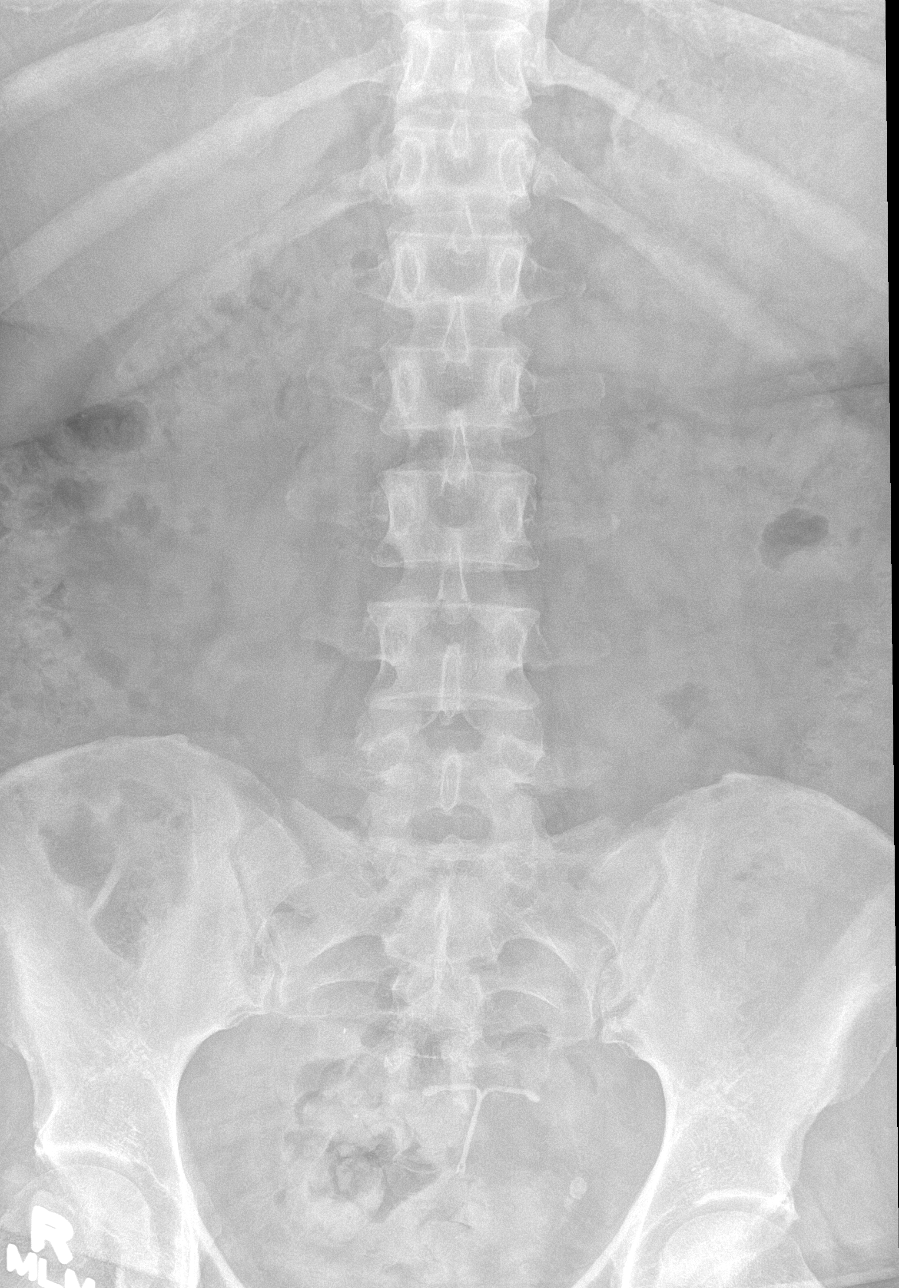

[l-spine obl (1 of 2)]
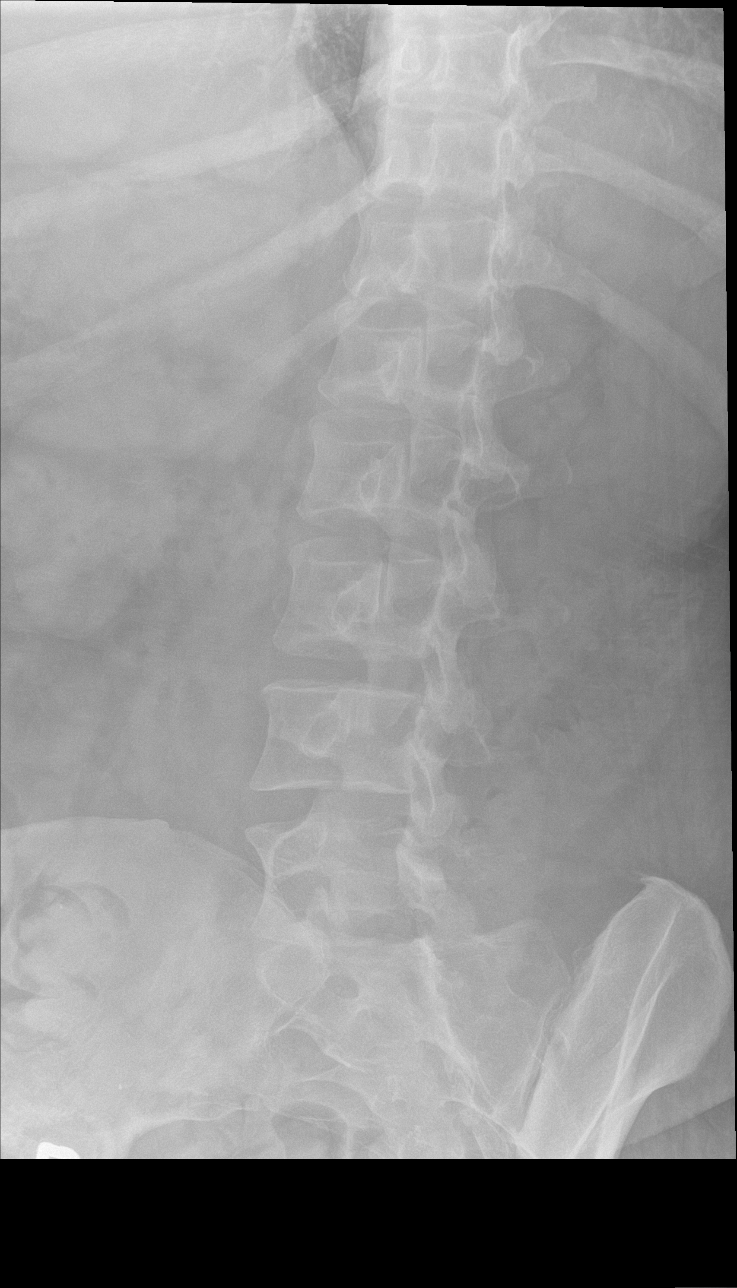

[l-spine obl (2 of 2)]
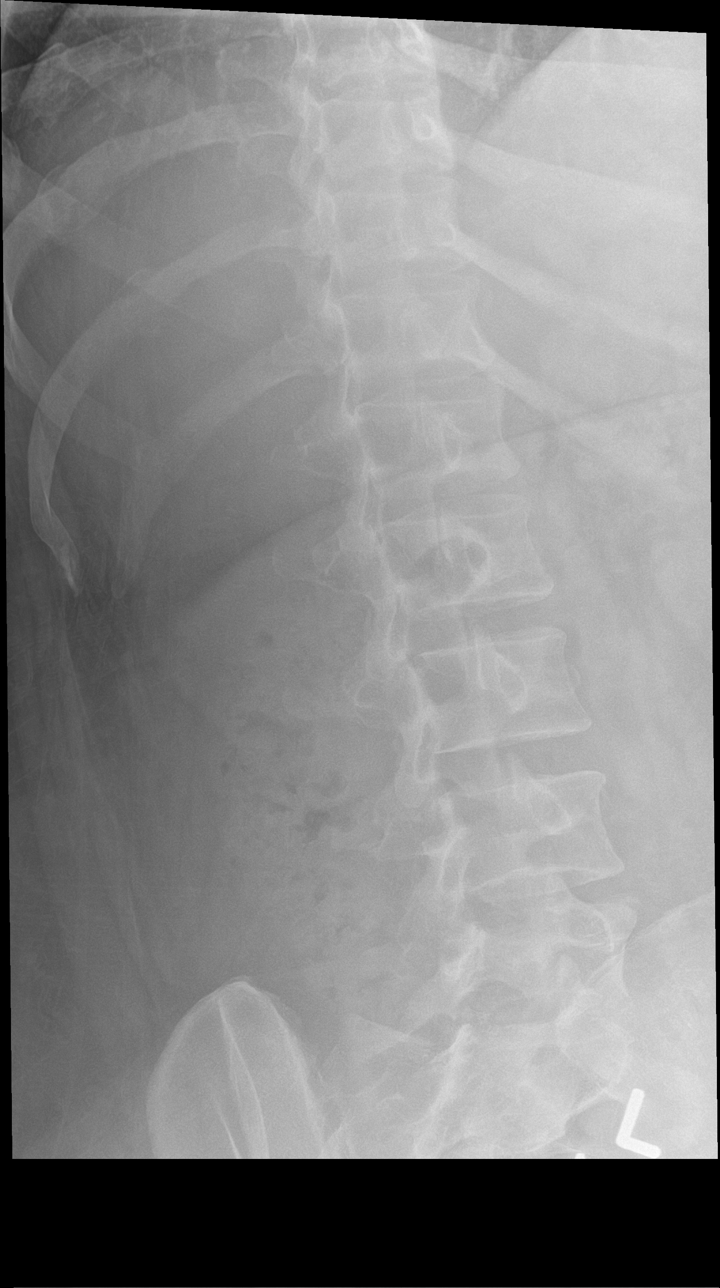

[l-spine lat]
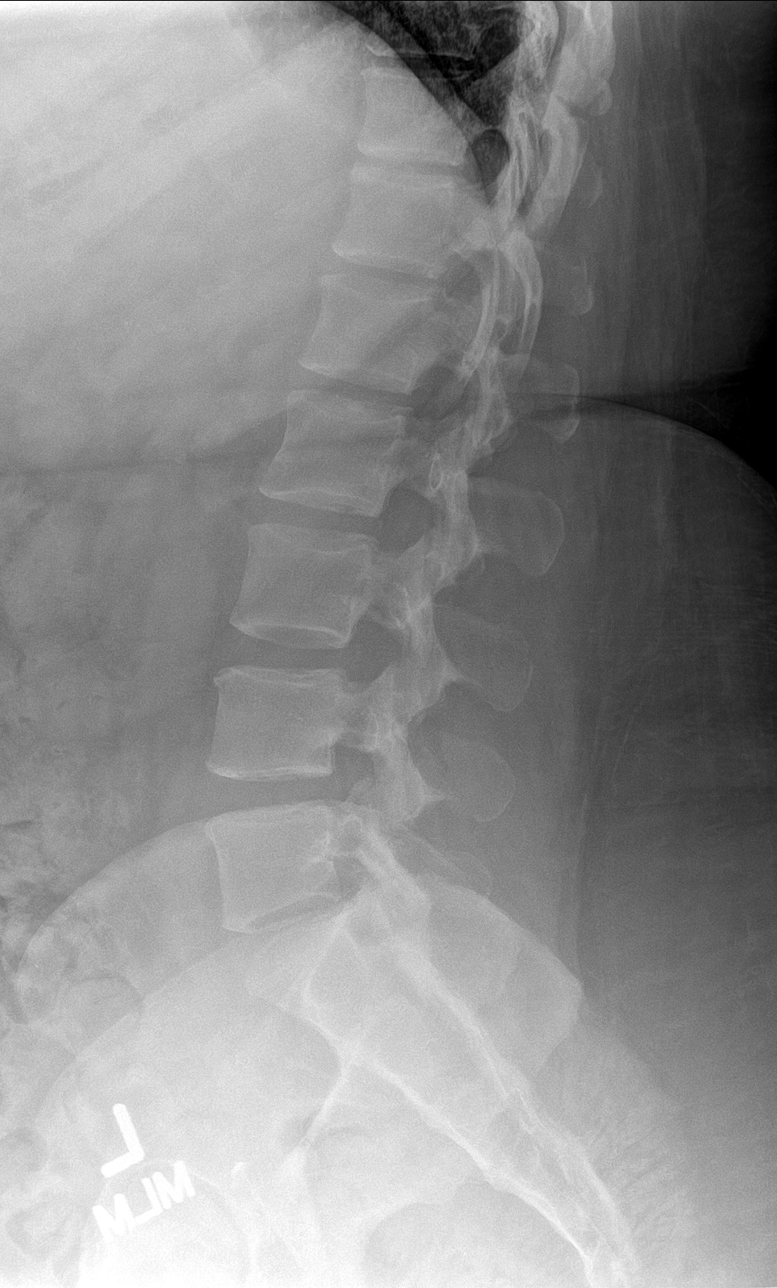

[l-spine spot]
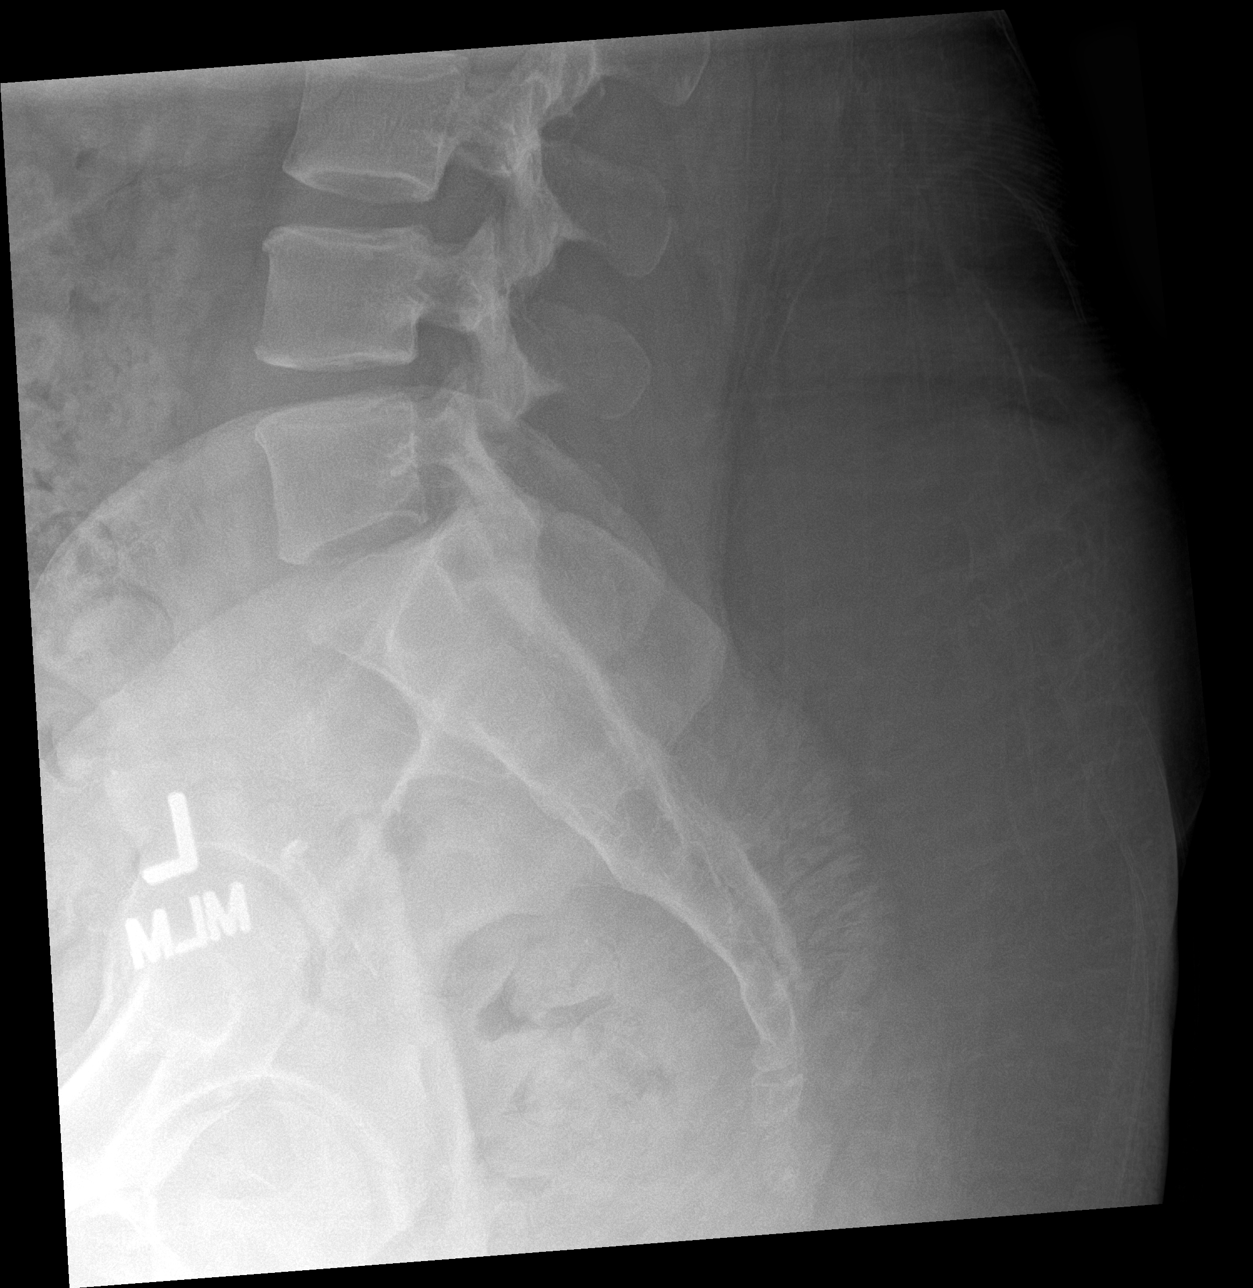

[5 of 5 positions shown; findings below may reference images not displayed]

FINDINGS: There are 5 lumbar type vertebra., visualized the alignment is
maintained. Vertebral body heights are normal. There is no
listhesis. The posterior elements are intact. Disc spaces are
preserved. No fracture pars defects or focal bone abnormality.
Sacroiliac joints are symmetric and normal. There is an IUD in the
pelvis.
IMPRESSION: Negative radiographs of the lumbar spine.

## 2021-12-24 NOTE — Progress Notes (Signed)
?Office: [phone]  /  Fax: [phone] ? ? ? ?Date: 01/07/2022   ?Appointment Start Time: 4:01pm ?Duration: 31 minutes ?Provider: Glennie Isle, Psy.D. ?Type of Session: Individual Therapy  ?Location of Patient: Home (private location) ?Location of Provider: Provider's Home (private office) ?Type of Contact: Telepsychological Visit via MyChart Video Visit ? ?Session Content: Brandi Kirk is a 36 y.o. female presenting for a follow-up appointment to address the previously established treatment goal of increasing coping skills.Today's appointment was a telepsychological visit due to COVID-19. Brandi Kirk provided verbal consent for today's telepsychological appointment and she is aware she is responsible for securing confidentiality on her end of the session. Prior to proceeding with today's appointment, Brandi Kirk's physical location at the time of this appointment was obtained as well a phone number she could be reached at in the event of technical difficulties. Brandi Kirk and this provider participated in today's telepsychological service.  ? ?This provider conducted a brief check-in. Brandi Kirk shared, "Things have been going okay." She discussed deviations from her structured meal plan secondary to medications causing cravings. Brandi Kirk noted she started taking her morning Seroquel later in the day the past two days to avoid "brain fog." It was recommended she speak with her prescribing provider to discuss this change; she noted a plan to call her PCP "first thing in the morning." She noted she already discussed Seroquel concerns with Brandi Kirk during her last appointment. She further shared she started taking Qysmia. Additionally Brandi Kirk shared she has been sober for 10 months and her sleep has significantly improved. Moreover, Brandi Kirk recalled one day where she overindulged by frequently snacking to the point of discomfort, adding she had the thought about purging but stopped herself. Further explored and processed. She described  feeling "proud" of herself of the changes to date. She also discussed utilizing the previously developed plan to help with urges/cravings. Positive reinforcement was provided. Nevertheless, due to recent deviations, she described experiencing pressure and stress. As such, psychoeducation provided regarding all or nothing thinking. Moreover, psychoeducation regarding SMART goals was provided and Brandi Kirk was engaged in goal setting. The following goal was established: Brandi Kirk will journal everything she eats at least 5 out of 7 days a week between now and the next appointment with this provider. Possible obstacles/barriers were explored. Furthermore, this provider and Brandi Kirk discussed traditional therapeutic services again. She was receptive as evidenced by her stating, "I could benefit from therapy for even every day things." Brandi Kirk provided verbal consent for this provider to e-mail additional referral options. Overall, Brandi Kirk was receptive to today's appointment as evidenced by openness to sharing, responsiveness to feedback, and  willingness to work toward the established SMART goal . ? ?Mental Status Examination:  ?Appearance: neat ?Behavior: appropriate to circumstances ?Mood: neutral ?Affect: mood congruent ?Speech: WNL ?Eye Contact: appropriate ?Psychomotor Activity: WNL ?Gait: unable to assess ?Thought Process: linear, logical, and goal directed and no evidence or endorsement of suicidal, homicidal, and self-harm ideation, plan and intent  ?Thought Content/Perception: no hallucinations, delusions, bizarre thinking or behavior endorsed or observed ?Orientation: AAOx4 ?Memory/Concentration: memory, attention, language, and fund of knowledge intact  ?Insight: fair ?Judgment: fair ? ?Interventions:  ?Conducted a brief chart review ?Provided empathic reflections and validation ?Reviewed content from the previous session ?Provided positive reinforcement ?Employed supportive psychotherapy interventions to facilitate  reduced distress and to improve coping skills with identified stressors ?Engaged patient in problem solving ?Psychoeducation provided regarding all-or-nothing thinking ?Psychoeducation provided regarding SMART goals ? ?DSM-5 Diagnosis(es): F50.89 Other Specified Feeding or Eating Disorder, Emotional Eating Behaviors  and  F43.20 Adjustment Disorder, Unspecified  ? ?Treatment Goal & Progress: During the initial appointment with this provider, the following treatment goal was established: increase coping skills. Brandi Kirk has demonstrated some progress in her goal as evidenced by her reporting utilization of the previously developed coping plan for urges/cravings.  ? ?Plan: The next appointment will be scheduled in two weeks, which will be via MyChart Video Visit. The next session will focus on working towards the established treatment goal. [name] will establish care with a primary therapist.  ? ?

## 2021-12-25 ENCOUNTER — Ambulatory Visit (INDEPENDENT_AMBULATORY_CARE_PROVIDER_SITE_OTHER): Payer: 59 | Admitting: Family Medicine

## 2021-12-25 ENCOUNTER — Telehealth (INDEPENDENT_AMBULATORY_CARE_PROVIDER_SITE_OTHER): Payer: Self-pay | Admitting: Family Medicine

## 2021-12-25 ENCOUNTER — Encounter (INDEPENDENT_AMBULATORY_CARE_PROVIDER_SITE_OTHER): Payer: Self-pay | Admitting: Family Medicine

## 2021-12-25 VITALS — BP 123/86 | HR 80 | Temp 98.1°F | Ht 63.0 in | Wt 256.0 lb

## 2021-12-25 DIAGNOSIS — K760 Fatty (change of) liver, not elsewhere classified: Secondary | ICD-10-CM | POA: Diagnosis not present

## 2021-12-25 DIAGNOSIS — F39 Unspecified mood [affective] disorder: Secondary | ICD-10-CM

## 2021-12-25 DIAGNOSIS — E669 Obesity, unspecified: Secondary | ICD-10-CM

## 2021-12-25 DIAGNOSIS — Z9189 Other specified personal risk factors, not elsewhere classified: Secondary | ICD-10-CM | POA: Diagnosis not present

## 2021-12-25 DIAGNOSIS — E538 Deficiency of other specified B group vitamins: Secondary | ICD-10-CM

## 2021-12-25 DIAGNOSIS — R638 Other symptoms and signs concerning food and fluid intake: Secondary | ICD-10-CM

## 2021-12-25 DIAGNOSIS — Z6841 Body Mass Index (BMI) 40.0 and over, adult: Secondary | ICD-10-CM

## 2021-12-25 DIAGNOSIS — E559 Vitamin D deficiency, unspecified: Secondary | ICD-10-CM | POA: Diagnosis not present

## 2021-12-25 DIAGNOSIS — E8881 Metabolic syndrome: Secondary | ICD-10-CM | POA: Diagnosis not present

## 2021-12-25 NOTE — Telephone Encounter (Signed)
Brandi Kirk- please send in her script of qsymia to [facility]. Thanks and please call her and notify her it was sent ?

## 2021-12-25 NOTE — Patient Instructions (Addendum)
Obesogenic Medicines ? ?Anti-hypertensive medications most associated with body weight gain include some beta-blockers (propranolol, atenolol, and metoprolol) and calcium channel blockers (mainly through edema = nifedipine and amlodipine).  ? ?Anti-diabetes medications that most promote body weight gain include most insulins, sulfonylureas, thiazolidinediones, and meglitinides.  ? ?Hormone therapies that most promote body weight gain include glucocorticoids and injectable progestins.  ? ?Anti-seizure medications most associated with body weight gain include carbamazepine, gabapentin, valproate, and pregabalin.  ? ?Anti-depressants most associated with body weight gain include some tricyclic antidepressants (amitriptyline, doxepin, imipramine), some selective serotonin reuptake inhibitors (paroxetine), some selective serotonin and norepinephrine reuptake inhibitors (venlafaxine), some irreversible monoamine oxidase inhibitors (isocarboxazid, phenelzine), as well as mirtazapine, brexpiprazole, and trazodone.  ? ?Mood stabilizers most associated with body weight gain include gabapentin, divalproex, lithium, valproate, vigabatrin, cariprazine, carbamazepine.  ? ?Migraine medications most associated with body weight gain include amitriptyline, gabapentin, paroxetine, valproic acid, and some beta blockers. ? ?Among antipsychotics most associated with body weight gain include clozapine, olanzapine, chlorpromazine, brexpiprazole, iloperidone, lithium, quetiapine, risperidone, sertindole, thioridazine, trifluoperazone, and zotepine.  ? ?Chemotherapeutic and anti-inflammatory agents most associated with body weight gain include tamoxifen, cyclophosphamide, methotrexate, 5-fluorouracil, aromatase inhibitors, and corticosteroids.  ? ?Other drugs associated with body weight gain include the hypnotic diphenhydramine, some anti-seizure & antidepressants used for treatment of neuropathy, and some highly active antiretroviral  therapies (HAART) protease inhibitors when not accompanied by lipodystrophy.  ? ?

## 2021-12-26 ENCOUNTER — Encounter (INDEPENDENT_AMBULATORY_CARE_PROVIDER_SITE_OTHER): Payer: Self-pay | Admitting: Family Medicine

## 2021-12-26 MED ORDER — QSYMIA 7.5-46 MG PO CP24: ORAL_CAPSULE | ORAL | 0 refills | Status: DC

## 2021-12-26 NOTE — Telephone Encounter (Signed)
Medication already sent to pharmacy by provider because it is a controlled medication ?

## 2021-12-26 NOTE — Telephone Encounter (Signed)
Dr.Opalski ?

## 2022-01-01 ENCOUNTER — Telehealth: Payer: Self-pay | Admitting: Family Medicine

## 2022-01-01 ENCOUNTER — Encounter: Payer: 59 | Admitting: Family Medicine

## 2022-01-01 NOTE — Telephone Encounter (Signed)
Pt was no show 01/01/2022 ? ?Prior no shows: ?04/05/18 pt forgot ?07/21/18 no call/reason ?10/19/19 car broke down ?2020/01/23 death in family ? ?Charge generated for no show 01/01/22. Please advise if you want me to send out no show letter or dismissal letter. ? ? ?

## 2022-01-01 NOTE — Patient Instructions (Signed)
? ? ?  No follow-ups on file. ? ?

## 2022-01-01 NOTE — Telephone Encounter (Signed)
Decision to dismissal based on no-show protocol please. ?Thanks ?

## 2022-01-01 NOTE — Progress Notes (Signed)
No show

## 2022-01-02 ENCOUNTER — Encounter: Payer: Self-pay | Admitting: Family Medicine

## 2022-01-02 NOTE — Progress Notes (Signed)
? ? ? ?Chief Complaint:  ? ?OBESITY ?Brandi Kirk is here to discuss her progress with her obesity treatment plan along with follow-up of her obesity related diagnoses. Brandi Kirk is on the Category 1 Plan and keeping a food journal and adhering to recommended goals of 1000-1100 calories and 80+ grams of protein and states she is following her eating plan approximately 50% of the time. Modelle states she is doing PT (2 times per week) and cleaning for 45-60 minutes 2-4 times per week. ? ?Today's visit was #: 2 ?Starting weight: 262 lbs ?Starting date: 11/27/2021 ?Today's weight: 256 lbs ?Today's date: 12/25/2021 ?Total lbs lost to date: 6 lbs ?Total lbs lost since last in-office visit: 6 lbs ? ?Interim History: Brandi Kirk is here today for her first follow-up office visit since starting the program with [place].  All blood work/ lab tests that were recently ordered by myself or an outside provider were reviewed with patient today per their request.   Extended time was spent counseling her on all new disease processes that were discovered or preexisting ones that are affected by BMI.  she understands that many of these abnormalities will need to monitored regularly along with the current treatment plan of prudent dietary changes, in which we are making each and every office visit, to improve these health parameters. ? ?Brandi Kirk says that with her psychiatrist increasing her Seroquel, her hunger and cravings are now much worse.  Also recently ran out of Qsymia around 30 days ago, thus double the challenges, but still did great. ? ?Subjective:  ? ?1. Abnormal cravings-  medication induced ?Discussed labs with patient today.  ?For 2 days prior to her last appointment, she ran out of Qsymia and has had none since.  Treated by [name], FNP, Prior.  Her med script is sent to an online company to fill - [facility]. ? ?2. Insulin resistance syndrome ?New.  Discussed labs with patient today.  ?IR 9.2, A1c is within normal limits at  5.3 now, but she endorses history of being diagnosed with prediabetes.  She endorses cravings. ? ?3. Vitamin D deficiency ?Discussed labs with patient today.  ?OTC compliance has been great.  She takes OTC 1000 IU once daily. ? ?4. Fatty liver ?Discussed labs with patient today.  ?Stable.  No ETOH for 1 year almost. ? ?5. B12 deficiency ?Discussed labs with patient today.  ?She takes OTC vitamin B12 1000 mcg daily.  Endorses being tired all the time.  No neuropathy symptoms currently.  Denies metformin use. ? 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  ?PCP, Dr. Raoul Pitch, increased her Seroquel 2 weeks ago.  She has increased cravings and increased hunger now.  Cannot seem to tame it, per patient.  No history of bipolar disorder.  No choice on medication.   ? ?7. At risk for heart disease ?Brandi Kirk is at higher than average risk for cardiovascular disease due to obesity.  ? ?Assessment/Plan:  ?No orders of the defined types were placed in this encounter. ? ? ?Medications Discontinued During This Encounter  ?Medication Reason  ? Phentermine-Topiramate (QSYMIA) 7.5-46 MG CP24   ?  ? ?Meds ordered this encounter  ?Medications  ? Phentermine-Topiramate (QSYMIA) 7.5-46 MG CP24  ?  Sig: 1 po qd  ?  Dispense:  30 capsule  ?  Refill:  0  ?  ? ?1. Abnormal cravings-  medication induced ?Medication induced by Psychiatric medication.  Refill Qysmia, but advised patient it would be best  to be on a GLP-1 due to drug tolerance.  PDMP checked and controlled substance paperwork signed. ? ?- Start Phentermine-Topiramate (QSYMIA) 7.5-46 MG CP24; 1 po qd  Dispense: 30 capsule; Refill: 0 ? ?2. Insulin resistance syndrome ?Patient will call insurance and see about GLP-1 and weight loss medication coverage.  Brandi Kirk tried metformin in the past either.  Lower carbs, lose weight. ? ?3. Vitamin D deficiency ?Not at goal of 50+.  Increase OTC to 4000 IU daily.  Rhecheck in 3 months.  Vitamin D counseling  done. ? ?4. Fatty liver ?ALT essentially normal at 33, CMP otherwise within normal limits.  Treament goal is 7-10% reduction in body weight, decrease simple carbs and eventually increase exercise.  Consider GLP-1 or metformin to help. ? ?5. B12 deficiency ?B12 is at goal.  Continue current supplement.  .The diagnosis was reviewed with the patient. Counseling provided today, see below. We will continue to monitor. Orders and follow up as documented in patient record. ? ?Counseling ?The body needs vitamin B12: to make red blood cells; to make DNA; and to help the nerves work properly so they can carry messages from the brain to the body.  ?The main causes of vitamin B12 deficiency include dietary deficiency, digestive diseases, pernicious anemia, and having a surgery in which part of the stomach or small intestine is removed.  ?Certain medicines can make it harder for the body to absorb vitamin B12. These medicines include: heartburn medications; some antibiotics; some medications used to treat diabetes, gout, and high cholesterol.  ?In some cases, there are no symptoms of this condition. If the condition leads to anemia or nerve damage, various symptoms can occur, such as weakness or fatigue, shortness of breath, and numbness or tingling in your hands and feet.   ?Treatment:  ?May include taking vitamin B12 supplements.  ?Avoid alcohol.  ?Eat lots of healthy foods that contain vitamin B12: ?Beef, pork, chicken, [place], and organ meats, such as liver.  ?Seafood: This includes clams, rainbow trout, salmon, tuna, and haddock. Eggs.  ?Cereal and dairy products that are fortified: This means that vitamin B12 has been added to the food.  ? ? 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  Long discussion with patient regarding obesogenic medications.  Advised it is best to not use one medication to treat the side effects of another. ? ?7. At risk for heart disease ?Due to Chauntel Windsor  current state of health and medical condition(s), she is at a higher risk for heart disease.  This puts the patient at much greater risk to subsequently develop cardiopulmonary conditions that can significantly affect patient's quality of life in a negative manner as well.   ? ?At least 32 minutes were spent on counseling Lemmie Steinhaus about these concerns today, and we discussed the importance of reversing risks factors of obesity, especially truncal and visceral fat, hypertension, hyperlipidemia, and pre-diabetes.  The initial goal is to lose at least 5-10% of starting weight to help reduce these risk factors.  Counseling: Intensive lifestyle modifications were discussed with Evlyn Courier as the most appropriate first line of treatment.  she will continue to work on diet, exercise, and weight loss efforts.  We will continue to reassess these conditions on a fairly regular basis in an attempt to decrease the patient's overall morbidity and mortality. ? ?Evidence-based interventions for health behavior change were utilized today including the discussion of self monitoring techniques, problem-solving barriers, and SMART goal setting techniques.  Specifically,  regarding patient's less desirable eating habits and patterns, we employed the technique of small changes when Maleeya Peterkin has not been able to fully commit to her prudent nutritional plan. ?  ?8. Obesity with current BMI of 45.5 ? ?Resa is currently in the action stage of change. As such, her goal is to continue with weight loss efforts. She has agreed to the Category 1 Plan and keeping a food journal and adhering to recommended goals of 1000-1100 calories and 80+ grams of protein.  ? ?She will call her insurance about GLP-1 coverage for her IR. ? ?Exercise goals:  As is. ? ?Behavioral modification strategies: increasing lean protein intake, decreasing simple carbohydrates, and planning for success. ? ?Cherity has agreed to follow-up with our clinic in 2-3 weeks  with [name], FNP. She was informed of the importance of frequent follow-up visits to maximize her success with intensive lifestyle modifications for her multiple health conditions.  ? ?Objective:  ?

## 2022-01-02 NOTE — Telephone Encounter (Signed)
processed dismissal 4/6 KO ?

## 2022-01-07 ENCOUNTER — Telehealth (INDEPENDENT_AMBULATORY_CARE_PROVIDER_SITE_OTHER): Payer: 59 | Admitting: Psychology

## 2022-01-07 DIAGNOSIS — F4320 Adjustment disorder, unspecified: Secondary | ICD-10-CM | POA: Diagnosis not present

## 2022-01-07 DIAGNOSIS — F5089 Other specified eating disorder: Secondary | ICD-10-CM

## 2022-01-07 DIAGNOSIS — R69 Illness, unspecified: Secondary | ICD-10-CM | POA: Diagnosis not present

## 2022-01-07 NOTE — Progress Notes (Signed)
?  Office: [phone]  /  Fax: [phone] ? ? ? ?Date: 01/21/2022   ?Appointment Start Time: 11:02am ?Duration: 24 minutes ?Provider: Glennie Isle, Psy.D. ?Type of Session: Individual Therapy  ?Location of Patient: Home (private location) ?Location of Provider: Provider's Home (private office) ?Type of Contact: Telepsychological Visit via MyChart Video Visit ? ?Session Content: Brandi Kirk is a 36 y.o. female presenting for a follow-up appointment to address the previously established treatment goal of increasing coping skills.Today's appointment was a telepsychological visit due to COVID-19. [name] provided verbal consent for today's telepsychological appointment and she is aware she is responsible for securing confidentiality on her end of the session. Prior to proceeding with today's appointment, [name]'s physical location at the time of this appointment was obtained as well a phone number she could be reached at in the event of technical difficulties. [name] and this provider participated in today's telepsychological service.  ? ?This provider conducted a brief check-in. [name] stated, "It's been rough." Further explored and processed. She indicated she followed-up with her PCP, adding the practice reportedly "dismissed" her due to missed appointments. She continues to report challenges with sleep and brain "fog." Despite the aforementioned, she continues to lose weight; however, acknowledged engagement in emotional eating behaviors. Further explored and processed. She indicated she did not journal since the last appointment with this provider and did not eat regularly. Thus, a new SMART goal was established. The following goal was established: Hether will eat breakfast and journal what she eats at least 3 out of 7 days a week between now and the next appointment with this provider. She was observed writing her goal down. [name] noted a plan to journal after she checks her son's grades/school records in the  morning as she does that routinely. Furthermore, [name] reported she did not see the referrals that were sent; therefore, they were re-sent via e-mail. Notably, she continues to abstain from alcohol use. Overall, [name] was receptive to today's appointment as evidenced by openness to sharing, responsiveness to feedback, and willingness to implement discussed strategies . ? ?Mental Status Examination:  ?Appearance: neat ?Behavior: appropriate to circumstances ?Mood: sad ?Affect: mood congruent ?Speech: WNL ?Eye Contact: appropriate ?Psychomotor Activity: WNL ?Gait: unable to assess ?Thought Process: linear, logical, and goal directed and denies suicidal, homicidal, and self-harm ideation, plan and intent  ?Thought Content/Perception: no hallucinations, delusions, bizarre thinking or behavior endorsed or observed ?Orientation: AAOx4 ?Memory/Concentration: memory, attention, language, and fund of knowledge intact  ?Insight: fair ?Judgment: fair ? ?Interventions:  ?Conducted a brief chart review ?Provided empathic reflections and validation ?Reviewed content from the previous session ?Employed supportive psychotherapy interventions to facilitate reduced distress and to improve coping skills with identified stressors ?Engaged patient in goal setting ?Recommended/discussed option for longer-term therapeutic and psychiatric services ? ?DSM-5 Diagnosis(es):  F50.89 Other Specified Feeding or Eating Disorder, Emotional Eating Behaviors and  F43.20 Adjustment Disorder, Unspecified  ? ?Treatment Goal & Progress: During the initial appointment with this provider, the following treatment goal was established: increase coping skills. [name] has demonstrated progress in her goal as evidenced by her continued to demonstrate willingness to engage in learned skill(s). ? ?Plan: The next appointment is scheduled for Feb 11, 2022 at 2:30pm, which will be via [facility] [facility] Visit. The next session will focus on working towards the  established treatment goal. Additionally, [name] agreed to contact [facility] after this appointment to establish care for psychiatric and therapeutic services.  ? ?

## 2022-01-08 ENCOUNTER — Ambulatory Visit (INDEPENDENT_AMBULATORY_CARE_PROVIDER_SITE_OTHER): Payer: 59 | Admitting: Family Medicine

## 2022-01-08 ENCOUNTER — Ambulatory Visit (INDEPENDENT_AMBULATORY_CARE_PROVIDER_SITE_OTHER): Payer: 59 | Admitting: Nurse Practitioner

## 2022-01-16 ENCOUNTER — Ambulatory Visit (INDEPENDENT_AMBULATORY_CARE_PROVIDER_SITE_OTHER): Payer: 59 | Admitting: Family Medicine

## 2022-01-16 ENCOUNTER — Encounter (INDEPENDENT_AMBULATORY_CARE_PROVIDER_SITE_OTHER): Payer: Self-pay | Admitting: Family Medicine

## 2022-01-16 VITALS — BP 115/68 | HR 80 | Temp 98.3°F | Ht 63.0 in | Wt 255.0 lb

## 2022-01-16 DIAGNOSIS — Z6841 Body Mass Index (BMI) 40.0 and over, adult: Secondary | ICD-10-CM | POA: Diagnosis not present

## 2022-01-16 DIAGNOSIS — E669 Obesity, unspecified: Secondary | ICD-10-CM

## 2022-01-16 DIAGNOSIS — R638 Other symptoms and signs concerning food and fluid intake: Secondary | ICD-10-CM

## 2022-01-16 DIAGNOSIS — Z9189 Other specified personal risk factors, not elsewhere classified: Secondary | ICD-10-CM

## 2022-01-16 DIAGNOSIS — R69 Illness, unspecified: Secondary | ICD-10-CM | POA: Diagnosis not present

## 2022-01-16 DIAGNOSIS — F5089 Other specified eating disorder: Secondary | ICD-10-CM

## 2022-01-16 MED ORDER — QSYMIA 7.5-46 MG PO CP24: ORAL_CAPSULE | ORAL | 0 refills | Status: DC

## 2022-01-21 ENCOUNTER — Telehealth (INDEPENDENT_AMBULATORY_CARE_PROVIDER_SITE_OTHER): Payer: 59 | Admitting: Psychology

## 2022-01-21 DIAGNOSIS — F4320 Adjustment disorder, unspecified: Secondary | ICD-10-CM

## 2022-01-21 DIAGNOSIS — R69 Illness, unspecified: Secondary | ICD-10-CM | POA: Diagnosis not present

## 2022-01-21 DIAGNOSIS — F5089 Other specified eating disorder: Secondary | ICD-10-CM

## 2022-01-26 ENCOUNTER — Other Ambulatory Visit: Payer: Self-pay | Admitting: Family Medicine

## 2022-01-26 DIAGNOSIS — I10 Essential (primary) hypertension: Secondary | ICD-10-CM

## 2022-01-26 DIAGNOSIS — R000 Tachycardia, unspecified: Secondary | ICD-10-CM

## 2022-01-28 NOTE — Progress Notes (Signed)
?  Office: [phone]  /  Fax: [phone] ? ? ? ?Date: 02/11/2022   ?Appointment Start Time: 2:30pm ?Duration: 30 minutes ?Provider: Glennie Isle, Psy.D. ?Type of Session: Individual Therapy  ?Location of Patient: Home (private location) ?Location of Provider: Provider's Home (private office) ?Type of Contact: Telepsychological Visit via MyChart Video Visit ? ?Session Content: Brandi Kirk is a 36 y.o. female presenting for a follow-up appointment to address the previously established treatment goal of increasing coping skills.Today's appointment was a telepsychological visit due to COVID-19. Brandi Kirk provided verbal consent for today's telepsychological appointment and she is aware she is responsible for securing confidentiality on her end of the session. Prior to proceeding with today's appointment, Brandi Kirk's physical location at the time of this appointment was obtained as well a phone number she could be reached at in the event of technical difficulties. Barry and this provider participated in today's telepsychological service.  ? ?Per [employer] new policy, Brandi Kirk will be e-mailed two additional forms (AOB and Receipt of NPP) to sign as well as [facility]'s Notice of Privacy Practices. The content of the documents were explained to Brandi Kirk at the onset of the appointment, and she agreed to proceed.  ? ?This provider conducted a brief check-in. Brandi Kirk shared about recent events, including ongoing stressors. She discussed engaging in boredom eating or "excessive cleaning." Further explored and processed. Psychoeducation regarding emotional versus physical hunger was provided. Brandi Kirk was given a handout to utilize between now and the next appointment to increase awareness of hunger patterns and subsequent eating. Brandi Kirk provided verbal consent during today's appointment for this provider to send a handout about emotional and physical hunger via e-mail. Remainder of today's appointment focused on reflecting on progress to  date. Overall, Brandi Kirk was receptive to today's appointment as evidenced by openness to sharing, responsiveness to feedback, and  willingness to reflect on emotional versus physical hunger . ? ?Mental Status Examination:  ?Appearance: neat ?Behavior: appropriate to circumstances ?Mood: neutral ?Affect: mood congruent ?Speech: WNL ?Eye Contact: appropriate ?Psychomotor Activity: WNL ?Gait: unable to assess ?Thought Process: linear, logical, and goal directed and denies suicidal, homicidal, and self-harm ideation, plan and intent  ?Thought Content/Perception: no hallucinations, delusions, bizarre thinking or behavior endorsed or observed ?Orientation: AAOx4 ?Memory/Concentration: memory, attention, language, and fund of knowledge intact  ?Insight: fair ?Judgment: fair ? ?Interventions:  ?Conducted a brief chart review ?Conducted a risk assessment ?Provided empathic reflections and validation ?Employed supportive psychotherapy interventions to facilitate reduced distress and to improve coping skills with identified stressors ?Psychoeducation provided regarding emotional and physical hunger characteristics  ? ?DSM-5 Diagnosis(es):  F50.89 Other Specified Feeding or Eating Disorder, Emotional Eating Behaviors and  F43.20 Adjustment Disorder, Unspecified  ? ?Treatment Goal & Progress: During the initial appointment with this provider, the following treatment goal was established: increase coping skills. Brandi Kirk has demonstrated progress in her goal as evidenced by willingness to engage in learned skill(s). ? ?Plan: The next appointment is scheduled for 03/11/2022 at 2pm, which will be via [facility] Visit. The next session will focus on working towards the established treatment goal. Additionally, Brandi Kirk noted she will sign the forms at the clinic tomorrow when she goes for her appointment with [name], NP at 11:45am. Moreover, Brandi Kirk stated she has an appointment with [name] for  psychiatric services on Feb 19, 2022. She plans to discuss initiating therapeutic services at that appointment. This provider will re-send previously shared referrals for therapeutic services.  ?

## 2022-02-03 ENCOUNTER — Other Ambulatory Visit: Payer: Self-pay | Admitting: Family Medicine

## 2022-02-03 NOTE — Progress Notes (Signed)
? ? ? ?Chief Complaint:  ? ?OBESITY ?Brandi Kirk is here to discuss her progress with her obesity treatment plan along with follow-up of her obesity related diagnoses. Brandi Kirk is on the Category 1 Plan and keeping a food journal and adhering to recommended goals of 1000-1100 calories and 80+ grams of protein daily and states she is following her eating plan approximately 30% of the time. Brandi Kirk states she is not currently exercising. ? ?Today's visit was #: 3 ?Starting weight: 262 lbs ?Starting date: 11/27/2021 ?Today's weight: 255 lbs ?Today's date: 01/16/2022 ?Total lbs lost to date: 7 ?Total lbs lost since last in-office visit: 1 ? ?Interim History: Brandi Kirk is not able to follow meal plan. Emotionally struggling right now and mind racing.  ? ?Subjective:  ? ?1. Abnormal cravings-  medication induced ?Brandi Kirk feels Qsymia is not as effective now as prior months. Been on several months and we discussed tolerance development with phentermine.  ? ?2. Other Specified Feeding or Eating Disorder, Emotional Eating Behaviors ?Treated by her PCP, Dr. Raoul Kirk with Seroquel. PCP wants to get mood stable prior to making any changes. She knows this medication has caused weight gain and increased appetite in patient.  ? ?3. At risk for adverse drug reaction ?Brandi Kirk is at risk for for drug side effects due to Seroquel.  ? ?Assessment/Plan:  ?No orders of the defined types were placed in this encounter. ? ? ?Medications Discontinued During This Encounter  ?Medication Reason  ? Phentermine-Topiramate (QSYMIA) 7.5-46 MG CP24 Reorder  ?  ? ?Meds ordered this encounter  ?Medications  ? Phentermine-Topiramate (QSYMIA) 7.5-46 MG CP24  ?  Sig: 1 po qd  ?  Dispense:  30 capsule  ?  Refill:  0  ?  ? ?1. Abnormal cravings-  medication induced ?We will refill Qsymia, patient is aware of limitations. Will consider increasing dose at next office visit if mood is stabilized some.  ? ?- Phentermine-Topiramate (QSYMIA) 7.5-46 MG CP24; 1 po qd  Dispense: 30  capsule; Refill: 0 ? ?2. Other Specified Feeding or Eating Disorder, Emotional Eating Behaviors ?Patient is awaiting a new counselor besides Dr. Mallie Kirk. I also recommended the patient obtain a Psychiatrist for medication management of her mood. ? ?3. At risk for adverse drug reaction ?Brandi Kirk was given approximately 15 minutes of drug side effect counseling today.  We discussed side effect possibility and risk versus benefits. [name] agreed to the medication and will contact this office if these side effects are intolerable. ? ?Repetitive spaced learning was employed today to elicit superior memory formation and behavioral change. ? ?4. Obesity with current BMI of 45.3 ?Brandi Kirk is currently in the action stage of change. As such, her goal is to continue with weight loss efforts. She has agreed to keeping a food journal and adhering to recommended goals of 1000-1100 calories and 80+ grams of protein daily.  ? ?Exercise goals: As is. ? ?Behavioral modification strategies: increasing lean protein intake, decreasing simple carbohydrates, and planning for success. ? ?Brandi Kirk has agreed to follow-up with our clinic in 3 to 4 weeks. She was informed of the importance of frequent follow-up visits to maximize her success with intensive lifestyle modifications for her multiple health conditions.  ? ?Objective:  ? ?Blood pressure 115/68, pulse 80, temperature 98.3 ?F (36.8 ?C), height 5' 3"  (1.6 m), weight 255 lb (115.7 kg), SpO2 99 %. ?Body mass index is 45.17 kg/m?. ? ?General: Cooperative, alert, well developed, in no acute distress. ?HEENT: Conjunctivae and lids unremarkable. ?Cardiovascular: Regular rhythm.  ?  Lungs: Normal work of breathing. ?Neurologic: No focal deficits.  ? ?Lab Results  ?Component Value Date  ? CREATININE 0.80 11/27/2021  ? BUN 8 11/27/2021  ? NA 137 11/27/2021  ? K 4.5 11/27/2021  ? CL 102 11/27/2021  ? CO2 20 11/27/2021  ? ?Lab Results  ?Component Value Date  ? ALT 33 (H) 11/27/2021  ? AST 24 11/27/2021   ? ALKPHOS 125 (H) 11/27/2021  ? BILITOT 0.9 11/27/2021  ? ?Lab Results  ?Component Value Date  ? HGBA1C 5.3 11/27/2021  ? HGBA1C 5.3 04/20/2018  ? HGBA1C 5.2 11/25/2016  ? ?Lab Results  ?Component Value Date  ? INSULIN 9.2 11/27/2021  ? ?Lab Results  ?Component Value Date  ? TSH 1.030 11/27/2021  ? ?Lab Results  ?Component Value Date  ? CHOL 161 11/27/2021  ? HDL 36 (L) 11/27/2021  ? LDLCALC 109 (H) 11/27/2021  ? TRIG 86 11/27/2021  ? CHOLHDL 5 05/02/2020  ? ?Lab Results  ?Component Value Date  ? VD25OH 38.2 11/27/2021  ? VD25OH 36.55 07/17/2021  ? VD25OH 42.46 05/02/2020  ? ?Lab Results  ?Component Value Date  ? WBC 6.9 11/27/2021  ? HGB 13.9 11/27/2021  ? HCT 40.6 11/27/2021  ? MCV 89 11/27/2021  ? PLT 287 11/27/2021  ? ?Lab Results  ?Component Value Date  ? IRON 70 07/17/2021  ? TIBC 303 07/17/2021  ? FERRITIN 124 07/17/2021  ? ?Attestation Statements:  ? ?Reviewed by clinician on day of visit: allergies, medications, problem list, medical history, surgical history, family history, social history, and previous encounter notes. ? ? ?I, Trixie Dredge, am acting as transcriptionist for [employer], DO. ? ?I have reviewed the above documentation for accuracy and completeness, and I agree with the above. Marjory Sneddon, D.O. ? ?The [place] was signed into law in 2016 which includes the topic of electronic health records.  This provides immediate access to information in MyChart.  This includes consultation notes, operative notes, office notes, lab results and pathology reports.  If you have any questions about what you read please let us know at your next visit so we can discuss your concerns and take corrective action if need be.  We are right here with you. ? ? ?

## 2022-02-11 ENCOUNTER — Telehealth (INDEPENDENT_AMBULATORY_CARE_PROVIDER_SITE_OTHER): Payer: 59 | Admitting: Psychology

## 2022-02-11 DIAGNOSIS — F5089 Other specified eating disorder: Secondary | ICD-10-CM

## 2022-02-11 DIAGNOSIS — F4320 Adjustment disorder, unspecified: Secondary | ICD-10-CM | POA: Diagnosis not present

## 2022-02-11 DIAGNOSIS — R69 Illness, unspecified: Secondary | ICD-10-CM | POA: Diagnosis not present

## 2022-02-12 ENCOUNTER — Ambulatory Visit (INDEPENDENT_AMBULATORY_CARE_PROVIDER_SITE_OTHER): Payer: 59 | Admitting: Nurse Practitioner

## 2022-02-12 ENCOUNTER — Encounter (INDEPENDENT_AMBULATORY_CARE_PROVIDER_SITE_OTHER): Payer: Self-pay | Admitting: Nurse Practitioner

## 2022-02-12 VITALS — BP 120/76 | HR 80 | Temp 98.1°F | Ht 63.0 in | Wt 253.0 lb

## 2022-02-12 DIAGNOSIS — E669 Obesity, unspecified: Secondary | ICD-10-CM | POA: Diagnosis not present

## 2022-02-12 DIAGNOSIS — K760 Fatty (change of) liver, not elsewhere classified: Secondary | ICD-10-CM | POA: Diagnosis not present

## 2022-02-12 DIAGNOSIS — R638 Other symptoms and signs concerning food and fluid intake: Secondary | ICD-10-CM | POA: Diagnosis not present

## 2022-02-12 DIAGNOSIS — Z6841 Body Mass Index (BMI) 40.0 and over, adult: Secondary | ICD-10-CM

## 2022-02-17 NOTE — Progress Notes (Signed)
Chief Complaint:   OBESITY Brandi Kirk is here to discuss her progress with her obesity treatment plan along with follow-up of her obesity related diagnoses. Brandi Kirk is on keeping a food journal and adhering to recommended goals of 1100 calories and 80-90 grams of protein and states she is following her eating plan approximately 50% of the time. Brandi Kirk states she is doing 0 minutes 0 times per week.  Today's visit was #: 4 Starting weight: 262 lbs Starting date: 11/27/2021 Today's weight: 253 lbs Today's date: 02/12/2022 Total lbs lost to date: 9 lbs Total lbs lost since last in-office visit: 2 lbs  Interim History: Brandi Kirk has done well with weight loss since her last visit. She has been under a lot of stress since her last visit. She notes some stress eating but has been trying to watch her portion sizes when she is stress eating. She struggles with emotional eating and is seeing Dr. Mallie Mussel, our bariatric psychiatrist on a regular basis. She is seeing a new psychiatrist next week. She has a full gym and pool at her house.  She is taking Qsymia 7.5-46 mg. Denies side effects.  Is requesting to increase her dose due to hunger and cravings.    Subjective:   1. Fatty liver Brandi Kirk stopped alcohol intake June 6th, 2022. Her last CMP was 11/27/2021. Her ALT was 33. AST was 24. She denies abdominal pain.   2. Abnormal cravings-  medication induced Brandi Kirk is taking Qsymia 7.5-46 mg. She denies side effects. She is struggling with cravings and hunger   Assessment/Plan:   1. Fatty liver Brandi Kirk will continue working on dietary changes, exercises, and weight loss. We discussed the likely diagnosis of non-alcoholic fatty liver disease today and how this condition is obesity related. Brandi Kirk was educated the importance of weight loss. Brandi Kirk agreed to continue with her weight loss efforts with healthier diet and exercise as an essential part of her treatment plan.   2. Abnormal cravings-  medication  induced Brandi Kirk agrees to increase Qsymia from 7.5-46 mg to  11.25-69 mg. We discussed side effects.   3. Obesity with current BMI of 44.9 Brandi Kirk is currently in the action stage of change. As such, her goal is to continue with weight loss efforts. She has agreed to keeping a food journal and adhering to recommended goals of 1100 calories and 80-90 grams of protein.   Exercise goals:  Brandi Kirk will start swimming.   Behavioral modification strategies: increasing lean protein intake and no skipping meals.  Brandi Kirk has agreed to follow-up with our clinic in 3 weeks. She was informed of the importance of frequent follow-up visits to maximize her success with intensive lifestyle modifications for her multiple health conditions.   Objective:   Blood pressure 120/76, pulse 80, temperature 98.1 F (36.7 C), height 5' 3"  (1.6 m), weight 253 lb (114.8 kg), SpO2 99 %. Body mass index is 44.82 kg/m.  General: Cooperative, alert, well developed, in no acute distress. HEENT: Conjunctivae and lids unremarkable. Cardiovascular: Regular rhythm.  Lungs: Normal work of breathing. Neurologic: No focal deficits.   Lab Results  Component Value Date   CREATININE 0.80 11/27/2021   BUN 8 11/27/2021   NA 137 11/27/2021   K 4.5 11/27/2021   CL 102 11/27/2021   CO2 20 11/27/2021   Lab Results  Component Value Date   ALT 33 (H) 11/27/2021   AST 24 11/27/2021   ALKPHOS 125 (H) 11/27/2021   BILITOT 0.9 11/27/2021   Lab Results  Component Value Date   HGBA1C 5.3 11/27/2021   HGBA1C 5.3 04/20/2018   HGBA1C 5.2 11/25/2016   Lab Results  Component Value Date   INSULIN 9.2 11/27/2021   Lab Results  Component Value Date   TSH 1.030 11/27/2021   Lab Results  Component Value Date   CHOL 161 11/27/2021   HDL 36 (L) 11/27/2021   LDLCALC 109 (H) 11/27/2021   TRIG 86 11/27/2021   CHOLHDL 5 05/02/2020   Lab Results  Component Value Date   VD25OH 38.2 11/27/2021   VD25OH 36.55 07/17/2021   VD25OH  42.46 05/02/2020   Lab Results  Component Value Date   WBC 6.9 11/27/2021   HGB 13.9 11/27/2021   HCT 40.6 11/27/2021   MCV 89 11/27/2021   PLT 287 11/27/2021   Lab Results  Component Value Date   IRON 70 07/17/2021   TIBC 303 07/17/2021   FERRITIN 124 07/17/2021   Attestation Statements:   Reviewed by clinician on day of visit: allergies, medications, problem list, medical history, surgical history, family history, social history, and previous encounter notes.  I, Lizbeth Bark, RMA, am acting as Location manager for [name], FNP.  I have reviewed the above documentation for accuracy and completeness, and I agree with the above. [name], FNP

## 2022-02-18 DIAGNOSIS — N898 Other specified noninflammatory disorders of vagina: Secondary | ICD-10-CM | POA: Diagnosis not present

## 2022-02-18 DIAGNOSIS — B009 Herpesviral infection, unspecified: Secondary | ICD-10-CM | POA: Insufficient documentation

## 2022-02-18 DIAGNOSIS — N760 Acute vaginitis: Secondary | ICD-10-CM | POA: Diagnosis not present

## 2022-02-19 DIAGNOSIS — R69 Illness, unspecified: Secondary | ICD-10-CM | POA: Diagnosis not present

## 2022-02-19 DIAGNOSIS — F411 Generalized anxiety disorder: Secondary | ICD-10-CM | POA: Diagnosis not present

## 2022-02-20 ENCOUNTER — Other Ambulatory Visit: Payer: Self-pay | Admitting: Family Medicine

## 2022-02-20 MED ORDER — QSYMIA 11.25-69 MG PO CP24: 1.0000 | ORAL_CAPSULE | Freq: Every day | ORAL | 0 refills | Status: DC

## 2022-02-25 NOTE — Progress Notes (Signed)
Office: [phone]  /  Fax: [phone]    Date: 03/11/2022   Appointment Start Time: 2:01pm Duration: 28 minutes Provider: Glennie Isle, Psy.D. Type of Session: Individual Therapy  Location of Patient: Home (private location) Location of Provider: Provider's Home (private office) Type of Contact: Telepsychological Visit via MyChart Video Visit  Session Content: Brandi Kirk is a 36 y.o. female presenting for a follow-up appointment to address the previously established treatment goal of increasing coping skills.Today's appointment was a telepsychological visit due to COVID-19. [name] provided verbal consent for today's telepsychological appointment and she is aware she is responsible for securing confidentiality on her end of the session. Prior to proceeding with today's appointment, [name]'s physical location at the time of this appointment was obtained as well a phone number she could be reached at in the event of technical difficulties. [name] and this provider participated in today's telepsychological service.   This provider conducted a brief check-in. [name] shared, "I've been beyond stressed." She discussed experiencing "severe anxiety" and panic attacks regarding her little brother living with her. [name] stated she met with her psychiatric provider, noting Seroquel was increased and she was prescribed Klonopin PRN. Additionally, [name] reported challenges with eating as she is "so upset." Despite ongoing stressors, she denied engagement in binge eating behaviors; however, she acknowledged occassionally engaging in emotional eating behaviors. Notably, [name] continues to deny cravings for alcohol. Reviewed emotional and physical hunger. Psychoeducation regarding triggers for emotional eating was provided. [name] was provided a handout, and encouraged to utilize the handout between now and the next appointment to increase awareness of triggers and frequency. [name] agreed. This provider  discussed stress management as [name] acknowledged stress is her biggest trigger at this time. She discussed a plan to start painting today as it helps reduce stress and the likelihood of being triggered to engage in emotional eating behaviors. [name] provided verbal consent during today's appointment for this provider to send a handout about triggers via e-mail. Overall, [name] was receptive to today's appointment as evidenced by openness to sharing, responsiveness to feedback, and willingness to explore triggers for emotional eating.  Mental Status Examination:  Appearance: neat Behavior: appropriate to circumstances Mood: anxious Affect: mood congruent Speech: WNL Eye Contact: intermittent Psychomotor Activity: WNL Gait: unable to assess Thought Process: linear, logical, and goal directed and denies suicidal, homicidal, and self-harm ideation, plan and intent  Thought Content/Perception: no hallucinations, delusions, bizarre thinking or behavior endorsed or observed Orientation: AAOx4 Memory/Concentration: memory, attention, language, and fund of knowledge intact  Insight: fair Judgment: fair  Interventions:  Conducted a brief chart review Conducted a risk assessment Provided empathic reflections and validation Reviewed content from the previous session Employed supportive psychotherapy interventions to facilitate reduced distress and to improve coping skills with identified stressors Psychoeducation provided regarding triggers for emotional eating behaviors  DSM-5 Diagnosis(es):  F50.89 Other Specified Feeding or Eating Disorder, Emotional Eating Behaviors and  F43.20 Adjustment Disorder, Unspecified   Treatment Goal & Progress: During the initial appointment with this provider, the following treatment goal was established: increase coping skills. Calvary has demonstrated progress in her goal as evidenced by increased awareness of hunger patterns. [name] also continues to demonstrate  willingness to engage in learned skill(s).  Plan: The next appointment is scheduled for 03/31/2022 at 4pm, which will be via MyChart Video Visit. The next session will focus on working towards the established treatment goal. [name] will meet with her psychiatric provider on April 02, 2022. An additional referral option Consulting civil engineer) was provided and [name]  agreed to call following this appointment.

## 2022-02-28 ENCOUNTER — Ambulatory Visit (INDEPENDENT_AMBULATORY_CARE_PROVIDER_SITE_OTHER): Payer: 59 | Admitting: Nurse Practitioner

## 2022-02-28 ENCOUNTER — Encounter (HOSPITAL_BASED_OUTPATIENT_CLINIC_OR_DEPARTMENT_OTHER): Payer: Self-pay | Admitting: Nurse Practitioner

## 2022-02-28 VITALS — BP 134/81 | HR 95 | Temp 97.8°F | Ht 63.0 in | Wt 254.8 lb

## 2022-02-28 DIAGNOSIS — E662 Morbid (severe) obesity with alveolar hypoventilation: Secondary | ICD-10-CM

## 2022-02-28 DIAGNOSIS — Z6841 Body Mass Index (BMI) 40.0 and over, adult: Secondary | ICD-10-CM

## 2022-02-28 DIAGNOSIS — G8929 Other chronic pain: Secondary | ICD-10-CM

## 2022-02-28 DIAGNOSIS — R69 Illness, unspecified: Secondary | ICD-10-CM | POA: Diagnosis not present

## 2022-02-28 DIAGNOSIS — F319 Bipolar disorder, unspecified: Secondary | ICD-10-CM

## 2022-02-28 DIAGNOSIS — R7303 Prediabetes: Secondary | ICD-10-CM

## 2022-02-28 DIAGNOSIS — I10 Essential (primary) hypertension: Secondary | ICD-10-CM | POA: Diagnosis not present

## 2022-02-28 DIAGNOSIS — M545 Low back pain, unspecified: Secondary | ICD-10-CM

## 2022-02-28 DIAGNOSIS — K760 Fatty (change of) liver, not elsewhere classified: Secondary | ICD-10-CM | POA: Diagnosis not present

## 2022-02-28 DIAGNOSIS — F418 Other specified anxiety disorders: Secondary | ICD-10-CM | POA: Diagnosis not present

## 2022-02-28 DIAGNOSIS — F1021 Alcohol dependence, in remission: Secondary | ICD-10-CM
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
## 2022-02-28 NOTE — Assessment & Plan Note (Signed)
Chronic.  Patient has been very successful in weight loss efforts since January of this year.  She is monitoring her diet and exercising on a daily basis.  She is working closely with healthy weight and wellness for medication and weight loss management.  Review of labs today show normalization of her hemoglobin A1c and significant improvement of liver function.  Recommend continuation with current efforts.  We will continue to collaborate care with healthy weight and wellness.

## 2022-02-28 NOTE — Patient Instructions (Addendum)
Thank you for choosing [facility] for your Primary Care needs. I am excited for the opportunity to partner with you to meet your health care goals. It was a pleasure meeting you today!  Recommendations from today's visit: I have sent the referral to PT - you should hear from them in the next week or so to begin scheduling your appointment.  I will review your labs and your history I am so proud of you and your success  Information on diet, exercise, and health maintenance recommendations are listed below. This is information to help you be sure you are on track for optimal health and monitoring.   Please look over this and let us know if you have any questions or if you have completed any of the health maintenance outside of [place] so that we can be sure your records are up to date.  ___________________________________________________________ About Me: I am an Adult-Geriatric Nurse Practitioner with a background in caring for patients for more than 20 years with a strong intensive care background. I provide primary care and sports medicine services to patients age 55 and older within this office. My education had a strong focus on caring for the older adult population, which I am passionate about. I am also the director of the APP Fellowship with [facility].   My desire is to provide you with the best service through preventive medicine and supportive care. I consider you a part of the medical team and value your input. I work diligently to ensure that you are heard and your needs are met in a safe and effective manner. I want you to feel comfortable with me as your provider and want you to know that your health concerns are important to me.  For your information, our office hours are: Monday, Tuesday, and Thursday 8:00 AM - 5:00 PM Wednesday and Friday 8:00 AM - 12:00 PM.   In my time away from the office I am teaching new APP's within the system and am  unavailable, but my partner, Dr. Burnard Bunting is in the office for emergent needs.   If you have questions or concerns, please call our office at [phone] or send [place] a MyChart message and we will respond as quickly as possible.  ____________________________________________________________ MyChart:  For all urgent or time sensitive needs we ask that you please call the office to avoid delays. Our number is [phone]. MyChart is not constantly monitored and due to the large volume of messages a day, replies may take up to 72 business hours.  MyChart Policy: MyChart allows for you to see your visit notes, after visit summary, provider recommendations, lab and tests results, make an appointment, request refills, and contact your provider or the office for non-urgent questions or concerns. Providers are seeing patients during normal business hours and do not have built in time to review MyChart messages.  We ask that you allow a minimum of 3 business days for responses to [employer]. For this reason, please do not send urgent requests through [place]. Please call the office at [phone]. New and ongoing conditions may require a visit. We have virtual and in person visit available for your convenience.  Complex MyChart concerns may require a visit. Your provider may request you schedule a virtual or in person visit to ensure we are providing the best care possible. MyChart messages sent after 11:00 AM on Friday will not be received by the provider until Monday morning.  Lab and Test Results: You will receive your lab and test results on MyChart as soon as they are completed and results have been sent by the lab or testing facility. Due to this service, you will receive your results BEFORE your provider.  I review lab and tests results each morning prior to seeing patients. Some results require collaboration with other providers to ensure you are receiving the most appropriate care. For this  reason, we ask that you please allow a minimum of 3-5 business days from the time the ALL results have been received for your provider to receive and review lab and test results and contact you about these.  Most lab and test result comments from the provider will be sent through [place]. Your provider may recommend changes to the plan of care, follow-up visits, repeat testing, ask questions, or request an office visit to discuss these results. You may reply directly to this message or call the office at [phone] to provide information for the provider or set up an appointment. In some instances, you will be called with test results and recommendations. Please let us know if this is preferred and we will make note of this in your chart to provide this for you.    If you have not heard a response to your lab or test results in 5 business days from all results returning to [place], please call the office to let us know. We ask that you please avoid calling prior to this time unless there is an emergent concern. Due to high call volumes, this can delay the resulting process.  After Hours: For all non-emergency after hours needs, please call the office at [phone] and select the option to reach the on-call provider service. On-call services are shared between multiple [place] offices and therefore it will not be possible to speak directly with your provider. On-call providers may provide medical advice and recommendations, but are unable to provide refills for maintenance medications.  For all emergency or urgent medical needs after normal business hours, we recommend that you seek care at the closest Urgent Care or Emergency Department to ensure appropriate treatment in a timely manner.  [facility] has a 24 hour emergency room located on the ground floor for your convenience.   Urgent Concerns During the Business Day Providers are seeing patients from 8AM to [place] with a  busy schedule and are most often not able to respond to non-urgent calls until the end of the day or the next business day. If you should have URGENT concerns during the day, please call and speak to the nurse or schedule a same day appointment so that we can address your concern without delay.   Thank you, again, for choosing me as your health care partner. I appreciate your trust and look forward to learning more about you.   Worthy Keeler, DNP, AGNP-c ___________________________________________________________  Health Maintenance Recommendations Screening Testing Mammogram Every 1 -2 years based on history and risk factors Starting at age 46 Pap Smear Ages 21-39 every 3 years Ages 33-65 every 5 years with HPV testing More frequent testing may be required based on results and history Colon Cancer Screening Every 1-10 years based on test performed, risk factors, and history Starting at age 46 Bone Density Screening Every 2-10 years based on history Starting at age 43 for women Recommendations for men differ based on medication usage, history, and risk factors AAA Screening One time ultrasound Men 55-72 years old who have  every smoked Lung Cancer Screening Low Dose Lung CT every 12 months Age 25-80 years with a 30 pack-year smoking history who still smoke or who have quit within the last 15 years  Screening Labs Routine  Labs: Complete Blood Count (CBC), Complete Metabolic Panel (CMP), Cholesterol (Lipid Panel) Every 6-12 months based on history and medications May be recommended more frequently based on current conditions or previous results Hemoglobin A1c Lab Every 3-12 months based on history and previous results Starting at age 28 or earlier with diagnosis of diabetes, high cholesterol, BMI >26, and/or risk factors Frequent monitoring for patients with diabetes to ensure blood sugar control Thyroid Panel (TSH w/ T3 & T4) Every 6 months based on history, symptoms, and risk  factors May be repeated more often if on medication HIV One time testing for all patients 52 and older May be repeated more frequently for patients with increased risk factors or exposure Hepatitis C One time testing for all patients 20 and older May be repeated more frequently for patients with increased risk factors or exposure Gonorrhea, Chlamydia Every 12 months for all sexually active persons 13-24 years Additional monitoring may be recommended for those who are considered high risk or who have symptoms PSA Men 79-67 years old with risk factors Additional screening may be recommended from age 54-69 based on risk factors, symptoms, and history  Vaccine Recommendations Tetanus Booster All adults every 10 years Flu Vaccine All patients 6 months and older every year COVID Vaccine All patients 12 years and older Initial dosing with booster May recommend additional booster based on age and health history HPV Vaccine 2 doses all patients age 57-26 Dosing may be considered for patients over 26 Shingles Vaccine (Shingrix) 2 doses all adults 65 years and older Pneumonia (Pneumovax 23) All adults 15 years and older May recommend earlier dosing based on health history Pneumonia (Prevnar 17) All adults 60 years and older Dosed 1 year after Pneumovax 23  Additional Screening, Testing, and Vaccinations may be recommended on an individualized basis based on family history, health history, risk factors, and/or exposure.  __________________________________________________________  Diet Recommendations for All Patients  I recommend that all patients maintain a diet low in saturated fats, carbohydrates, and cholesterol. While this can be challenging at first, it is not impossible and small changes can make big differences.  Things to try: Decreasing the amount of soda, sweet tea, and/or juice to one or less per day and replace with water While water is always the first choice, if you do  not like water you may consider adding a water additive without sugar to improve the taste other sugar free drinks Replace potatoes with a brightly colored vegetable at dinner Use healthy oils, such as canola oil or olive oil, instead of butter or hard margarine Limit your bread intake to two pieces or less a day Replace regular pasta with low carb pasta options Bake, broil, or grill foods instead of frying Monitor portion sizes  Eat smaller, more frequent meals throughout the day instead of large meals  An important thing to remember is, if you love foods that are not great for your health, you don't have to give them up completely. Instead, allow these foods to be a reward when you have done well. Allowing yourself to still have special treats every once in a while is a nice way to tell yourself thank you for working hard to keep yourself healthy.   Also remember that every day is a new day.  If you have a bad day and "fall off the wagon", you can still climb right back up and keep moving along on your journey!  We have resources available to help you!  Some websites that may be helpful include: www.http://carter.biz/  Www.[employer]Well[employer].com _____________________________________________________________  Activity Recommendations for All Patients  I recommend that all adults get at least 20 minutes of moderate physical activity that elevates your heart rate at least 5 days out of the week.  Some examples include: Walking or jogging at a pace that allows you to carry on a conversation Cycling (stationary bike or outdoors) Water aerobics Yoga Weight lifting Dancing If physical limitations prevent you from putting stress on your joints, exercise in a pool or seated in a chair are excellent options.  Do determine your MAXIMUM heart rate for activity: YOUR AGE - 220 = MAX HeartRate   Remember! Do not push yourself too hard.  Start slowly and build up your pace, speed, weight, time in exercise,  etc.  Allow your body to rest between exercise and get good sleep. You will need more water than normal when you are exerting yourself. Do not wait until you are thirsty to drink. Drink with a purpose of getting in at least 8, 8 ounce glasses of water a day plus more depending on how much you exercise and sweat.    If you begin to develop dizziness, chest pain, abdominal pain, jaw pain, shortness of breath, headache, vision changes, lightheadedness, or other concerning symptoms, stop the activity and allow your body to rest. If your symptoms are severe, seek emergency evaluation immediately. If your symptoms are concerning, but not severe, please let us know so that we can recommend further evaluation.

## 2022-02-28 NOTE — Assessment & Plan Note (Signed)
Chronic.  She has maintained sobriety for 1 year at this time. Extensive discussion with patient today on her journey to recovery and the difficulties that she has faced.  I am very proud of her for her efforts and the changes that she has been making in her life over the last year.  Patient does appear to have excellent support system and a very good understanding of her disease process and the importance of maintaining sobriety.  Patient is aware that no alcohol consumption would be safe for her.  Encouraged her to continue with her therapeutic services and reach out if she has any concerns for relapse.

## 2022-02-28 NOTE — Assessment & Plan Note (Signed)
Chronic.  No alarm symptoms present at this time.  Previous history of alcohol abuse with severe liver destruction.  Patient is working on losing weight and actively successful in her efforts.  Recommend continue with weight loss efforts and avoidance of all alcohol and liver damaging medication.  We will plan to follow-up with liver studies in 3 months unless needed sooner.

## 2022-02-28 NOTE — Assessment & Plan Note (Signed)
Chronic.  Currently followed with psychiatry and psychology.  At this time no alarm symptoms are present.  She does appear well controlled on her current medication regimen and counseling schedule.  History of significant trauma throughout childhood and adult alcohol abuse.  She is managing both very well at this time.  Encourage patient to report any new or worsening symptoms immediately.  We will continue to monitor.

## 2022-02-28 NOTE — Assessment & Plan Note (Signed)
Chronic.  Blood pressure slightly elevated 134/81 today.  Well-controlled at home.  Currently on spironolactone with no other cardiac medications.  No alarm symptoms present at this time.  Recent labs reviewed today from healthy weight and wellness.  We will continue to monitor quarterly.

## 2022-02-28 NOTE — Assessment & Plan Note (Signed)
Chronic.  Previous success with physical therapy exercises.  We will resend referral today to restart therapy.  No alarm symptoms present at this time.

## 2022-02-28 NOTE — Assessment & Plan Note (Signed)
Chronic.  Patient currently managing with diet and exercise.  She is working with healthy weight and wellness.  Recent A1c under good control.  Very proud of patient for her efforts and success.  We will continue to monitor.

## 2022-03-05 ENCOUNTER — Other Ambulatory Visit (INDEPENDENT_AMBULATORY_CARE_PROVIDER_SITE_OTHER): Payer: Self-pay | Admitting: Family Medicine

## 2022-03-05 ENCOUNTER — Encounter (INDEPENDENT_AMBULATORY_CARE_PROVIDER_SITE_OTHER): Payer: Self-pay | Admitting: Nurse Practitioner

## 2022-03-05 ENCOUNTER — Ambulatory Visit (INDEPENDENT_AMBULATORY_CARE_PROVIDER_SITE_OTHER): Payer: 59 | Admitting: Nurse Practitioner

## 2022-03-05 VITALS — BP 110/75 | HR 86 | Temp 98.2°F | Ht 63.0 in | Wt 249.0 lb

## 2022-03-05 DIAGNOSIS — E669 Obesity, unspecified: Secondary | ICD-10-CM

## 2022-03-05 DIAGNOSIS — Z6841 Body Mass Index (BMI) 40.0 and over, adult: Secondary | ICD-10-CM

## 2022-03-05 DIAGNOSIS — R638 Other symptoms and signs concerning food and fluid intake: Secondary | ICD-10-CM

## 2022-03-05 NOTE — Telephone Encounter (Signed)
Brandi Kirk

## 2022-03-06 DIAGNOSIS — R69 Illness, unspecified: Secondary | ICD-10-CM | POA: Diagnosis not present

## 2022-03-06 DIAGNOSIS — F411 Generalized anxiety disorder: Secondary | ICD-10-CM | POA: Diagnosis not present

## 2022-03-06 NOTE — Progress Notes (Unsigned)
Chief Complaint:   OBESITY Brandi Kirk is here to discuss her progress with her obesity treatment plan along with follow-up of her obesity related diagnoses. Brandi Kirk is on keeping a food journal and adhering to recommended goals of 1100 calories and 80-90 grams of protein and states she is following her eating plan approximately 60-70% of the time. Brandi Kirk states she is doing 0 minutes 0 times per week.  Today's visit was #: 5 Starting weight: 262 lbs Starting date: 11/27/2021 Today's weight: 249 lbs Today's date: 03/05/2022 Total lbs lost to date: 13 lbs Total lbs lost since last in-office visit: 4 lbs  Interim History: Brandi Kirk overall is doing well with weight loss. She is struggling with hunger and cravings and emotional eating. She saw her psychiatrist 2 weeks ago and was started on Prozac 20 mg. She notes under a lot of stress. She is a year sober on 03/06/2022. Her younger brother is moving in with her tomorrow after getting out of jail.  She is seeing her psychiatrist on a regular basis.   Subjective:   1. Abnormal cravings-  medication induced Brandi Kirk is taking Qsymia 7.5-46 mg. She denies side effects. Her Qsymia dose was increase after her last visit due to increased appetite and cravings. She reports she never received the new prescription for the pharmacy.    Assessment/Plan:   1. Abnormal cravings-  medication induced We will call pharmacy to check upon her Qsymia.  2. Obesity with current BMI of 44.1 Brandi Kirk is currently in the action stage of change. As such, her goal is to continue with weight loss efforts. She has agreed to keeping a food journal and adhering to recommended goals of 1100 calories and 80 plus grams of protein.   Brandi Kirk will increase protein intake.   Exercise goals:  Brandi Kirk will be starting physical therapy in 2 weeks.   Behavioral modification strategies: increasing lean protein intake, increasing water intake, meal planning and cooking strategies, and  planning for success.  Brandi Kirk has agreed to follow-up with our clinic in 4 weeks. She was informed of the importance of frequent follow-up visits to maximize her success with intensive lifestyle modifications for her multiple health conditions.   Objective:   Blood pressure 110/75, pulse 86, temperature 98.2 F (36.8 C), height 5' 3"  (1.6 m), weight 249 lb (112.9 kg), SpO2 99 %. Body mass index is 44.11 kg/m.  General: Cooperative, alert, well developed, in no acute distress. HEENT: Conjunctivae and lids unremarkable. Cardiovascular: Regular rhythm.  Lungs: Normal work of breathing. Neurologic: No focal deficits.   Lab Results  Component Value Date   CREATININE 0.80 11/27/2021   BUN 8 11/27/2021   NA 137 11/27/2021   K 4.5 11/27/2021   CL 102 11/27/2021   CO2 20 11/27/2021   Lab Results  Component Value Date   ALT 33 (H) 11/27/2021   AST 24 11/27/2021   ALKPHOS 125 (H) 11/27/2021   BILITOT 0.9 11/27/2021   Lab Results  Component Value Date   HGBA1C 5.3 11/27/2021   HGBA1C 5.3 04/20/2018   HGBA1C 5.2 11/25/2016   Lab Results  Component Value Date   INSULIN 9.2 11/27/2021   Lab Results  Component Value Date   TSH 1.030 11/27/2021   Lab Results  Component Value Date   CHOL 161 11/27/2021   HDL 36 (L) 11/27/2021   LDLCALC 109 (H) 11/27/2021   TRIG 86 11/27/2021   CHOLHDL 5 05/02/2020   Lab Results  Component Value Date  VD25OH 38.2 11/27/2021   VD25OH 36.55 07/17/2021   VD25OH 42.46 05/02/2020   Lab Results  Component Value Date   WBC 6.9 11/27/2021   HGB 13.9 11/27/2021   HCT 40.6 11/27/2021   MCV 89 11/27/2021   PLT 287 11/27/2021   Lab Results  Component Value Date   IRON 70 07/17/2021   TIBC 303 07/17/2021   FERRITIN 124 07/17/2021   Attestation Statements:   Reviewed by clinician on day of visit: allergies, medications, problem list, medical history, surgical history, family history, social history, and previous encounter notes.   I,  Lizbeth Bark, RMA, am acting as Location manager for [name], FNP.  I have reviewed the above documentation for accuracy and completeness, and I agree with the above. [name], FNP

## 2022-03-10 ENCOUNTER — Other Ambulatory Visit: Payer: Self-pay | Admitting: Family Medicine

## 2022-03-10 MED ORDER — SPIRONOLACTONE 25 MG PO TABS: 25.0000 mg | ORAL_TABLET | Freq: Every day | ORAL | 3 refills | Status: DC

## 2022-03-11 ENCOUNTER — Other Ambulatory Visit (INDEPENDENT_AMBULATORY_CARE_PROVIDER_SITE_OTHER): Payer: Self-pay | Admitting: Bariatrics

## 2022-03-11 ENCOUNTER — Encounter (HOSPITAL_BASED_OUTPATIENT_CLINIC_OR_DEPARTMENT_OTHER): Payer: Self-pay | Admitting: Nurse Practitioner

## 2022-03-11 ENCOUNTER — Telehealth (INDEPENDENT_AMBULATORY_CARE_PROVIDER_SITE_OTHER): Payer: 59 | Admitting: Psychology

## 2022-03-11 DIAGNOSIS — R69 Illness, unspecified: Secondary | ICD-10-CM | POA: Diagnosis not present

## 2022-03-11 DIAGNOSIS — F5089 Other specified eating disorder: Secondary | ICD-10-CM | POA: Diagnosis not present

## 2022-03-11 DIAGNOSIS — R638 Other symptoms and signs concerning food and fluid intake: Secondary | ICD-10-CM

## 2022-03-11 DIAGNOSIS — F4320 Adjustment disorder, unspecified: Secondary | ICD-10-CM

## 2022-03-11 MED ORDER — QSYMIA 11.25-69 MG PO CP24: 1.0000 | ORAL_CAPSULE | Freq: Every day | ORAL | 0 refills | Status: DC

## 2022-03-17 NOTE — Progress Notes (Unsigned)
  Office: [phone]  /  Fax: [phone]    Date: 03/31/2022   Appointment Start Time: *** Duration: *** minutes Provider: Glennie Isle, Psy.D. Type of Session: Individual Therapy  Location of Patient: {gbptloc:23249} (private location) Location of Provider: Provider's Home (private office) Type of Contact: Telepsychological Visit via MyChart Video Visit  Session Content: Brandi Kirk is a 36 y.o. female presenting for a follow-up appointment to address the previously established treatment goal of increasing coping skills.Today's appointment was a telepsychological visit. Brandi Kirk provided verbal consent for today's telepsychological appointment and she is aware she is responsible for securing confidentiality on her end of the session. Prior to proceeding with today's appointment, Brandi Kirk's physical location at the time of this appointment was obtained as well a phone number she could be reached at in the event of technical difficulties. Brandi Kirk and this provider participated in today's telepsychological service.   This provider conducted a brief check-in. *** Brandi Kirk was receptive to today's appointment as evidenced by openness to sharing, responsiveness to feedback, and {gbreceptiveness:23401}.  Mental Status Examination:  Appearance: {Appearance:22431} Behavior: {Behavior:22445} Mood: {gbmood:21757} Affect: {Affect:22436} Speech: {Speech:22432} Eye Contact: {Eye Contact:22433} Psychomotor Activity: {Motor Activity:22434} Gait: {gbgait:23404} Thought Process: {thought process:22448}  Thought Content/Perception: {disturbances:22451} Orientation: {Orientation:22437} Memory/Concentration: {gbcognition:22449} Insight: {Insight:22446} Judgment: {Insight:22446}  Interventions:  {Interventions for Progress Notes:23405}  DSM-5 Diagnosis(es):  F50.89 Other Specified Feeding or Eating Disorder, Emotional Eating Behaviors and  F43.20 Adjustment Disorder, Unspecified   Treatment Goal & Progress:  During the initial appointment with this provider, the following treatment goal was established: increase coping skills. Brandi Kirk has demonstrated progress in her goal as evidenced by {gbtxprogress:22839}. Brandi Kirk also {gbtxprogress2:22951}.  Plan: The next appointment is scheduled for *** at ***, which will be via MyChart Video Visit. The next session will focus on {Plan for Next Appointment:23400}.

## 2022-03-18 DIAGNOSIS — F4311 Post-traumatic stress disorder, acute: Secondary | ICD-10-CM | POA: Diagnosis not present

## 2022-03-18 DIAGNOSIS — R69 Illness, unspecified: Secondary | ICD-10-CM | POA: Diagnosis not present

## 2022-03-18 DIAGNOSIS — F411 Generalized anxiety disorder: Secondary | ICD-10-CM | POA: Diagnosis not present

## 2022-03-31 ENCOUNTER — Ambulatory Visit (INDEPENDENT_AMBULATORY_CARE_PROVIDER_SITE_OTHER): Payer: 59 | Admitting: Nurse Practitioner

## 2022-03-31 ENCOUNTER — Telehealth (INDEPENDENT_AMBULATORY_CARE_PROVIDER_SITE_OTHER): Payer: 59 | Admitting: Psychology

## 2022-03-31 NOTE — Progress Notes (Signed)
Office: [phone]  /  Fax: [phone]    Date: 04/07/2022   Appointment Start Time: 12:32pm Duration: 29 minutes Provider: Glennie Isle, Psy.D. Type of Session: Individual Therapy  Location of Patient: Home (private location) Location of Provider: Provider's Home (private office) Type of Contact: Telepsychological Visit via MyChart Video Visit  Session Content: Brandi Kirk is a 36 y.o. female presenting for a follow-up appointment to address the previously established treatment goal of increasing coping skills.Today's appointment was a telepsychological visit. Brandi Kirk provided verbal consent for today's telepsychological appointment and she is aware she is responsible for securing confidentiality on her end of the session. Prior to proceeding with today's appointment, Brandi Kirk's physical location at the time of this appointment was obtained as well a phone number she could be reached at in the event of technical difficulties. Brandi Kirk and this provider participated in today's telepsychological service.   This provider conducted a brief check-in. Brandi Kirk shared, "It was a pretty stressful month." She reported changes in her appetite due to her stress and eating "one big meal a day." Brandi Kirk denied engagement in binge and emotional eating behaviors. Since her last appointment with Brandi Pacific, NP she started eating snacks and is not "gorging." Further explored and processed. Notably, she reported a reduction in stressors and an improvement in appetite. Brandi Kirk continues to deny cravings for alcohol. Based on current symptomatology that is also impacting eating habits, this provider again discussed traditional therapeutic services and reviewed her role with the clinic. This provider also shared about [email or web address]. Brandi Kirk was receptive as evidenced by her agreeing to establish care with a provider prior to the next appointment with this provider. Moreover, Brandi Kirk described engaging in self-care  by painting. Positive reinforcement was provided. Given the improvement in emotional and binge eating behaviors, termination planing was discussed and the recommendation for traditional therapeutic services was re-iterated. Brandi Kirk was receptive to a follow-up appointment in 3-4 weeks and an additional follow-up/termination appointment in 3-4 weeks after that. Reviewed learned skills to help with stress management and subsequent emotional/binge eating behaviors. Brandi Kirk agreed to engage in the following between now and the next appointment with this provider: walking, painting, reading, and journaling. Overall, Brandi Kirk was receptive to today's appointment as evidenced by openness to sharing, responsiveness to feedback, and willingness to continue engaging in learned skills.  Mental Status Examination:  Appearance: neat Behavior: appropriate to circumstances Mood: neutral Affect: mood congruent Speech: WNL Eye Contact: intermittent  Psychomotor Activity: WNL Gait: unable to assess Thought Process: linear, logical, and goal directed and denies suicidal, homicidal, and self-harm ideation, plan and intent since the last appointment with this provider Thought Content/Perception: no hallucinations, delusions, bizarre thinking or behavior endorsed or observed Orientation: AAOx4 Memory/Concentration: memory, attention, language, and fund of knowledge intact  Insight: good Judgment: fair  Interventions:  Conducted a brief chart review Conducted a risk assessment Provided empathic reflections and validation Provided positive reinforcement Employed supportive psychotherapy interventions to facilitate reduced distress and to improve coping skills with identified stressors Reviewed learned skills Discussed termination planning Recommended/discussed option for longer-term therapeutic services  DSM-5 Diagnosis(es):  F50.89 Other Specified Feeding or Eating Disorder, Emotional Eating Behaviors and  F43.20  Adjustment Disorder, Unspecified   Treatment Goal & Progress: During the initial appointment with this provider, the following treatment goal was established: increase coping skills. Brandi Kirk has demonstrated progress in her goal as evidenced by increased awareness of hunger patterns, increased awareness of triggers for emotional eating behaviors, reduction in emotional eating behaviors , and reduction in binge  eating behaviors. Brandi Kirk also continues to demonstrate willingness to engage in learned skill(s).  Plan: The next appointment is scheduled for 05/05/2022 at 2:30pm, which will be via MyChart Video Visit. The next session will focus on working towards the established treatment goal.

## 2022-04-02 ENCOUNTER — Encounter (INDEPENDENT_AMBULATORY_CARE_PROVIDER_SITE_OTHER): Payer: Self-pay | Admitting: Nurse Practitioner

## 2022-04-02 ENCOUNTER — Other Ambulatory Visit: Payer: Self-pay | Admitting: Family Medicine

## 2022-04-02 ENCOUNTER — Ambulatory Visit (INDEPENDENT_AMBULATORY_CARE_PROVIDER_SITE_OTHER): Payer: 59 | Admitting: Nurse Practitioner

## 2022-04-02 VITALS — BP 108/75 | HR 83 | Temp 98.4°F | Ht 63.0 in | Wt 234.0 lb

## 2022-04-02 DIAGNOSIS — E669 Obesity, unspecified: Secondary | ICD-10-CM

## 2022-04-02 DIAGNOSIS — Z6841 Body Mass Index (BMI) 40.0 and over, adult: Secondary | ICD-10-CM

## 2022-04-02 DIAGNOSIS — R638 Other symptoms and signs concerning food and fluid intake: Secondary | ICD-10-CM

## 2022-04-02 NOTE — Progress Notes (Signed)
Chief Complaint:   OBESITY Brandi Kirk is here to discuss her progress with her obesity treatment plan along with follow-up of her obesity related diagnoses. Brandi Kirk is on keeping a food journal and adhering to recommended goals of 1100 calories and 80+ grams of protein daily and states she is following her eating plan approximately 85% of the time. Brandi Kirk states she was walking on vacation for 60-120 minutes 6 times per week.  Today's visit was #: 6 Starting weight: 262 lbs Starting date: 11/27/2021 Today's weight: 234 lbs Today's date: 04/02/2022 Total lbs lost to date: 28 Total lbs lost since last in-office visit: 15  Interim History: Brandi Kirk has done well with weight loss since her last visit.  She is under a lot of stress at home.  She denies stress eating.  She got back from her vacation on July 3.  She walked around 4-5 miles per day while on vacation.  She is taking Qsymia 11.25-69 mg.  She denies side effects.  Her Prozac was increased 4 weeks ago to 40 mg, and she denies side effects.  Her initial visit with me was on 06/25/2021-weight was 286 pounds.  She denies hunger or cravings.  She is doing intermittent fasting 12-7.  Subjective:   1. Abnormal cravings-  medication induced Brandi Kirk is taking Qsymia 11.25-69 mg, and she denies side effects.  She notes this helps with cravings and hunger.  Assessment/Plan:   1. Abnormal cravings-  medication induced Brandi Kirk will continue Qsymia 11.25-69 mg once daily, and we will refill for 1 month.  Side effects were discussed.  RF Qsymia 11.25-69mg.  Side effects discussed.    2. Obesity with current BMI of 44.5 Brandi Kirk is currently in the action stage of change. As such, her goal is to continue with weight loss efforts. She has agreed to keeping a food journal and adhering to recommended goals of 1100 calories and 80-90 grams of protein daily.   We discussed various medication options to help Brandi Kirk with her weight loss efforts and we both  agreed to continue Qsymia 11.25-6 9 mg once daily, and we will refill for 1 month.  Exercise goals: No exercise has been prescribed at this time.  Behavioral modification strategies: increasing lean protein intake, increasing water intake, and planning for success.  Brandi Kirk has agreed to follow-up with our clinic in 3 weeks. She was informed of the importance of frequent follow-up visits to maximize her success with intensive lifestyle modifications for her multiple health conditions.   Objective:   Blood pressure 108/75, pulse 83, temperature 98.4 F (36.9 C), height 5' 3"  (1.6 m), weight 234 lb (106.1 kg), SpO2 97 %. Body mass index is 41.45 kg/m.  General: Cooperative, alert, well developed, in no acute distress. HEENT: Conjunctivae and lids unremarkable. Cardiovascular: Regular rhythm.  Lungs: Normal work of breathing. Neurologic: No focal deficits.   Lab Results  Component Value Date   CREATININE 0.80 11/27/2021   BUN 8 11/27/2021   NA 137 11/27/2021   K 4.5 11/27/2021   CL 102 11/27/2021   CO2 20 11/27/2021   Lab Results  Component Value Date   ALT 33 (H) 11/27/2021   AST 24 11/27/2021   ALKPHOS 125 (H) 11/27/2021   BILITOT 0.9 11/27/2021   Lab Results  Component Value Date   HGBA1C 5.3 11/27/2021   HGBA1C 5.3 04/20/2018   HGBA1C 5.2 11/25/2016   Lab Results  Component Value Date   INSULIN 9.2 11/27/2021   Lab Results  Component Value Date   TSH 1.030 11/27/2021   Lab Results  Component Value Date   CHOL 161 11/27/2021   HDL 36 (L) 11/27/2021   LDLCALC 109 (H) 11/27/2021   TRIG 86 11/27/2021   CHOLHDL 5 05/02/2020   Lab Results  Component Value Date   VD25OH 38.2 11/27/2021   VD25OH 36.55 07/17/2021   VD25OH 42.46 05/02/2020   Lab Results  Component Value Date   WBC 6.9 11/27/2021   HGB 13.9 11/27/2021   HCT 40.6 11/27/2021   MCV 89 11/27/2021   PLT 287 11/27/2021   Lab Results  Component Value Date   IRON 70 07/17/2021   TIBC 303  07/17/2021   FERRITIN 124 07/17/2021   Attestation Statements:   Reviewed by clinician on day of visit: allergies, medications, problem list, medical history, surgical history, family history, social history, and previous encounter notes.   Wilhemena Durie, am acting as Location manager for [employer], FNP-C.  I have reviewed the above documentation for accuracy and completeness, and I agree with the above. [name], FNP

## 2022-04-07 ENCOUNTER — Telehealth (INDEPENDENT_AMBULATORY_CARE_PROVIDER_SITE_OTHER): Payer: 59 | Admitting: Psychology

## 2022-04-07 ENCOUNTER — Other Ambulatory Visit (INDEPENDENT_AMBULATORY_CARE_PROVIDER_SITE_OTHER): Payer: Self-pay | Admitting: Bariatrics

## 2022-04-07 DIAGNOSIS — R638 Other symptoms and signs concerning food and fluid intake: Secondary | ICD-10-CM

## 2022-04-07 DIAGNOSIS — F4320 Adjustment disorder, unspecified: Secondary | ICD-10-CM

## 2022-04-07 DIAGNOSIS — F5089 Other specified eating disorder: Secondary | ICD-10-CM | POA: Diagnosis not present

## 2022-04-07 DIAGNOSIS — R69 Illness, unspecified: Secondary | ICD-10-CM | POA: Diagnosis not present

## 2022-04-07 MED ORDER — QSYMIA 11.25-69 MG PO CP24
11.2500 | ORAL_CAPSULE | Freq: Every day | ORAL | 0 refills | Status: DC
Start: 2022-04-07 — End: 2022-05-14

## 2022-04-17 DIAGNOSIS — F411 Generalized anxiety disorder: Secondary | ICD-10-CM | POA: Diagnosis not present

## 2022-04-17 DIAGNOSIS — R69 Illness, unspecified: Secondary | ICD-10-CM | POA: Diagnosis not present

## 2022-04-21 NOTE — Progress Notes (Addendum)
Office: [phone]  /  Fax: [phone]    Date: 05/05/2022   Appointment Start Time: 2:34pm Duration: 32 minutes Provider: Glennie Isle, Psy.D. Type of Session: Individual Therapy  Location of Patient: Home (private location) Location of Provider: Provider's Home (private office) Type of Contact: Telepsychological Visit via MyChart Video Visit  Session Content: Brandi Kirk is a 36 y.o. female presenting for a follow-up appointment to address the previously established treatment goal of increasing coping skills.Today's appointment was a telepsychological visit. Brandi Kirk provided verbal consent for today's telepsychological appointment and she is aware she is responsible for securing confidentiality on her end of the session. Prior to proceeding with today's appointment, Brandi Kirk's physical location at the time of this appointment was obtained as well a phone number she could be reached at in the event of technical difficulties. Brandi Kirk and this provider participated in today's telepsychological service.   This provider conducted a brief check-in. Brandi Kirk shared about recent events, including ongoing conflict with her brother that impacted her relationship with her husband. Since the conflict, she indicated she contacted her psychiatric provider for support and her medications were adjusted. Brandi Kirk denied any concerns regarding safety for herself, son, or husband as it relates to conflict with her brother. She was receptive to contacting law enforcement should there ever be any concerns. She clarified the conflict has involved her and her brother arguing and her brother not following boundaries established. Due to the aforementioned, she indicated she is stress eating ice cream at times. Nevertheless, she reported she continues to lose weight. This provider checked-in regarding establishing care with a therapist for traditional outpatient psychotherapy. She acknowledged she is "stalling" as she feels she has  made "a connection" with this provider. Associated thoughts and feelings processed. Additionally, her psychiatric provider reportedly recommended she wait for a provider with their practice. Nevertheless, she was receptive to this provider placing a referral with [facility] to address ongoing stressors. Remainder of session focused on transitioning to another provider and reviewing learned skills to assist with stress eating (e.g., painting). She was agreeable to planning to paint every evening as that is when she typically engages in emotional eating behaviors. Overall, Brandi Kirk was receptive to today's appointment as evidenced by openness to sharing, responsiveness to feedback, and willingness to continue engaging in learned skills.  Mental Status Examination:  Appearance: neat Behavior: appropriate to circumstances Mood: sad Affect: mood congruent Speech: WNL Eye Contact: appropriate Psychomotor Activity: WNL Gait: unable to assess Thought Process: linear, logical, and goal directed and denies suicidal, homicidal, and self-harm ideation, plan and intent  Thought Content/Perception: no hallucinations, delusions, bizarre thinking or behavior endorsed or observed Orientation: AAOx4 Memory/Concentration: memory, attention, language, and fund of knowledge intact  Insight: good Judgment: fair  Interventions:  Conducted a brief chart review Conducted a risk assessment Provided empathic reflections and validation Employed supportive psychotherapy interventions to facilitate reduced distress and to improve coping skills with identified stressors Reviewed learned skills  DSM-5 Diagnosis(es):  F50.89 Other Specified Feeding or Eating Disorder, Emotional Eating Behaviors and  F43.20 Adjustment Disorder, Unspecified   Treatment Goal & Progress: During the initial appointment with this provider, the following treatment goal was established: increase coping skills. Brandi Kirk has  demonstrated progress in her goal as evidenced by increased awareness of hunger patterns, increased awareness of triggers for emotional eating behaviors, reduction in emotional eating behaviors , and reduction in binge eating behaviors. Brandi Kirk also continues to demonstrate willingness to engage in learned skill(s).  Plan: The next appointment is  scheduled for 05/26/2022 at 11:30am, which will be via MyChart Video Visit. The next session will focus on working towards the established treatment goal. A referral was placed with [facility]. Brandi Kirk was receptive to this provider re-sending previously shared referral options for therapeutic services.

## 2022-04-23 ENCOUNTER — Encounter (INDEPENDENT_AMBULATORY_CARE_PROVIDER_SITE_OTHER): Payer: Self-pay | Admitting: Nurse Practitioner

## 2022-04-23 ENCOUNTER — Ambulatory Visit (INDEPENDENT_AMBULATORY_CARE_PROVIDER_SITE_OTHER): Payer: 59 | Admitting: Nurse Practitioner

## 2022-04-23 VITALS — BP 101/69 | HR 93 | Temp 99.3°F | Ht 63.0 in | Wt 227.0 lb

## 2022-04-23 DIAGNOSIS — E669 Obesity, unspecified: Secondary | ICD-10-CM | POA: Diagnosis not present

## 2022-04-23 DIAGNOSIS — R638 Other symptoms and signs concerning food and fluid intake: Secondary | ICD-10-CM | POA: Diagnosis not present

## 2022-04-23 DIAGNOSIS — Z6841 Body Mass Index (BMI) 40.0 and over, adult: Secondary | ICD-10-CM

## 2022-04-28 NOTE — Progress Notes (Signed)
Chief Complaint:   OBESITY Brandi Kirk is here to discuss her progress with her obesity treatment plan along with follow-up of her obesity related diagnoses. Brandi Kirk is on keeping a food journal and adhering to recommended goals of 1100 calories and 80-90 grams of protein and states she is following her eating plan approximately 95-98% of the time. Brandi Kirk states she is walking 30 minutes 3 times per week.  Today's visit was #: 7 Starting weight: 262 lbs Starting date: 11/27/2021 Today's weight: 227 lbs Today's date: 04/23/2022 Total lbs lost to date: 35 lbs Total lbs lost since last in-office visit: 7  Interim History: Brandi Kirk has overall done well with weight loss. Has had one episode of stress eating since her last visit. She is taking Qsymia 11.25-69 mg. Denies any side effects. Some increased thirst. Drinking water daily. Has increased walking since last visit.  Subjective:   1. Abnormal cravings-  medication induced Brandi Kirk is currently Qsymia 11.25-69 mg. Denies any side effects. Denies any chest pain,shortness of breath or palpitations. Some increased thirst.  Assessment/Plan:   1. Abnormal cravings-  medication induced Refill Qsymia 11.25-69 by mouth daily for 1 month with 0 refills. Side effects discussed.   2. Obesity with current BMI of 40.3 Brandi Kirk is currently in the action stage of change. As such, her goal is to continue with weight loss efforts. She has agreed to keeping a food journal and adhering to recommended goals of 1100 calories and 80-90 grams of protein.   Exercise goals: As is.  Behavioral modification strategies: increasing lean protein intake, increasing water intake, and no skipping meals.  Brandi Kirk has agreed to follow-up with our clinic in 3 weeks. She was informed of the importance of frequent follow-up visits to maximize her success with intensive lifestyle modifications for her multiple health conditions.   Objective:   Blood pressure 101/69, pulse 93,  temperature 99.3 F (37.4 C), height 5' 3"  (1.6 m), weight 227 lb (103 kg), SpO2 98 %. Body mass index is 40.21 kg/m.  General: Cooperative, alert, well developed, in no acute distress. HEENT: Conjunctivae and lids unremarkable. Cardiovascular: Regular rhythm.  Lungs: Normal work of breathing. Neurologic: No focal deficits.   Lab Results  Component Value Date   CREATININE 0.80 11/27/2021   BUN 8 11/27/2021   NA 137 11/27/2021   K 4.5 11/27/2021   CL 102 11/27/2021   CO2 20 11/27/2021   Lab Results  Component Value Date   ALT 33 (H) 11/27/2021   AST 24 11/27/2021   ALKPHOS 125 (H) 11/27/2021   BILITOT 0.9 11/27/2021   Lab Results  Component Value Date   HGBA1C 5.3 11/27/2021   HGBA1C 5.3 04/20/2018   HGBA1C 5.2 11/25/2016   Lab Results  Component Value Date   INSULIN 9.2 11/27/2021   Lab Results  Component Value Date   TSH 1.030 11/27/2021   Lab Results  Component Value Date   CHOL 161 11/27/2021   HDL 36 (L) 11/27/2021   LDLCALC 109 (H) 11/27/2021   TRIG 86 11/27/2021   CHOLHDL 5 05/02/2020   Lab Results  Component Value Date   VD25OH 38.2 11/27/2021   VD25OH 36.55 07/17/2021   VD25OH 42.46 05/02/2020   Lab Results  Component Value Date   WBC 6.9 11/27/2021   HGB 13.9 11/27/2021   HCT 40.6 11/27/2021   MCV 89 11/27/2021   PLT 287 11/27/2021   Lab Results  Component Value Date   IRON 70 07/17/2021   TIBC  303 07/17/2021   FERRITIN 124 07/17/2021   Attestation Statements:   Reviewed by clinician on day of visit: allergies, medications, problem list, medical history, surgical history, family history, social history, and previous encounter notes.  I, Brendell Tyus, RMA, am acting as transcriptionist for [name], FNP.  I have reviewed the above documentation for accuracy and completeness, and I agree with the above. [name], FNP

## 2022-05-01 DIAGNOSIS — R69 Illness, unspecified: Secondary | ICD-10-CM | POA: Diagnosis not present

## 2022-05-01 DIAGNOSIS — F411 Generalized anxiety disorder: Secondary | ICD-10-CM | POA: Diagnosis not present

## 2022-05-05 ENCOUNTER — Telehealth (INDEPENDENT_AMBULATORY_CARE_PROVIDER_SITE_OTHER): Payer: 59 | Admitting: Psychology

## 2022-05-05 DIAGNOSIS — F4320 Adjustment disorder, unspecified: Secondary | ICD-10-CM | POA: Diagnosis not present

## 2022-05-05 DIAGNOSIS — F5089 Other specified eating disorder: Secondary | ICD-10-CM | POA: Diagnosis not present

## 2022-05-05 DIAGNOSIS — R69 Illness, unspecified: Secondary | ICD-10-CM | POA: Diagnosis not present

## 2022-05-07 ENCOUNTER — Encounter (INDEPENDENT_AMBULATORY_CARE_PROVIDER_SITE_OTHER): Payer: Self-pay

## 2022-05-08 ENCOUNTER — Encounter (INDEPENDENT_AMBULATORY_CARE_PROVIDER_SITE_OTHER): Payer: Self-pay | Admitting: Nurse Practitioner

## 2022-05-08 ENCOUNTER — Telehealth (INDEPENDENT_AMBULATORY_CARE_PROVIDER_SITE_OTHER): Payer: Self-pay | Admitting: Bariatrics

## 2022-05-08 DIAGNOSIS — F411 Generalized anxiety disorder: Secondary | ICD-10-CM | POA: Diagnosis not present

## 2022-05-08 DIAGNOSIS — R69 Illness, unspecified: Secondary | ICD-10-CM | POA: Diagnosis not present

## 2022-05-08 NOTE — Telephone Encounter (Signed)
Pt has requested a refill of Phentermine-Topiramate (QSYMIA) 11.25-69 MG CP24 [[id number]]. Pt out of med. Please call pt at [phone] as she may need to have med sent to a local pharmacy.

## 2022-05-12 NOTE — Telephone Encounter (Signed)
Notified patient per Dr. Owens Shark that she has to waiting until next follow-up visit for medication refill. Patient verbalized understanding.

## 2022-05-14 ENCOUNTER — Encounter (INDEPENDENT_AMBULATORY_CARE_PROVIDER_SITE_OTHER): Payer: Self-pay | Admitting: Nurse Practitioner

## 2022-05-14 ENCOUNTER — Ambulatory Visit (INDEPENDENT_AMBULATORY_CARE_PROVIDER_SITE_OTHER): Payer: 59 | Admitting: Nurse Practitioner

## 2022-05-14 ENCOUNTER — Other Ambulatory Visit (INDEPENDENT_AMBULATORY_CARE_PROVIDER_SITE_OTHER): Payer: Self-pay | Admitting: Bariatrics

## 2022-05-14 VITALS — BP 97/60 | HR 74 | Temp 98.3°F | Ht 63.0 in | Wt 220.0 lb

## 2022-05-14 DIAGNOSIS — R638 Other symptoms and signs concerning food and fluid intake: Secondary | ICD-10-CM

## 2022-05-14 DIAGNOSIS — E669 Obesity, unspecified: Secondary | ICD-10-CM | POA: Diagnosis not present

## 2022-05-14 DIAGNOSIS — Z6839 Body mass index (BMI) 39.0-39.9, adult: Secondary | ICD-10-CM

## 2022-05-14 MED ORDER — QSYMIA 11.25-69 MG PO CP24: 11.2500 | ORAL_CAPSULE | Freq: Every day | ORAL | 0 refills | Status: DC

## 2022-05-21 NOTE — Progress Notes (Unsigned)
Chief Complaint:   OBESITY Brandi Kirk is here to discuss her progress with her obesity treatment plan along with follow-up of her obesity related diagnoses. Brandi Kirk is on keeping a food journal and adhering to recommended goals of 1100 calories and 80-90 grams of protein and states she is following her eating plan approximately 85% of the time. Brandi Kirk states she is exercising 0 minutes 0 times per week.  Today's visit was #: 8 Starting weight: 262 lbs Starting date: 11/27/2021 Today's weight: 220 lbs Today's date: 05/14/2022 Total lbs lost to date: 42 lbs Total lbs lost since last in-office visit: 7  Interim History: Brandi Kirk has overall done well with weight loss. Struggling with some stress eating but meeting protein goals. She is taking Qsymia 11.25-69 mg. Denies any side effects. Drinking water daily. She has not been exercising over the past 1-2 weeks. Her goal weight is 199 lbs. Her highest weight was 288 lbs. Recently her Seroquel dose was changed and she struggles with hunger and cravings. Dose was changed back last night.  Subjective:   1. Abnormal cravings-  medication induced Brandi Kirk is doing well on Qsymia dose. Denies any side effects. Denies any chest pain,shortness of breath or palpitations and dizziness.  Assessment/Plan:   1. Abnormal cravings-  medication induced We will refill Qsymia 11.25-69 mg once a day for 1 month with 0 refills. Side effects discussed.   2. Obesity with current BMI of 39.0 Brandi Kirk is currently in the action stage of change. As such, her goal is to continue with weight loss efforts. She has agreed to keeping a food journal and adhering to recommended goals of 1200 calories and 90 grams of protein.   Exercise goals: All adults should avoid inactivity. Some physical activity is better than none, and adults who participate in any amount of physical activity gain some health benefits.  Brandi Kirk has CPE and labs in Sept.  Behavioral modification  strategies: increasing water intake, no skipping meals, and meal planning and cooking strategies.  Brandi Kirk has agreed to follow-up with our clinic in 3 weeks. She was informed of the importance of frequent follow-up visits to maximize her success with intensive lifestyle modifications for her multiple health conditions.   Objective:   Blood pressure 97/60, pulse 74, temperature 98.3 F (36.8 C), height 5' 3"  (1.6 m), weight 220 lb (99.8 kg), SpO2 98 %. Body mass index is 38.97 kg/m.  General: Cooperative, alert, well developed, in no acute distress. HEENT: Conjunctivae and lids unremarkable. Cardiovascular: Regular rhythm.  Lungs: Normal work of breathing. Neurologic: No focal deficits.   Lab Results  Component Value Date   CREATININE 0.80 11/27/2021   BUN 8 11/27/2021   NA 137 11/27/2021   K 4.5 11/27/2021   CL 102 11/27/2021   CO2 20 11/27/2021   Lab Results  Component Value Date   ALT 33 (H) 11/27/2021   AST 24 11/27/2021   ALKPHOS 125 (H) 11/27/2021   BILITOT 0.9 11/27/2021   Lab Results  Component Value Date   HGBA1C 5.3 11/27/2021   HGBA1C 5.3 04/20/2018   HGBA1C 5.2 11/25/2016   Lab Results  Component Value Date   INSULIN 9.2 11/27/2021   Lab Results  Component Value Date   TSH 1.030 11/27/2021   Lab Results  Component Value Date   CHOL 161 11/27/2021   HDL 36 (L) 11/27/2021   LDLCALC 109 (H) 11/27/2021   TRIG 86 11/27/2021   CHOLHDL 5 05/02/2020   Lab Results  Component  Value Date   VD25OH 38.2 11/27/2021   VD25OH 36.55 07/17/2021   VD25OH 42.46 05/02/2020   Lab Results  Component Value Date   WBC 6.9 11/27/2021   HGB 13.9 11/27/2021   HCT 40.6 11/27/2021   MCV 89 11/27/2021   PLT 287 11/27/2021   Lab Results  Component Value Date   IRON 70 07/17/2021   TIBC 303 07/17/2021   FERRITIN 124 07/17/2021   Attestation Statements:   Reviewed by clinician on day of visit: allergies, medications, problem list, medical history, surgical  history, family history, social history, and previous encounter notes.  I, Brendell Tyus, RMA, am acting as transcriptionist for [name], FNP.  I have reviewed the above documentation for accuracy and completeness, and I agree with the above. -  ***

## 2022-05-26 ENCOUNTER — Encounter: Payer: Self-pay | Admitting: Professional

## 2022-05-26 ENCOUNTER — Ambulatory Visit (INDEPENDENT_AMBULATORY_CARE_PROVIDER_SITE_OTHER): Payer: 59 | Admitting: Professional

## 2022-05-26 ENCOUNTER — Encounter (INDEPENDENT_AMBULATORY_CARE_PROVIDER_SITE_OTHER): Payer: 59 | Admitting: Psychology

## 2022-05-26 ENCOUNTER — Encounter (INDEPENDENT_AMBULATORY_CARE_PROVIDER_SITE_OTHER): Payer: Self-pay

## 2022-05-26 ENCOUNTER — Telehealth (INDEPENDENT_AMBULATORY_CARE_PROVIDER_SITE_OTHER): Payer: Self-pay | Admitting: Psychology

## 2022-05-26 DIAGNOSIS — F418 Other specified anxiety disorders: Secondary | ICD-10-CM

## 2022-05-26 DIAGNOSIS — F1021 Alcohol dependence, in remission: Secondary | ICD-10-CM | POA: Diagnosis not present

## 2022-05-26 DIAGNOSIS — F4310 Post-traumatic stress disorder, unspecified: Secondary | ICD-10-CM | POA: Insufficient documentation

## 2022-05-26 DIAGNOSIS — R69 Illness, unspecified: Secondary | ICD-10-CM | POA: Diagnosis not present

## 2022-05-26 NOTE — Telephone Encounter (Signed)
  Office: [phone]  /  Fax: [phone]  Date of Encounter: May 26, 2022  Time of Encounter: 11:31am Duration of Encounter: ~3 minute(s) Provider: Glennie Isle, PsyD  CONTENT:  [name] presented for today's appointment via [place] Visit. She shared she met with a new therapist this morning per the referral placed by this provider. Due to possible insurance concerns related to seeing two mental health providers in the same day, [facility] was receptive to rescheduling today's appointment. Notably, she disclosed she did not feel the new provider was a good fit. As such, this provider encouraged her to explore the referral options that were previously shared; she agreed. A brief risk assessment was completed. Zain denied experiencing suicidal, self-harm, and homicidal ideation, plan, or intent since the last appointment with this provider. All questions/concerns addressed.   PLAN: Tonishia is scheduled for an appointment on 05/27/2022 at 12pm via [place] Visit.
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
## 2022-05-26 NOTE — Progress Notes (Signed)
° ° ° ° ° ° ° ° ° ° ° ° ° ° °  Delmont Prosch, LCMHC °

## 2022-05-26 NOTE — Progress Notes (Signed)
Entered in error

## 2022-05-27 ENCOUNTER — Telehealth (INDEPENDENT_AMBULATORY_CARE_PROVIDER_SITE_OTHER): Payer: 59 | Admitting: Psychology

## 2022-05-27 DIAGNOSIS — R69 Illness, unspecified: Secondary | ICD-10-CM | POA: Diagnosis not present

## 2022-05-27 DIAGNOSIS — F411 Generalized anxiety disorder: Secondary | ICD-10-CM | POA: Diagnosis not present

## 2022-05-30 ENCOUNTER — Ambulatory Visit (INDEPENDENT_AMBULATORY_CARE_PROVIDER_SITE_OTHER): Payer: 59 | Admitting: Nurse Practitioner

## 2022-05-30 ENCOUNTER — Encounter (HOSPITAL_BASED_OUTPATIENT_CLINIC_OR_DEPARTMENT_OTHER): Payer: Self-pay | Admitting: Nurse Practitioner

## 2022-05-30 VITALS — BP 106/62 | HR 85 | Ht 63.0 in | Wt 213.0 lb

## 2022-05-30 DIAGNOSIS — E538 Deficiency of other specified B group vitamins: Secondary | ICD-10-CM | POA: Diagnosis not present

## 2022-05-30 DIAGNOSIS — F1011 Alcohol abuse, in remission: Secondary | ICD-10-CM | POA: Diagnosis not present

## 2022-05-30 DIAGNOSIS — R7303 Prediabetes: Secondary | ICD-10-CM | POA: Diagnosis not present

## 2022-05-30 DIAGNOSIS — K760 Fatty (change of) liver, not elsewhere classified: Secondary | ICD-10-CM

## 2022-05-30 DIAGNOSIS — R69 Illness, unspecified: Secondary | ICD-10-CM | POA: Diagnosis not present

## 2022-05-30 DIAGNOSIS — K7030 Alcoholic cirrhosis of liver without ascites: Secondary | ICD-10-CM

## 2022-05-30 DIAGNOSIS — E559 Vitamin D deficiency, unspecified: Secondary | ICD-10-CM

## 2022-05-30 DIAGNOSIS — I10 Essential (primary) hypertension: Secondary | ICD-10-CM | POA: Diagnosis not present

## 2022-05-30 NOTE — Patient Instructions (Addendum)
It was a pleasure seeing you today. I hope your time spent with [place] was pleasant and helpful. Please let us know if there is anything we can do to improve the service you receive.   You are doing an amazing job!! I am very proud of you and all of your efforts!! Keep up the great work.   Look how far you have come!  We will check your labs today and make sure everything looks ok.   Important Office Information Lab Results If labs were ordered, please note that you will see results through [place] as soon as they come available from [place].  It takes up to 5 business days for the results to be routed to me and for me to review them once all of the lab results have come through from [facility]. I will make recommendations based on your results and send these through [place] or someone from the office will call you to discuss. If your labs are abnormal, we may contact you to schedule a visit to discuss the results and make recommendations.  If you have not heard from [place] within 5 business days or you have waited longer than a week and your lab results have not come through on [place], please feel free to call the office or send a message through [place] to follow-up on these labs.   Referrals If referrals were placed today, the office where the referral was sent will contact you either by phone or through [place] to set up scheduling. Please note that it can take up to a week for the referral office to contact you. If you do not hear from them in a week, please contact the referral office directly to inquire about scheduling.   Condition Treated If your condition worsens or you begin to have new symptoms, please schedule a follow-up appointment for further evaluation. If you are not sure if an appointment is needed, you may call the office to leave a message for the nurse and someone will contact you with recommendations.  If you have an urgent or life threatening emergency, please do not call the office,  but seek emergency evaluation by calling 911 or going to the nearest emergency room for evaluation.   MyChart and Phone Calls Please do not use MyChart for urgent messages. It may take up to 3 business days for MyChart messages to be read by staff and if they are unable to handle the request, an additional 3 business days for them to be routed to me and for my response.  Messages sent to the provider through [place] do not come directly to the provider, please allow time for these messages to be routed and for me to respond.  We get a large volume of MyChart messages daily and these are responded to in the order received.   For urgent messages, please call the office at [phone] and speak with the front office staff or leave a message on the line of my assistant for guidance.  We are seeing patients from the hours of 8:00 am through 5:00 pm and calls directly to the nurse may not be answered immediately due to seeing patients, but your call will be returned as soon as possible.  Phone  messages received after 4:00 PM Monday through Thursday may not be returned until the following business day. Phone messages received after 11:00 AM on Friday may not be returned until Monday.   After Hours We share on call hours with providers from  other offices. If you have an urgent need after hours that cannot wait until the next business day, please contact the on call provider by calling the office number. A nurse will speak with you and contact the provider if needed for recommendations.  If you have an urgent or life threatening emergency after hours, please do not call the on call provider, but seek emergency evaluation by calling 911 or going to the nearest emergency room for evaluation.   Paperwork All paperwork requires a minimum of 5 days to complete and return to you or the designated personnel. Please keep this in mind when bringing in forms or sending requests for paperwork completion to the office.

## 2022-05-30 NOTE — Progress Notes (Signed)
Brandi Keeler, DNP, AGNP-c [place], Brandi Kirk [id number] [phone] Office [phone] Fax  ESTABLISHED PATIENT- Chronic Health and/or Follow-Up Visit  Blood pressure 106/62, pulse 85, height 5' 3"  (1.6 m), weight 213 lb (96.6 kg), SpO2 99 %.    Brandi Kirk is a 36 y.o. year old female presenting today for evaluation and management of the following: Follow-up (Patient presents today for follow up states she is doing well. 15 months clean of alcohol)   Prediabetes Fatty liver Brandi Kirk is been working hard on [employer].  Her BMI has dropped from 57.32-37.73 today.  She is doing a low-carb intermittent fasting diet and supplementing a protein drink for 1 meal.  She is eating 1 larger meal and 1 very light meal.  She tells me that her calorie intake is very limited and she is not routinely exercising due to the low caloric intake.  She tells me that she feels very good and is very proud of her accomplishments.  Alcohol use disorder, mild, in sustained remission She tells me that she has not had any issues with alcohol and has remained alcohol free for the last 15 months.  She tells me that she is enjoying being a mother and a wife and present with her family at this time.  She has recently been started on Seroquel 100 mg at night.  She tells me that she is sleeping much better with this.    All ROS negative with exception of what is listed above.   PHYSICAL EXAM Physical Exam Vitals and nursing note reviewed.  Constitutional:      General: She is not in acute distress.    Appearance: Normal appearance.  HENT:     Head: Normocephalic.  Eyes:     Extraocular Movements: Extraocular movements intact.     Conjunctiva/sclera: Conjunctivae normal.     Pupils: Pupils are equal, round, and reactive to light.  Neck:     Vascular: No carotid bruit.  Cardiovascular:     Rate and Rhythm: Normal rate and regular  rhythm.     Pulses: Normal pulses.     Heart sounds: Normal heart sounds. No murmur heard. Pulmonary:     Effort: Pulmonary effort is normal.     Breath sounds: Normal breath sounds. No wheezing.  Abdominal:     General: Bowel sounds are normal. There is no distension.     Palpations: Abdomen is soft.     Tenderness: There is no abdominal tenderness. There is no guarding.  Musculoskeletal:        General: Normal range of motion.     Cervical back: Normal range of motion and neck supple.     Right lower leg: No edema.     Left lower leg: No edema.  Lymphadenopathy:     Cervical: No cervical adenopathy.  Skin:    General: Skin is warm and dry.     Capillary Refill: Capillary refill takes less than 2 seconds.  Neurological:     General: No focal deficit present.     Mental Status: She is alert and oriented to person, place, and time.  Psychiatric:        Mood and Affect: Mood normal.        Behavior: Behavior normal.        Thought Content: Thought content normal.        Judgment: Judgment normal.     PLAN Problem List Items Addressed This  Visit     Prediabetes - Primary (Chronic)    We will obtain labs today.  Patient has lost a significant amount of weight.  I do suspect that her blood sugar levels will be much improved.  Patient praised for her efforts.  No changes to plan of care today.      Relevant Orders   CBC With Diff/Platelet (Completed)   Comprehensive metabolic panel (Completed)   Hemoglobin A1c (Completed)   VITAMIN D 25 Hydroxy (Vit-D Deficiency, Fractures) (Completed)   Lipid panel (Completed)   Thyroid Panel With TSH (Completed)   B12 and Folate Panel (Completed)   Iron, TIBC and Ferritin Panel (Completed)   Essential hypertension (Chronic)    Blood pressure excellent and very well controlled today.  Recommend continuation of current diet regimen.  Patient praised for her successful efforts.  Labs pending.      Cirrhosis of liver ([place])    History of  alcohol abuse with subsequent cirrhosis.  She has now been alcohol free for the last 15 months and is working diligently on weight loss.  Patient praised for her efforts and success.  Continuation of current plan is vital for overall health.  Labs pending      Alcohol use disorder, severe, in [place] remission, dependence ([place])    Patient has been 15 months without alcohol consumption.  She has been praised for her efforts.  Recommend continuation of current management.  If she does feel the need for alcohol consumption she has been encouraged to reach out for help.      Vitamin D deficiency    Repeat labs today.      Relevant Orders   CBC With Diff/Platelet (Completed)   Comprehensive metabolic panel (Completed)   Hemoglobin A1c (Completed)   VITAMIN D 25 Hydroxy (Vit-D Deficiency, Fractures) (Completed)   Lipid panel (Completed)   Thyroid Panel With TSH (Completed)   B12 and Folate Panel (Completed)   Iron, TIBC and Ferritin Panel (Completed)   Fatty liver    Chronic.  Labs pending today.  Patient has lost a significant amount of weight.  Recommend continuation with alcohol avoidance and dietary plan.      Relevant Orders   CBC With Diff/Platelet (Completed)   Comprehensive metabolic panel (Completed)   Hemoglobin A1c (Completed)   VITAMIN D 25 Hydroxy (Vit-D Deficiency, Fractures) (Completed)   Lipid panel (Completed)   Thyroid Panel With TSH (Completed)   B12 and Folate Panel (Completed)   Iron, TIBC and Ferritin Panel (Completed)   Other Visit Diagnoses     Vitamin B12 deficiency       Relevant Orders   CBC With Diff/Platelet (Completed)   Comprehensive metabolic panel (Completed)   Hemoglobin A1c (Completed)   VITAMIN D 25 Hydroxy (Vit-D Deficiency, Fractures) (Completed)   Lipid panel (Completed)   Thyroid Panel With TSH (Completed)   B12 and Folate Panel (Completed)   Iron, TIBC and Ferritin Panel (Completed)       Return in about 6 months (around 11/28/2022) for  CPE and Labs.   Brandi Keeler, DNP, AGNP-c 05/30/2022  9:29 AM

## 2022-05-31 LAB — CBC WITH DIFF/PLATELET
Basophils Absolute: 0 x10E3/uL (ref 0.0–0.2)
Basos: 1 %
EOS (ABSOLUTE): 0.2 x10E3/uL (ref 0.0–0.4)
Eos: 2 %
Hematocrit: 39.9 % (ref 34.0–46.6)
Hemoglobin: 13.5 g/dL (ref 11.1–15.9)
Immature Grans (Abs): 0 x10E3/uL (ref 0.0–0.1)
Immature Granulocytes: 0 %
Lymphocytes Absolute: 2.3 x10E3/uL (ref 0.7–3.1)
Lymphs: 26 %
MCH: 31 pg (ref 26.6–33.0)
MCHC: 33.8 g/dL (ref 31.5–35.7)
MCV: 92 fL (ref 79–97)
Monocytes Absolute: 0.6 x10E3/uL (ref 0.1–0.9)
Monocytes: 6 %
Neutrophils Absolute: 5.7 x10E3/uL (ref 1.4–7.0)
Neutrophils: 65 %
Platelets: 280 x10E3/uL (ref 150–450)
RBC: 4.35 x10E6/uL (ref 3.77–5.28)
RDW: 12 % (ref 11.7–15.4)
WBC: 8.9 x10E3/uL (ref 3.4–10.8)

## 2022-05-31 LAB — THYROID PANEL WITH TSH
Free Thyroxine Index: 1.9 (ref 1.2–4.9)
T3 Uptake Ratio: 28 % (ref 24–39)
T4, Total: 6.8 ug/dL (ref 4.5–12.0)
TSH: 1.26 u[IU]/mL (ref 0.450–4.500)

## 2022-05-31 LAB — IRON,TIBC AND FERRITIN PANEL
Ferritin: 348 ng/mL — ABNORMAL HIGH (ref 15–150)
Iron Saturation: 34 % (ref 15–55)
Iron: 85 ug/dL (ref 27–159)
Total Iron Binding Capacity: 251 ug/dL (ref 250–450)
UIBC: 166 ug/dL (ref 131–425)

## 2022-05-31 LAB — COMPREHENSIVE METABOLIC PANEL WITH GFR
ALT: 22 IU/L (ref 0–32)
AST: 20 IU/L (ref 0–40)
Albumin/Globulin Ratio: 1.8 (ref 1.2–2.2)
Albumin: 4.8 g/dL (ref 3.9–4.9)
Alkaline Phosphatase: 92 IU/L (ref 44–121)
BUN/Creatinine Ratio: 9 (ref 9–23)
BUN: 6 mg/dL (ref 6–20)
Bilirubin Total: 1 mg/dL (ref 0.0–1.2)
CO2: 21 mmol/L (ref 20–29)
Calcium: 9.2 mg/dL (ref 8.7–10.2)
Chloride: 102 mmol/L (ref 96–106)
Creatinine, Ser: 0.7 mg/dL (ref 0.57–1.00)
Globulin, Total: 2.6 g/dL (ref 1.5–4.5)
Glucose: 85 mg/dL (ref 70–99)
Potassium: 3.8 mmol/L (ref 3.5–5.2)
Sodium: 139 mmol/L (ref 134–144)
Total Protein: 7.4 g/dL (ref 6.0–8.5)
eGFR: 116 mL/min/1.73

## 2022-05-31 LAB — B12 AND FOLATE PANEL
Folate: 14.9 ng/mL (ref >3.0)
Vitamin B-12: >2000 pg/mL — ABNORMAL HIGH (ref 232–1245)

## 2022-05-31 LAB — HEMOGLOBIN A1C
Est. average glucose Bld gHb Est-mCnc: 105 mg/dL
Hgb A1c MFr Bld: 5.3 % (ref 4.8–5.6)

## 2022-05-31 LAB — LIPID PANEL
Chol/HDL Ratio: 4.7 ratio — ABNORMAL HIGH (ref 0.0–4.4)
Cholesterol, Total: 178 mg/dL (ref 100–199)
HDL: 38 mg/dL — ABNORMAL LOW (ref >39)
LDL Chol Calc (NIH): 124 mg/dL — ABNORMAL HIGH (ref 0–99)
Triglycerides: 83 mg/dL (ref 0–149)
VLDL Cholesterol Cal: 16 mg/dL (ref 5–40)

## 2022-05-31 LAB — VITAMIN D 25 HYDROXY (VIT D DEFICIENCY, FRACTURES): Vit D, 25-Hydroxy: 33 ng/mL (ref 30.0–100.0)

## 2022-06-03 ENCOUNTER — Encounter (HOSPITAL_BASED_OUTPATIENT_CLINIC_OR_DEPARTMENT_OTHER): Payer: Self-pay | Admitting: Nurse Practitioner

## 2022-06-03 ENCOUNTER — Telehealth (INDEPENDENT_AMBULATORY_CARE_PROVIDER_SITE_OTHER): Payer: 59 | Admitting: Psychology

## 2022-06-03 DIAGNOSIS — R69 Illness, unspecified: Secondary | ICD-10-CM | POA: Diagnosis not present

## 2022-06-03 DIAGNOSIS — F5089 Other specified eating disorder: Secondary | ICD-10-CM

## 2022-06-03 DIAGNOSIS — F4320 Adjustment disorder, unspecified: Secondary | ICD-10-CM | POA: Diagnosis not present

## 2022-06-03 NOTE — Progress Notes (Signed)
  Office: [phone]  /  Fax: [phone]    Date: June 03, 2022    Appointment Start Time: 2:00pm Duration: 32 minutes Provider: Glennie Isle, Psy.D. Type of Session: Individual Therapy  Location of Patient: Home (private location) Location of Provider: Provider's Home (private office) Type of Contact: Telepsychological Visit via MyChart Video Visit  Session Content: Brandi Kirk is a 36 y.o. female presenting for a follow-up appointment to address the previously established treatment goal of increasing coping skills.Today's appointment was a telepsychological visit. Brandi Kirk provided verbal consent for today's telepsychological appointment and she is aware she is responsible for securing confidentiality on her end of the session. Prior to proceeding with today's appointment, Brandi Kirk's physical location at the time of this appointment was obtained as well a phone number she could be reached at in the event of technical difficulties. Brandi Kirk and this provider participated in today's telepsychological service.   This provider conducted a brief check-in. Brandi Kirk shared about recent events. She acknowledged instances of increased hunger this past weekend as she was staying up later with her nephews and the increased dose of Seroquel. Brandi Kirk recalled feeling upset that she was engaging in emotional eating behaviors, which resulted in more emotional eating behaviors. Further explored and processed. To assist with the aforementioned, she bought alternative ice cream options. Additionally, Brandi Kirk shared about her recent appointment with her new therapist. She feels the provider is not a good fit. Further explored and processed. Brandi Kirk indicated a plan to attender a follow-up appointment, but was receptive to exploring the shared referral options by this provider and contacting [facility]. To assist with coping, Brandi Kirk was engaged in a grounding exercise (5-4-3-2-1) and her experience was  processed. Overall, Brandi Kirk was receptive to today's appointment as evidenced by openness to sharing, responsiveness to feedback, and willingness to continue engaging in learned skills.  Mental Status Examination:  Appearance: neat Behavior: appropriate to circumstances Mood: neutral, but sad when discussing initial appt with new provider Affect: mood congruent; tearful when discussing initial appt with new provider Speech: WNL Eye Contact: appropriate Psychomotor Activity: WNL Gait: unable to assess Thought Process: linear, logical, and goal directed and no evidence or endorsement of suicidal, homicidal, and self-harm ideation, plan and intent  Thought Content/Perception: no hallucinations, delusions, bizarre thinking or behavior endorsed or observed Orientation: AAOx4 Memory/Concentration: memory, attention, language, and fund of knowledge intact  Insight: good Judgment: fair  Interventions:  Conducted a brief chart review Provided empathic reflections and validation Provided positive reinforcement Employed supportive psychotherapy interventions to facilitate reduced distress and to improve coping skills with identified stressors Engaged pt in a grounding exercise  DSM-5 Diagnosis(es):  F50.89 Other Specified Feeding or Eating Disorder, Emotional Eating Behaviors and  F43.20 Adjustment Disorder, Unspecified   Treatment Goal & Progress: During the initial appointment with this provider, the following treatment goal was established: increase coping skills. Brandi Kirk has demonstrated progress in her goal as evidenced by increased awareness of hunger patterns, increased awareness of triggers for emotional eating behaviors, and reduction in emotional eating behaviors . Brandi Kirk also continues to demonstrate willingness to engage in learned skill(s).  Plan: Due to recent stressors, an additional appointment was recommended. The next appointment is scheduled for 06/16/2022 at 8:30am, which will be  via MyChart Video Visit. The next session will focus on working towards the established treatment goal and termination. Brandi Kirk will meet with her primary therapist on 06/09/2022 and psychiatric provider on 06/10/2022.

## 2022-06-04 ENCOUNTER — Ambulatory Visit (INDEPENDENT_AMBULATORY_CARE_PROVIDER_SITE_OTHER): Payer: 59 | Admitting: Nurse Practitioner

## 2022-06-04 ENCOUNTER — Encounter (INDEPENDENT_AMBULATORY_CARE_PROVIDER_SITE_OTHER): Payer: Self-pay | Admitting: Nurse Practitioner

## 2022-06-04 VITALS — BP 112/79 | HR 95 | Temp 98.8°F | Ht 63.0 in | Wt 210.0 lb

## 2022-06-04 DIAGNOSIS — E669 Obesity, unspecified: Secondary | ICD-10-CM | POA: Diagnosis not present

## 2022-06-04 DIAGNOSIS — Z6837 Body mass index (BMI) 37.0-37.9, adult: Secondary | ICD-10-CM

## 2022-06-04 DIAGNOSIS — R638 Other symptoms and signs concerning food and fluid intake: Secondary | ICD-10-CM

## 2022-06-09 ENCOUNTER — Encounter: Payer: Self-pay | Admitting: Professional

## 2022-06-09 ENCOUNTER — Ambulatory Visit (INDEPENDENT_AMBULATORY_CARE_PROVIDER_SITE_OTHER): Payer: 59 | Admitting: Professional

## 2022-06-09 DIAGNOSIS — F411 Generalized anxiety disorder: Secondary | ICD-10-CM | POA: Insufficient documentation

## 2022-06-09 DIAGNOSIS — F4310 Post-traumatic stress disorder, unspecified: Secondary | ICD-10-CM

## 2022-06-09 DIAGNOSIS — F330 Major depressive disorder, recurrent, mild: Secondary | ICD-10-CM | POA: Diagnosis not present

## 2022-06-09 DIAGNOSIS — R69 Illness, unspecified: Secondary | ICD-10-CM | POA: Diagnosis not present

## 2022-06-09 DIAGNOSIS — F1021 Alcohol dependence, in remission: Secondary | ICD-10-CM

## 2022-06-09 MED ORDER — QSYMIA 11.25-69 MG PO CP24: 11.2500 | ORAL_CAPSULE | Freq: Every day | ORAL | 0 refills | Status: DC

## 2022-06-09 NOTE — Progress Notes (Signed)
° ° ° ° ° ° ° ° ° ° ° ° ° ° °  Brandi Kirk, LCMHC °

## 2022-06-09 NOTE — Progress Notes (Signed)
[facility] Counselor/Therapist Progress Note  Patient ID: Brandi Kirk, MRN: [id number],    Date: 06/09/2022  Time Spent: 61 minutes 11am-1201pm   Treatment Type: Individual Therapy  Risk Assessment: Danger to Self:  No Self-injurious Behavior: No Danger to Others: No  Subjective: This session was held via video teletherapy. The patient consented to video teletherapy and was located at her home during this session. She is aware it is the responsibility of the patient to secure confidentiality on her end of the session. The provider was in a private home office for the duration of this session.    The patient arrived on time for her webex appointment.   Issues addressed: 1-treatment planning -pt and Clinician completed development of pt's treatment plan. -pt fully participated and agrees with her treatment plan.  Treatment Plan Problems Addressed  Anxiety, Eating Disorders And Obesity, Intimate Relationship Conflicts, Sleep Disturbance  Goals 1. Develop coping strategies (e.g., feeling identification, problem-solving, assertiveness) to address emotional issues that could lead to relapse of the eating disorder. 2. Develop healthy cognitive patterns and beliefs about self that lead to positive identity and prevent a relapse of the eating disorder. 3. Develop the necessary skills for effective, open communication, mutually satisfying sexual intimacy, and enjoyable time for companionship within the relationship. Objective Identify problems and strengths in the relationship, including one's own role in each. Target Date: 2023-06-09 Frequency: Weekly  Progress: 0 Modality: individual  Related Interventions Assess current, ongoing problems in the relationship, including possible abuse/neglect, substance use, communication, conflict resolution, as well as home environment (if domestic violence is present, plan for safety and avoid early use of conjoint sessions; see the  Physical Abuse chapter in The Couples Psychotherapy Treatment Planner by Maudie Mercury, and Jongsma). Assess strengths in the relationship that could be enhanced during the therapy to facilitate the accomplishment of therapeutic goals. Objective Acknowledge the connection between substance abuse and the conflicts present within the relationship. Target Date: 2023-06-09 Frequency: Weekly  Progress: 0 Modality: individual  Related Interventions Explore with the couple the role of substance abuse in precipitating conflict and/or abuse within the relationship. Objective Make a commitment to change specific behaviors that have been identified by self or the partner. Target Date: 2023-06-09 Frequency: Weekly  Progress: 0 Modality: individual  Related Interventions Process the list of positive and problematic features of each partner and the relationship; ask couple to agree to work on changes he/she needs to make to improve the relationship, generating a list of targeted changes (or assign "How Can We Meet Each Other's Needs and Desires?" in the Adult Psychotherapy Homework Planner by Bryn Gulling). Objective Increase the frequency of the direct expression of honest, respectful, and positive feelings and thoughts within the relationship. Target Date: 2023-06-09 Frequency: Weekly  Progress: 0 Modality: individual  Related Interventions Assist the couple in identifying conflicts that can be addressed using communication, conflict-resolution, and/or problem-solving skills (see "Behavioral Marital Therapy" by Gearlean Alf). Use behavioral techniques (education, modeling, role-playing, corrective feedback, and positive reinforcement) to teach communication skills including assertive communication, offering positive feedback, active listening, making positive requests of others for behavior change, and giving negative feedback in an honest and respectful manner. Objective Learn and implement  problem-solving and conflict resolution skills. Target Date: 2023-06-09 Frequency: Weekly  Progress: 0 Modality: individual  Related Interventions Review how newly learned communication skills can be applied to conflict resolution through calm, respectful, effective dialogue; role-play application of this skill to a present conflict situation. Use behavioral techniques (education, modeling,  role-playing, corrective feedback, and positive reinforcement) to teach the couple problem-solving and conflict resolution skills including defining the problem constructively and specifically, brainstorming options, evaluating options, compromise, choosing options and implementing a plan, evaluating the results. Objective Learn and implement cognitive therapy techniques to replace unrealistic, maladaptive thoughts, feelings, and actions with those facilitative of the relationship. Target Date: 2023-06-09 Frequency: Weekly  Progress: 0 Modality: individual  Related Interventions Use cognitive therapy techniques to restructure the clients' biased cognitions (e.g., mind-reading, blaming), modify maladaptive emotional responses (e.g., rage) and inappropriate behaviors (e.g., verbal aggression) within the relationship (see Enhanced Cognitive Behavioral Therapy for Couples by Tommie Ard and Baucom) Identify the couple's irrational beliefs and unrealistic expectations regarding relationships and then assist them in adopting more realistic beliefs and expectations of each other and of the relationship (or assign "Journal and Replace Self-Defeating Thoughts" in the Adult Psychotherapy Homework Planner by Bryn Gulling). Objective Accept partner's existing characteristics that are unlikely to change but do not jeopardize the relationship. Target Date: 2023-06-09 Frequency: Weekly  Progress: 0 Modality: individual  Related Interventions Help the couple build tolerance of each other's differences by seeing the positive side of such  differences to balance their awareness of drawbacks (see Integrative Couple Therapy by Beverly Milch). Objective Understand the origin of each other's negative emotions and reactions and develop more constructive interactions that fill needs. Target Date: 2023-06-09 Frequency: Weekly  Progress: 0 Modality: individual  Related Interventions Encourage the clients to recognize, reframe, and express these insecurities toward resolving negative emotional and behavioral reactions. Assist the clients in developing more constructive interactions that satisfy attachment needs such as increased intimacy and expressions of love (or assign "How Can We Meet Each Other's Needs and Desires?" in the Adult Psychotherapy Homework Planner by Bryn Gulling). Objective Increase flexibility of expectations, willingness to compromise, and acceptance of irreconcilable differences. Target Date: 2023-06-09 Frequency: Weekly  Progress: 0 Modality: individual  Related Interventions Teach both partners the key concepts of flexibility, compromise, sacrifice of wants, and acceptance of differences toward increased understanding, empathy, intimacy, and compassion for each other (see Integrative Couple Therapy by Beverly Milch). 4. End abrupt awakening in terror and return to peaceful, restful sleep pattern. 5. Enhance ability to effectively cope with the full variety of life's worries and anxieties. 6. Feel refreshed and energetic during wakeful hours. 7. Increase awareness of own role in the relationship conflicts. 8. Learn and implement coping skills that result in a reduction of anxiety and worry, and improved daily functioning. 9. Learn to identify escalating behaviors that lead to abuse. 10. Re-establish trust in relationship and learn how to forgive spouse. 11. Reduce overall frequency, intensity, and duration of the anxiety so that daily functioning is not impaired. Objective Describe situations,  thoughts, feelings, and actions associated with anxieties and worries, their impact on functioning, and attempts to resolve them. Target Date: 2023-06-09 Frequency: Weekly  Progress: 0 Modality: individual  Related Interventions Focus on developing a level of trust with the client; provide support and empathy to encourage the client to feel safe in expressing his/her GAD symptoms. Ask the client to describe his/her past experiences of anxiety and their impact on functioning; assess the focus, excessiveness, and uncontrollability of the worry and the type, frequency, intensity, and duration of his/her anxiety symptoms (consider using a structured interview such as The Anxiety Disorders Interview Schedule-Adult Version). Objective Verbalize an understanding of the role that cognitive biases play in excessive irrational worry and persistent anxiety symptoms. Target Date: 2023-06-09 Frequency: Weekly  Progress: 0  Modality: individual  Related Interventions Discuss examples demonstrating that unrealistic worry typically overestimates the probability of threats and underestimates or overlooks the client's ability to manage realistic demands (or assign "Past Successful Anxiety Coping" in the Adult Psychotherapy Homework Planner by [facility]). Assist the client in analyzing his/her worries by examining potential biases such as the probability of the negative expectation occurring, the real consequences of it occurring, his/her ability to control the outcome, the worst possible outcome, and his/her ability to accept it (see "Analyze the Probability of a Feared Event" in the Adult Psychotherapy Homework Planner by Bryn Gulling; Cognitive Therapy of Anxiety Disorders by Alison Stalling). Objective Undergo gradual repeated imaginal exposure to the feared negative consequences predicted by worries and develop alternative reality-based predictions. Target Date: 2023-06-09 Frequency: Weekly  Progress: 0 Modality:  individual  Related Interventions Select initial exposures that have a high likelihood of being a success experience for the client; develop a plan for managing the negative effect engendered by exposure; mentally rehearse the procedure. Direct and assist the client in constructing a hierarchy of two to three spheres of worry for use in exposure (e.g., worry about harm to others, financial difficulties, relationship problems). Ask the client to vividly imagine worst-case consequences of worries, holding them in mind until anxiety associated with them weakens (up to 30 minutes); generate reality-based alternatives to that worst case and process them (see Mastery of Your Anxiety and Worry: Therapist Guide by Hans Eden). Objective Learn to accept limitations in life and commit to tolerating, rather than avoiding, unpleasant emotions while accomplishing meaningful goals. Target Date: 2023-06-09 Frequency: Weekly  Progress: 0 Modality: individual  Related Interventions Use techniques from Acceptance and Commitment Therapy to help client accept uncomfortable realities such as lack of complete control, imperfections, and uncertainty and tolerate unpleasant emotions and thoughts in order to accomplish value-consistent goals. Objective Learn and implement calming skills to reduce overall anxiety and manage anxiety symptoms. Target Date: 2023-06-09 Frequency: Weekly  Progress: 0 Modality: individual  Related Interventions Teach the client calming/relaxation skills (e.g., applied relaxation, progressive muscle relaxation, cue controlled relaxation; mindful breathing; biofeedback) and how to discriminate better between relaxation and tension; teach the client how to apply these skills to his/her daily life (e.g., New Directions in Progressive Muscle Relaxation by Casper Harrison, and Hazlett-Stevens; Treating Generalized Anxiety Disorder by Rygh and Amparo Bristol). Assign the client homework  each session in which he/she practices relaxation exercises daily, gradually applying them progressively from non-anxiety-provoking to anxiety-provoking situations; review and reinforce success while providing corrective feedback toward improvement. 12. Resolve the core conflict that is the source of anxiety. 13. Restore normal eating patterns, healthy weight maintenance, and a realistic appraisal of body size. Objective Identify and develop a list of high-risk situations for unhealthy eating or weight loss practices. Target Date: 2023-06-09 Frequency: Weekly  Progress: 0 Modality: individual  Related Interventions Assess the nature of any external cues (e.g., persons, objects, and situations) and internal cues (thoughts, images, and impulses) that precipitate the client's uncontrolled eating and/or compensatory weight management behaviors. Direct and assist the client in construction of a hierarchy of high-risk internal and external triggers for uncontrolled eating and/or compensatory weight management behaviors. Objective Learn and implement skills for managing urges to engage in unhealthy eating or weight loss practices. Target Date: 2023-06-09 Frequency: Weekly  Progress: 0 Modality: individual  Related Interventions Teach the client tailored skills to manage high-risk situations including distraction, positive self-talk, problem-solving, conflict resolution (e.g., empathy, active listening, "I messages," respectful communication, assertiveness  without aggression, compromise), or other social/ communication skills; use modeling, role-playing, and behavior rehearsal to work through several current situations. Objective Implement relapse prevention strategies for managing possible future anxiety symptoms. Target Date: 2023-06-09 Frequency: Weekly  Progress: 0 Modality: individual  Related Interventions Instruct the client to routinely use strategies learned in therapy (e.g., continued exposure  to previous external or internal cues that arise) to prevent relapse. Develop a "maintenance plan" with the client that describes how the client plans to identify challenges, use knowledge and skills learned in therapy to manage them, and maintain positive changes gained in therapy. 14. Restore restful sleep pattern. Objective Describe the history and details of sleep pattern. Target Date: 2023-06-09 Frequency: Weekly  Progress: 0 Modality: individual  Related Interventions Assess the client's sleep history including sleep pattern, bedtime routine, activities associated with the bed, activity level while awake, nutritional habits including stimulant use, napping practice, actual sleep time, rhythm of time for being awake versus sleeping, and so on. Assign the client to keep a journal of sleep patterns, stressors, thoughts, feelings, and activities associated with going to bed, and other relevant client-specific factors possibly associated with sleep problems; process the material for details of the sleep-wake cycle. Objective Verbalize depressive or anxious feelings and share possible causes. Target Date: 2023-06-09 Frequency: Weekly  Progress: 0 Modality: individual  Related Interventions Assess the role of depression or anxiety as the cause of the client's sleep disturbance (see the Unipolar Depression or Anxiety chapters in this Planner). Objective Verbalize an understanding of normal sleep, sleep disturbances, and their treatment. Target Date: 2023-06-09 Frequency: Weekly  Progress: 0 Modality: individual  Related Interventions Provide the client with basic sleep education (e.g., normal length of sleep, normal variations of sleep, normal time to fall asleep, and normal midnight awakening; recommend The Insomnia Workbook: A Comprehensive Guide to Getting the Sleep You Need by Silberman); help the client understand the exact nature of his/her "abnormal" sleeping pattern. Provide the client  with a rationale for the therapy, explaining the role of cognitive, emotional, physiological, and behavioral contributions to good and poor sleep. Objective Learn and implement stimulus control strategies to establish a consistent sleep-wake rhythm. Target Date: 2023-06-09 Frequency: Weekly  Progress: 0 Modality: individual  Related Interventions Discuss with the client the rationale for stimulus control strategies to establish a consistent sleep-wake cycle (see Behavioral Treatments for Sleep Disorders by Kittie Plater, and Orma Render). Teach the client stimulus control techniques (e.g., lie down to sleep only when sleepy; do not use the bed for activities like watching television, reading, listening to music, but only for sleep or sexual activity; get out of bed if sleep doesn't arrive soon after retiring; lie back down when sleepy; set alarm to the same wake-up time every morning regardless of sleep time or quality; do not nap during the day); assign consistent implementation. Instruct the client to move activities associated with arousal and activation from the bedtime ritual to other times during the day (e.g., reading stimulating content, reviewing day's events, planning for next day, watching disturbing television). Monitor the client's sleep patterns and compliance with stimulus control instructions; problem-solve obstacles and reinforce successful, consistent implementation. Objective Learn and implement skills for managing stresses contributing to the sleep problem. Target Date: 2023-06-09 Frequency: Weekly  Progress: 0 Modality: individual  Related Interventions Use cognitive behavioral skills training techniques (e.g., instruction, covert modeling [i.e., imagining the successful use of the strategies], role-play, practice, and generalization training) to teach the client tailored skills (e.g., calming and coping skills, conflict-resolution,  problem-solving) for managing stressors related to the  sleep disturbance (e.g., interpersonal conflicts that carry over and cause nighttime wakefulness); routinely review, reinforce successes, problem-solve obstacles toward effective everyday use (see Insomnia: A Clinical Guide to Assessment and Treatment by Carolynn Serve and Espie; Treating Sleep Disorders by Madilyn Fireman and Lichstein). Objective Discuss experiences of emotional traumas that may disturb sleep. Target Date: 2023-06-09 Frequency: Weekly  Progress: 0 Modality: individual  Related Interventions Explore recent traumatic events that may be interfering with the client's sleep. Objective Discuss fears regarding relinquishing control. Target Date: 2023-06-09 Frequency: Weekly  Progress: 0 Modality: individual  Related Interventions Probe the client's fears related to letting go of control. 15. Restore restful sleep with reduction of sleepwalking incidents. 16. Terminate anxiety-producing dreams that cause awakening. 17. Terminate overeating and implement lifestyle changes that lead to weight loss and improved health. 18. Terminate the pattern of binge eating with a return to eating normal amounts of nutritious foods.  Diagnosis:Major depressive disorder, recurrent episode, mild (HCC)  Generalized anxiety disorder  PTSD (post-traumatic stress disorder)  Alcohol use disorder, severe, in early remission, dependence ([place])  Plan:  -meet weekly per pt need and request. -next appointment is Wednesday, June 18, 2022 at [facility], [facility]

## 2022-06-09 NOTE — Progress Notes (Unsigned)
Chief Complaint:   OBESITY Brandi Kirk is here to discuss her progress with her obesity treatment plan along with follow-up of her obesity related diagnoses. Brandi Kirk is on keeping a food journal and adhering to recommended goals of 1200 calories and 90 grams protein and states she is following her eating plan approximately 85% of the time. Brandi Kirk states she is doing elliptical 20 minutes 3-4 times per week.  Today's visit was #: 9 Starting weight: 262 lbs Starting date: 11/27/2021 Today's weight: 210 lbs Today's date: 06/04/2022 Total lbs lost to date: 52 lbs Total lbs lost since last in-office visit: 10  Interim History: Brandi Kirk has overall done well with weight loss. She is following Intermitted fasting 11am-9pm. Breakfast : 2 eggs and Bloom shake. Lunch: protein and veggies and protein shake ( as a meal replacement). Dinner: protein, veggies. Sometimes snacks on ice cream. She has been moving and exercising more. Her highest weight: 288 lbs. Goal weight: 160 lbs.  Subjective:   1. Abnormal cravings-  medication induced [name] is taking Qsymia dose 3 and is doing well. Denies any chest pain,shortness of breath or palpitations.  Assessment/Plan:   1. Abnormal cravings-  medication induced We will refill Qsymia dose 3. For 1 month with 0 refills. Side effects discussed. [name] has an IUD. Knows not to get pregnant while taking as Topamax can cause birth defects.   -Refill Phentermine-Topiramate (QSYMIA) 11.25-69 MG CP24; Take 11.25-69 tablets by mouth daily.  Dispense: 30 capsule; Refill: 0  2. Obesity with current BMI of 37.2 Brandi Kirk is currently in the action stage of change. As such, her goal is to continue with weight loss efforts. She has agreed to keeping a food journal and adhering to recommended goals of 1200 calories and 90 grams of protein.   Exercise goals: As is.  Behavioral modification strategies: increasing lean protein intake, increasing vegetables, and increasing water  intake.  Brandi Kirk has agreed to follow-up with our clinic in 4 weeks. She was informed of the importance of frequent follow-up visits to maximize her success with intensive lifestyle modifications for her multiple health conditions.   Objective:   Blood pressure 112/79, pulse 95, temperature 98.8 F (37.1 C), height 5' 3"  (1.6 m), weight 210 lb (95.3 kg), SpO2 98 %. Body mass index is 37.2 kg/m.  General: Cooperative, alert, well developed, in no acute distress. HEENT: Conjunctivae and lids unremarkable. Cardiovascular: Regular rhythm.  Lungs: Normal work of breathing. Neurologic: No focal deficits.   Lab Results  Component Value Date   CREATININE 0.70 05/30/2022   BUN 6 05/30/2022   NA 139 05/30/2022   K 3.8 05/30/2022   CL 102 05/30/2022   CO2 21 05/30/2022   Lab Results  Component Value Date   ALT 22 05/30/2022   AST 20 05/30/2022   ALKPHOS 92 05/30/2022   BILITOT 1.0 05/30/2022   Lab Results  Component Value Date   HGBA1C 5.3 05/30/2022   HGBA1C 5.3 11/27/2021   HGBA1C 5.3 04/20/2018   HGBA1C 5.2 11/25/2016   Lab Results  Component Value Date   INSULIN 9.2 11/27/2021   Lab Results  Component Value Date   TSH 1.260 05/30/2022   Lab Results  Component Value Date   CHOL 178 05/30/2022   HDL 38 (L) 05/30/2022   LDLCALC 124 (H) 05/30/2022   TRIG 83 05/30/2022   CHOLHDL 4.7 (H) 05/30/2022   Lab Results  Component Value Date   VD25OH 33.0 05/30/2022   VD25OH 38.2 11/27/2021  VD25OH 36.55 07/17/2021   Lab Results  Component Value Date   WBC 8.9 05/30/2022   HGB 13.5 05/30/2022   HCT 39.9 05/30/2022   MCV 92 05/30/2022   PLT 280 05/30/2022   Lab Results  Component Value Date   IRON 85 05/30/2022   TIBC 251 05/30/2022   FERRITIN 348 (H) 05/30/2022   Attestation Statements:   Reviewed by clinician on day of visit: allergies, medications, problem list, medical history, surgical history, family history, social history, and previous encounter  notes.  I, Brendell Tyus, RMA, am acting as transcriptionist for [name], FNP.  I have reviewed the above documentation for accuracy and completeness, and I agree with the above. [name], FNP

## 2022-06-10 DIAGNOSIS — R69 Illness, unspecified: Secondary | ICD-10-CM | POA: Diagnosis not present

## 2022-06-10 DIAGNOSIS — F411 Generalized anxiety disorder: Secondary | ICD-10-CM | POA: Diagnosis not present

## 2022-06-16 ENCOUNTER — Telehealth (INDEPENDENT_AMBULATORY_CARE_PROVIDER_SITE_OTHER): Payer: 59 | Admitting: Psychology

## 2022-06-16 DIAGNOSIS — F5089 Other specified eating disorder: Secondary | ICD-10-CM | POA: Diagnosis not present

## 2022-06-16 DIAGNOSIS — R69 Illness, unspecified: Secondary | ICD-10-CM | POA: Diagnosis not present

## 2022-06-16 DIAGNOSIS — F4320 Adjustment disorder, unspecified: Secondary | ICD-10-CM | POA: Diagnosis not present

## 2022-06-16 NOTE — Progress Notes (Signed)
  Office: [phone]  /  Fax: [phone]    Date: June 16, 2022    Appointment Start Time: 8:30am Duration: 34 minutes Provider: Glennie Isle, Psy.D. Type of Session: Individual Therapy  Location of Patient: Home (private location) Location of Provider: Provider's Home (private office) Type of Contact: Telepsychological Visit via MyChart Video Visit  Session Content: Brandi Kirk is a 36 y.o. female presenting for a follow-up appointment to address the previously established treatment goal of increasing coping skills.Today's appointment was a telepsychological visit. Brandi Kirk provided verbal consent for today's telepsychological appointment and she is aware she is responsible for securing confidentiality on her end of the session. Prior to proceeding with today's appointment, Brandi Kirk's physical location at the time of this appointment was obtained as well a phone number she could be reached at in the event of technical difficulties. Brandi Kirk and this provider participated in today's telepsychological service.   This provider conducted a brief check-in. Brandi Kirk stated, "It's been really stressful" due to family conflict. Further explored and processed. She added, "I've been doing a lot of binge eating." She disclosed two instances of binge/emotional eating behaviors. Notably, Brandi Kirk indicated a plan to continue with her primary therapist, adding they will be meeting weekly. She reported she continues to abstain from alcohol. Brandi Kirk of session focused on processing recent binge/emotional eating episodes and reviewing learned skills. She agreed to focus on protein intake starting today; have snacks congruent to her structured meal plan prepared and available; and implement previously discussed coping strategies, including activities for self-care. Overall, Brandi Kirk was receptive to today's appointment as evidenced by openness to sharing, responsiveness to feedback, and willingness to continue engaging in  learned skills.  Mental Status Examination:  Appearance: neat Behavior: appropriate to circumstances Mood: sad Affect: mood congruent; tearful  Speech: WNL Eye Contact: appropriate Psychomotor Activity: WNL Gait: unable to assess Thought Process: linear, logical, and goal directed and denies suicidal, homicidal, and self-harm ideation, plan and intent  Thought Content/Perception: no hallucinations, delusions, bizarre thinking or behavior endorsed or observed Orientation: AAOx4 Memory/Concentration: memory, attention, language, and fund of knowledge intact  Insight: fair Judgment: fair  Interventions:  Conducted a brief chart review Provided empathic reflections and validation Employed supportive psychotherapy interventions to facilitate reduced distress and to improve coping skills with identified stressors Reviewed learned skills Provided positive reinforcement   DSM-5 Diagnosis(es):  F50.89 Other Specified Feeding or Eating Disorder, Emotional Eating Behaviors and  F43.20 Adjustment Disorder, Unspecified   Treatment Goal & Progress: During the initial appointment with this provider, the following treatment goal was established: increase coping skills. Brandi Kirk demonstrated progress in her goal as evidenced by increased awareness of hunger patterns, increased awareness of triggers for emotional eating behaviors, reduction in emotional eating behaviors , and reduction in binge eating behaviors. Brandi Kirk also continues to demonstrate willingness to engage in learned skill(s).  Plan: As previously planned, today was Brandi Kirk's last appointment with this provider. Brandi Kirk will continue with her primary therapist. Her next appointment is June 18, 2022. She indicated she will address eating-related concerns further with her primary therapist in addition to ongoing stressors. She acknowledged understanding that she may request a follow-up appointment with this provider in the future as long as  she is still established with the clinic. No further follow-up planned by this provider.

## 2022-06-18 ENCOUNTER — Encounter: Payer: Self-pay | Admitting: Professional

## 2022-06-18 ENCOUNTER — Ambulatory Visit (INDEPENDENT_AMBULATORY_CARE_PROVIDER_SITE_OTHER): Payer: 59 | Admitting: Professional

## 2022-06-18 DIAGNOSIS — F411 Generalized anxiety disorder: Secondary | ICD-10-CM | POA: Diagnosis not present

## 2022-06-18 DIAGNOSIS — F4310 Post-traumatic stress disorder, unspecified: Secondary | ICD-10-CM | POA: Diagnosis not present

## 2022-06-18 DIAGNOSIS — F330 Major depressive disorder, recurrent, mild: Secondary | ICD-10-CM | POA: Diagnosis not present

## 2022-06-18 DIAGNOSIS — R69 Illness, unspecified: Secondary | ICD-10-CM | POA: Diagnosis not present
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
## 2022-06-19 ENCOUNTER — Encounter: Payer: Self-pay | Admitting: Nurse Practitioner

## 2022-06-19 ENCOUNTER — Other Ambulatory Visit (INDEPENDENT_AMBULATORY_CARE_PROVIDER_SITE_OTHER): Payer: Self-pay | Admitting: Nurse Practitioner

## 2022-06-19 DIAGNOSIS — R638 Other symptoms and signs concerning food and fluid intake: Secondary | ICD-10-CM

## 2022-06-23 ENCOUNTER — Other Ambulatory Visit: Payer: Self-pay | Admitting: Nurse Practitioner

## 2022-06-23 ENCOUNTER — Other Ambulatory Visit: Payer: Self-pay | Admitting: Family Medicine

## 2022-06-23 ENCOUNTER — Encounter: Payer: Self-pay | Admitting: Professional

## 2022-06-23 ENCOUNTER — Ambulatory Visit (INDEPENDENT_AMBULATORY_CARE_PROVIDER_SITE_OTHER): Payer: 59 | Admitting: Professional

## 2022-06-23 DIAGNOSIS — R638 Other symptoms and signs concerning food and fluid intake: Secondary | ICD-10-CM

## 2022-06-23 DIAGNOSIS — F330 Major depressive disorder, recurrent, mild: Secondary | ICD-10-CM | POA: Diagnosis not present

## 2022-06-23 DIAGNOSIS — F411 Generalized anxiety disorder: Secondary | ICD-10-CM | POA: Diagnosis not present

## 2022-06-23 DIAGNOSIS — F4310 Post-traumatic stress disorder, unspecified: Secondary | ICD-10-CM | POA: Diagnosis not present

## 2022-06-23 DIAGNOSIS — R69 Illness, unspecified: Secondary | ICD-10-CM | POA: Diagnosis not present

## 2022-06-23 MED ORDER — QSYMIA 11.25-69 MG PO CP24: 11.2500 | ORAL_CAPSULE | Freq: Every day | ORAL | 0 refills | Status: DC
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
## 2022-06-24 DIAGNOSIS — R69 Illness, unspecified: Secondary | ICD-10-CM | POA: Diagnosis not present

## 2022-06-24 DIAGNOSIS — F411 Generalized anxiety disorder: Secondary | ICD-10-CM | POA: Diagnosis not present

## 2022-06-28 NOTE — Assessment & Plan Note (Signed)
Blood pressure excellent and very well controlled today.  Recommend continuation of current diet regimen.  Patient praised for her successful efforts.  Labs pending.

## 2022-06-28 NOTE — Assessment & Plan Note (Signed)
History of alcohol abuse with subsequent cirrhosis.  She has now been alcohol free for the last 15 months and is working diligently on weight loss.  Patient praised for her efforts and success.  Continuation of current plan is vital for overall health.  Labs pending

## 2022-06-28 NOTE — Assessment & Plan Note (Signed)
Chronic.  Labs pending today.  Patient has lost a significant amount of weight.  Recommend continuation with alcohol avoidance and dietary plan.

## 2022-06-28 NOTE — Assessment & Plan Note (Signed)
Patient has been 15 months without alcohol consumption.  She has been praised for her efforts.  Recommend continuation of current management.  If she does feel the need for alcohol consumption she has been encouraged to reach out for help.

## 2022-06-28 NOTE — Assessment & Plan Note (Signed)
Repeat labs today

## 2022-06-28 NOTE — Assessment & Plan Note (Signed)
We will obtain labs today.  Patient has lost a significant amount of weight.  I do suspect that her blood sugar levels will be much improved.  Patient praised for her efforts.  No changes to plan of care today.

## 2022-06-30 ENCOUNTER — Encounter: Payer: Self-pay | Admitting: Professional

## 2022-06-30 ENCOUNTER — Ambulatory Visit (INDEPENDENT_AMBULATORY_CARE_PROVIDER_SITE_OTHER): Payer: 59 | Admitting: Professional

## 2022-06-30 DIAGNOSIS — F330 Major depressive disorder, recurrent, mild: Secondary | ICD-10-CM | POA: Diagnosis not present

## 2022-06-30 DIAGNOSIS — F411 Generalized anxiety disorder: Secondary | ICD-10-CM | POA: Diagnosis not present

## 2022-06-30 DIAGNOSIS — F4310 Post-traumatic stress disorder, unspecified: Secondary | ICD-10-CM | POA: Diagnosis not present

## 2022-06-30 DIAGNOSIS — R69 Illness, unspecified: Secondary | ICD-10-CM | POA: Diagnosis not present
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
## 2022-07-02 ENCOUNTER — Encounter: Payer: Self-pay | Admitting: Nurse Practitioner

## 2022-07-02 ENCOUNTER — Ambulatory Visit (INDEPENDENT_AMBULATORY_CARE_PROVIDER_SITE_OTHER): Payer: 59 | Admitting: Nurse Practitioner

## 2022-07-02 VITALS — BP 118/81 | HR 79 | Temp 98.5°F | Ht 63.0 in | Wt 203.0 lb

## 2022-07-02 DIAGNOSIS — F330 Major depressive disorder, recurrent, mild: Secondary | ICD-10-CM | POA: Diagnosis not present

## 2022-07-02 DIAGNOSIS — Z6836 Body mass index (BMI) 36.0-36.9, adult: Secondary | ICD-10-CM

## 2022-07-02 DIAGNOSIS — E669 Obesity, unspecified: Secondary | ICD-10-CM

## 2022-07-03 NOTE — Progress Notes (Signed)
Chief Complaint:   OBESITY Brandi Kirk is here to discuss her progress with her obesity treatment plan along with follow-up of her obesity related diagnoses. Brandi Kirk is on keeping a food journal and adhering to recommended goals of 1600 calories and 75 grams of protein and states she is following her eating plan approximately 50% of the time. [name] states she is exercising 0 minutes 0 times per week.  Today's visit was #: 10 Starting weight: 262 lbs Starting date: 11/27/2021 Today's weight: 203 lbs Today's date: 07/02/2022 Total lbs lost to date: 59 lbs Total lbs lost since last in-office visit: 7  Interim History: Brandi Kirk has done well with weight loss since her last visit. She has been stress eating more ice cream and chips. Struggles with hunger. She has had some medication changes due to cost. Her Prozac was increased back to the original dose 2 days ago. Feels her huger/cravings and stress eating started with medication changes. She is hopeful that this will get better with increase in her Prozac.  Subjective:   1. Major depressive disorder, recurrent episode Brandi Kirk is seeing a therapist on a regular bases. Denies any suicidal thoughts.  Feels that her depression has gotten worse with medication changes.  Hopefully she will start feeling better with increase in her Prozac dose.    Assessment/Plan:   1. Major depressive disorder, recurrent episode [facility] will continue to follow up with therapist and PCP.  To let someone know if her symptoms worsen or persist.    2. Obesity with current BMI of 36.1 Brandi Kirk is currently in the action stage of change. As such, her goal is to continue with weight loss efforts. She has agreed to keeping a food journal and adhering to recommended goals of 1200 calories and 75+ grams of protein.   Exercise goals: All adults should avoid inactivity. Some physical activity is better than none, and adults who participate in any amount of physical activity gain  some health benefits.  Behavioral modification strategies: increasing water intake, no skipping meals, and planning for success.  Brandi Kirk has agreed to follow-up with our clinic in 4 weeks. She was informed of the importance of frequent follow-up visits to maximize her success with intensive lifestyle modifications for her multiple health conditions.   Objective:   Blood pressure 118/81, pulse 79, temperature 98.5 F (36.9 C), temperature source Oral, height 5' 3"  (1.6 m), weight 203 lb (92.1 kg), SpO2 98 %. Body mass index is 35.96 kg/m.  General: Cooperative, alert, well developed, in no acute distress. HEENT: Conjunctivae and lids unremarkable. Cardiovascular: Regular rhythm.  Lungs: Normal work of breathing. Neurologic: No focal deficits.   Lab Results  Component Value Date   CREATININE 0.70 05/30/2022   BUN 6 05/30/2022   NA 139 05/30/2022   K 3.8 05/30/2022   CL 102 05/30/2022   CO2 21 05/30/2022   Lab Results  Component Value Date   ALT 22 05/30/2022   AST 20 05/30/2022   ALKPHOS 92 05/30/2022   BILITOT 1.0 05/30/2022   Lab Results  Component Value Date   HGBA1C 5.3 05/30/2022   HGBA1C 5.3 11/27/2021   HGBA1C 5.3 04/20/2018   HGBA1C 5.2 11/25/2016   Lab Results  Component Value Date   INSULIN 9.2 11/27/2021   Lab Results  Component Value Date   TSH 1.260 05/30/2022   Lab Results  Component Value Date   CHOL 178 05/30/2022   HDL 38 (L) 05/30/2022   LDLCALC 124 (H) 05/30/2022  TRIG 83 05/30/2022   CHOLHDL 4.7 (H) 05/30/2022   Lab Results  Component Value Date   VD25OH 33.0 05/30/2022   VD25OH 38.2 11/27/2021   VD25OH 36.55 07/17/2021   Lab Results  Component Value Date   WBC 8.9 05/30/2022   HGB 13.5 05/30/2022   HCT 39.9 05/30/2022   MCV 92 05/30/2022   PLT 280 05/30/2022   Lab Results  Component Value Date   IRON 85 05/30/2022   TIBC 251 05/30/2022   FERRITIN 348 (H) 05/30/2022   Attestation Statements:   Reviewed by clinician on  day of visit: allergies, medications, problem list, medical history, surgical history, family history, social history, and previous encounter notes.  Time spent on visit including pre-visit chart review and post-visit care and charting was 30 minutes.   I, Brendell Tyus, RMA, am acting as transcriptionist for [name], FNP.  I have reviewed the above documentation for accuracy and completeness, and I agree with the above. [name], FNP

## 2022-07-08 DIAGNOSIS — R69 Illness, unspecified: Secondary | ICD-10-CM | POA: Diagnosis not present

## 2022-07-08 DIAGNOSIS — F411 Generalized anxiety disorder: Secondary | ICD-10-CM | POA: Diagnosis not present

## 2022-07-14 ENCOUNTER — Encounter: Payer: Self-pay | Admitting: Professional

## 2022-07-14 ENCOUNTER — Ambulatory Visit (INDEPENDENT_AMBULATORY_CARE_PROVIDER_SITE_OTHER): Payer: 59 | Admitting: Professional

## 2022-07-14 DIAGNOSIS — F411 Generalized anxiety disorder: Secondary | ICD-10-CM

## 2022-07-14 DIAGNOSIS — F4310 Post-traumatic stress disorder, unspecified: Secondary | ICD-10-CM

## 2022-07-14 DIAGNOSIS — F330 Major depressive disorder, recurrent, mild: Secondary | ICD-10-CM

## 2022-07-14 DIAGNOSIS — R69 Illness, unspecified: Secondary | ICD-10-CM | POA: Diagnosis not present

## 2022-07-14 NOTE — Progress Notes (Signed)
[facility] Counselor/Therapist Progress Note  Patient ID: Brandi Kirk, MRN: [id number],    Date: 07/14/2022  Time Spent: 52 minutes 1102-1154am   Treatment Type: Individual Therapy  Risk Assessment: Danger to Self:  No Self-injurious Behavior: No Danger to Others: No  Subjective: This session was held via video teletherapy. The patient consented to video teletherapy and was located at her home during this session. She is aware it is the responsibility of the patient to secure confidentiality on her end of the session. The provider was in a private home office for the duration of this session.    The patient arrived time for her webex appointment.   Issues addressed: 1-son -relationship is improving -she attended her son's therapy session and she found it helpful -therapist addressed pt's behaviors -Charlotte Crumb was "mortified" that she had seen him being intimate with his girlfriend -he is on punishment for 3 weeks that could become 4 weeks if he does not keep his grades A/B's and his behavior positive -Johnny used his mom's alcoholism against her and told her he will need to earn her respect   -pt corrected her son 2-medication for sleep terrors -has been prescribed Prazosin  -has been having very active nightmares 3-marital -husband does not support pt's parenting of their son -how to be on the same page regarding parenting   -use "we" -spouse Jenny Reichmann has admitted that the way Chrys Racer was raised was not helpful 4-insight -"I had reached the boiling point and I wanted to have my say" -pt wished she would have just walked away -pt admits to feeling hurt by so many things that happened during the argument with her son  Treatment Plan Problems Addressed  Anxiety, Eating Disorders And Obesity, Intimate Relationship Conflicts, Sleep Disturbance  Goals 1. Develop coping strategies (e.g., feeling identification, problem-solving, assertiveness) to address emotional  issues that could lead to relapse of the eating disorder. 2. Develop healthy cognitive patterns and beliefs about self that lead to positive identity and prevent a relapse of the eating disorder. 3. Develop the necessary skills for effective, open communication, mutually satisfying sexual intimacy, and enjoyable time for companionship within the relationship. Objective Identify problems and strengths in the relationship, including one's own role in each. Target Date: 2023-06-09 Frequency: Weekly  Progress: 0 Modality: individual  Related Interventions Assess current, ongoing problems in the relationship, including possible abuse/neglect, substance use, communication, conflict resolution, as well as home environment (if domestic violence is present, plan for safety and avoid early use of conjoint sessions; see the Physical Abuse chapter in The Couples Psychotherapy Treatment Planner by Maudie Mercury, and Jongsma). Assess strengths in the relationship that could be enhanced during the therapy to facilitate the accomplishment of therapeutic goals. Objective Acknowledge the connection between substance abuse and the conflicts present within the relationship. Target Date: 2023-06-09 Frequency: Weekly  Progress: 0 Modality: individual  Related Interventions Explore with the couple the role of substance abuse in precipitating conflict and/or abuse within the relationship. Objective Make a commitment to change specific behaviors that have been identified by self or the partner. Target Date: 2023-06-09 Frequency: Weekly  Progress: 0 Modality: individual  Related Interventions Process the list of positive and problematic features of each partner and the relationship; ask couple to agree to work on changes he/she needs to make to improve the relationship, generating a list of targeted changes (or assign "How Can We Meet Each Other's Needs and Desires?" in the Adult Psychotherapy Homework Planner by  Bryn Gulling). Objective Increase the  frequency of the direct expression of honest, respectful, and positive feelings and thoughts within the relationship. Target Date: 2023-06-09 Frequency: Weekly  Progress: 0 Modality: individual  Related Interventions Assist the couple in identifying conflicts that can be addressed using communication, conflict-resolution, and/or problem-solving skills (see "Behavioral Marital Therapy" by Gearlean Alf). Use behavioral techniques (education, modeling, role-playing, corrective feedback, and positive reinforcement) to teach communication skills including assertive communication, offering positive feedback, active listening, making positive requests of others for behavior change, and giving negative feedback in an honest and respectful manner. Objective Learn and implement problem-solving and conflict resolution skills. Target Date: 2023-06-09 Frequency: Weekly  Progress: 0 Modality: individual  Related Interventions Review how newly learned communication skills can be applied to conflict resolution through calm, respectful, effective dialogue; role-play application of this skill to a present conflict situation. Use behavioral techniques (education, modeling, role-playing, corrective feedback, and positive reinforcement) to teach the couple problem-solving and conflict resolution skills including defining the problem constructively and specifically, brainstorming options, evaluating options, compromise, choosing options and implementing a plan, evaluating the results. Objective Learn and implement cognitive therapy techniques to replace unrealistic, maladaptive thoughts, feelings, and actions with those facilitative of the relationship. Target Date: 2023-06-09 Frequency: Weekly  Progress: 0 Modality: individual  Related Interventions Use cognitive therapy techniques to restructure the clients' biased cognitions (e.g., mind-reading, blaming), modify  maladaptive emotional responses (e.g., rage) and inappropriate behaviors (e.g., verbal aggression) within the relationship (see Enhanced Cognitive Behavioral Therapy for Couples by Tommie Ard and Baucom) Identify the couple's irrational beliefs and unrealistic expectations regarding relationships and then assist them in adopting more realistic beliefs and expectations of each other and of the relationship (or assign "Journal and Replace Self-Defeating Thoughts" in the Adult Psychotherapy Homework Planner by Bryn Gulling). Objective Accept partner's existing characteristics that are unlikely to change but do not jeopardize the relationship. Target Date: 2023-06-09 Frequency: Weekly  Progress: 0 Modality: individual  Related Interventions Help the couple build tolerance of each other's differences by seeing the positive side of such differences to balance their awareness of drawbacks (see Integrative Couple Therapy by Beverly Milch). Objective Understand the origin of each other's negative emotions and reactions and develop more constructive interactions that fill needs. Target Date: 2023-06-09 Frequency: Weekly  Progress: 0 Modality: individual  Related Interventions Encourage the clients to recognize, reframe, and express these insecurities toward resolving negative emotional and behavioral reactions. Assist the clients in developing more constructive interactions that satisfy attachment needs such as increased intimacy and expressions of love (or assign "How Can We Meet Each Other's Needs and Desires?" in the Adult Psychotherapy Homework Planner by Bryn Gulling). Objective Increase flexibility of expectations, willingness to compromise, and acceptance of irreconcilable differences. Target Date: 2023-06-09 Frequency: Weekly  Progress: 0 Modality: individual  Related Interventions Teach both partners the key concepts of flexibility, compromise, sacrifice of wants, and acceptance of differences  toward increased understanding, empathy, intimacy, and compassion for each other (see Integrative Couple Therapy by Beverly Milch). 4. End abrupt awakening in terror and return to peaceful, restful sleep pattern. 5. Enhance ability to effectively cope with the full variety of life's worries and anxieties. 6. Feel refreshed and energetic during wakeful hours. 7. Increase awareness of own role in the relationship conflicts. 8. Learn and implement coping skills that result in a reduction of anxiety and worry, and improved daily functioning. 9. Learn to identify escalating behaviors that lead to abuse. 10. Re-establish trust in relationship and learn how to forgive spouse. 11. Reduce overall frequency,  intensity, and duration of the anxiety so that daily functioning is not impaired. Objective Describe situations, thoughts, feelings, and actions associated with anxieties and worries, their impact on functioning, and attempts to resolve them. Target Date: 2023-06-09 Frequency: Weekly  Progress: 0 Modality: individual  Related Interventions Focus on developing a level of trust with the client; provide support and empathy to encourage the client to feel safe in expressing his/her GAD symptoms. Ask the client to describe his/her past experiences of anxiety and their impact on functioning; assess the focus, excessiveness, and uncontrollability of the worry and the type, frequency, intensity, and duration of his/her anxiety symptoms (consider using a structured interview such as The Anxiety Disorders Interview Schedule-Adult Version). Objective Verbalize an understanding of the role that cognitive biases play in excessive irrational worry and persistent anxiety symptoms. Target Date: 2023-06-09 Frequency: Weekly  Progress: 0 Modality: individual  Related Interventions Discuss examples demonstrating that unrealistic worry typically overestimates the probability of threats and underestimates or  overlooks the client's ability to manage realistic demands (or assign "Past Successful Anxiety Coping" in the Adult Psychotherapy Homework Planner by [facility]). Assist the client in analyzing his/her worries by examining potential biases such as the probability of the negative expectation occurring, the real consequences of it occurring, his/her ability to control the outcome, the worst possible outcome, and his/her ability to accept it (see "Analyze the Probability of a Feared Event" in the Adult Psychotherapy Homework Planner by Bryn Gulling; Cognitive Therapy of Anxiety Disorders by Alison Stalling). Objective Undergo gradual repeated imaginal exposure to the feared negative consequences predicted by worries and develop alternative reality-based predictions. Target Date: 2023-06-09 Frequency: Weekly  Progress: 0 Modality: individual  Related Interventions Select initial exposures that have a high likelihood of being a success experience for the client; develop a plan for managing the negative effect engendered by exposure; mentally rehearse the procedure. Direct and assist the client in constructing a hierarchy of two to three spheres of worry for use in exposure (e.g., worry about harm to others, financial difficulties, relationship problems). Ask the client to vividly imagine worst-case consequences of worries, holding them in mind until anxiety associated with them weakens (up to 30 minutes); generate reality-based alternatives to that worst case and process them (see Mastery of Your Anxiety and Worry: Therapist Guide by Hans Eden). Objective Learn to accept limitations in life and commit to tolerating, rather than avoiding, unpleasant emotions while accomplishing meaningful goals. Target Date: 2023-06-09 Frequency: Weekly  Progress: 0 Modality: individual  Related Interventions Use techniques from Acceptance and Commitment Therapy to help client accept uncomfortable realities such as  lack of complete control, imperfections, and uncertainty and tolerate unpleasant emotions and thoughts in order to accomplish value-consistent goals. Objective Learn and implement calming skills to reduce overall anxiety and manage anxiety symptoms. Target Date: 2023-06-09 Frequency: Weekly  Progress: 0 Modality: individual  Related Interventions Teach the client calming/relaxation skills (e.g., applied relaxation, progressive muscle relaxation, cue controlled relaxation; mindful breathing; biofeedback) and how to discriminate better between relaxation and tension; teach the client how to apply these skills to his/her daily life (e.g., New Directions in Progressive Muscle Relaxation by Casper Harrison, and Hazlett-Stevens; Treating Generalized Anxiety Disorder by Rygh and Amparo Bristol). Assign the client homework each session in which he/she practices relaxation exercises daily, gradually applying them progressively from non-anxiety-provoking to anxiety-provoking situations; review and reinforce success while providing corrective feedback toward improvement. 12. Resolve the core conflict that is the source of anxiety. 13. Restore normal eating patterns,  healthy weight maintenance, and a realistic appraisal of body size. Objective Identify and develop a list of high-risk situations for unhealthy eating or weight loss practices. Target Date: 2023-06-09 Frequency: Weekly  Progress: 0 Modality: individual  Related Interventions Assess the nature of any external cues (e.g., persons, objects, and situations) and internal cues (thoughts, images, and impulses) that precipitate the client's uncontrolled eating and/or compensatory weight management behaviors. Direct and assist the client in construction of a hierarchy of high-risk internal and external triggers for uncontrolled eating and/or compensatory weight management behaviors. Objective Learn and implement skills for managing urges to engage in  unhealthy eating or weight loss practices. Target Date: 2023-06-09 Frequency: Weekly  Progress: 0 Modality: individual  Related Interventions Teach the client tailored skills to manage high-risk situations including distraction, positive self-talk, problem-solving, conflict resolution (e.g., empathy, active listening, "I messages," respectful communication, assertiveness without aggression, compromise), or other social/ communication skills; use modeling, role-playing, and behavior rehearsal to work through several current situations. Objective Implement relapse prevention strategies for managing possible future anxiety symptoms. Target Date: 2023-06-09 Frequency: Weekly  Progress: 0 Modality: individual  Related Interventions Instruct the client to routinely use strategies learned in therapy (e.g., continued exposure to previous external or internal cues that arise) to prevent relapse. Develop a "maintenance plan" with the client that describes how the client plans to identify challenges, use knowledge and skills learned in therapy to manage them, and maintain positive changes gained in therapy. 14. Restore restful sleep pattern. Objective Describe the history and details of sleep pattern. Target Date: 2023-06-09 Frequency: Weekly  Progress: 0 Modality: individual  Related Interventions Assess the client's sleep history including sleep pattern, bedtime routine, activities associated with the bed, activity level while awake, nutritional habits including stimulant use, napping practice, actual sleep time, rhythm of time for being awake versus sleeping, and so on. Assign the client to keep a journal of sleep patterns, stressors, thoughts, feelings, and activities associated with going to bed, and other relevant client-specific factors possibly associated with sleep problems; process the material for details of the sleep-wake cycle. Objective Verbalize depressive or anxious feelings and share  possible causes. Target Date: 2023-06-09 Frequency: Weekly  Progress: 0 Modality: individual  Related Interventions Assess the role of depression or anxiety as the cause of the client's sleep disturbance (see the Unipolar Depression or Anxiety chapters in this Planner). Objective Verbalize an understanding of normal sleep, sleep disturbances, and their treatment. Target Date: 2023-06-09 Frequency: Weekly  Progress: 0 Modality: individual  Related Interventions Provide the client with basic sleep education (e.g., normal length of sleep, normal variations of sleep, normal time to fall asleep, and normal midnight awakening; recommend The Insomnia Workbook: A Comprehensive Guide to Getting the Sleep You Need by Silberman); help the client understand the exact nature of his/her "abnormal" sleeping pattern. Provide the client with a rationale for the therapy, explaining the role of cognitive, emotional, physiological, and behavioral contributions to good and poor sleep. Objective Learn and implement stimulus control strategies to establish a consistent sleep-wake rhythm. Target Date: 2023-06-09 Frequency: Weekly  Progress: 0 Modality: individual  Related Interventions Discuss with the client the rationale for stimulus control strategies to establish a consistent sleep-wake cycle (see Behavioral Treatments for Sleep Disorders by Kittie Plater, and Orma Render). Teach the client stimulus control techniques (e.g., lie down to sleep only when sleepy; do not use the bed for activities like watching television, reading, listening to music, but only for sleep or sexual activity; get out of bed if sleep  doesn't arrive soon after retiring; lie back down when sleepy; set alarm to the same wake-up time every morning regardless of sleep time or quality; do not nap during the day); assign consistent implementation. Instruct the client to move activities associated with arousal and activation from the bedtime ritual to  other times during the day (e.g., reading stimulating content, reviewing day's events, planning for next day, watching disturbing television). Monitor the client's sleep patterns and compliance with stimulus control instructions; problem-solve obstacles and reinforce successful, consistent implementation. Objective Learn and implement skills for managing stresses contributing to the sleep problem. Target Date: 2023-06-09 Frequency: Weekly  Progress: 0 Modality: individual  Related Interventions Use cognitive behavioral skills training techniques (e.g., instruction, covert modeling [i.e., imagining the successful use of the strategies], role-play, practice, and generalization training) to teach the client tailored skills (e.g., calming and coping skills, conflict-resolution, problem-solving) for managing stressors related to the sleep disturbance (e.g., interpersonal conflicts that carry over and cause nighttime wakefulness); routinely review, reinforce successes, problem-solve obstacles toward effective everyday use (see Insomnia: A Clinical Guide to Assessment and Treatment by Carolynn Serve and Espie; Treating Sleep Disorders by Madilyn Fireman and Lichstein). Objective Discuss experiences of emotional traumas that may disturb sleep. Target Date: 2023-06-09 Frequency: Weekly  Progress: 0 Modality: individual  Related Interventions Explore recent traumatic events that may be interfering with the client's sleep. Objective Discuss fears regarding relinquishing control. Target Date: 2023-06-09 Frequency: Weekly  Progress: 0 Modality: individual  Related Interventions Probe the client's fears related to letting go of control. 15. Restore restful sleep with reduction of sleepwalking incidents. 16. Terminate anxiety-producing dreams that cause awakening. 17. Terminate overeating and implement lifestyle changes that lead to weight loss and improved health. 18. Terminate the pattern of binge eating with a  return to eating normal amounts of nutritious foods.  Diagnosis:Major depressive disorder, recurrent episode, mild (HCC)  Generalized anxiety disorder  PTSD (post-traumatic stress disorder)  Plan:  -modeling healthy behaviors for son -next appointment is Monday, July 21, 2022 at 11am

## 2022-07-21 ENCOUNTER — Encounter: Payer: Self-pay | Admitting: Professional

## 2022-07-21 ENCOUNTER — Ambulatory Visit (INDEPENDENT_AMBULATORY_CARE_PROVIDER_SITE_OTHER): Payer: 59 | Admitting: Professional

## 2022-07-21 DIAGNOSIS — F411 Generalized anxiety disorder: Secondary | ICD-10-CM

## 2022-07-21 DIAGNOSIS — F330 Major depressive disorder, recurrent, mild: Secondary | ICD-10-CM | POA: Diagnosis not present

## 2022-07-21 DIAGNOSIS — F4310 Post-traumatic stress disorder, unspecified: Secondary | ICD-10-CM

## 2022-07-21 DIAGNOSIS — R69 Illness, unspecified: Secondary | ICD-10-CM | POA: Diagnosis not present
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
## 2022-07-22 DIAGNOSIS — F411 Generalized anxiety disorder: Secondary | ICD-10-CM | POA: Diagnosis not present

## 2022-07-22 DIAGNOSIS — R69 Illness, unspecified: Secondary | ICD-10-CM | POA: Diagnosis not present

## 2022-07-26 ENCOUNTER — Encounter: Payer: Self-pay | Admitting: Nurse Practitioner

## 2022-07-28 ENCOUNTER — Ambulatory Visit: Payer: 59 | Admitting: Professional

## 2022-07-30 ENCOUNTER — Ambulatory Visit: Payer: 59 | Admitting: Nurse Practitioner

## 2022-08-04 ENCOUNTER — Ambulatory Visit: Payer: 59 | Admitting: Nurse Practitioner

## 2022-08-04 ENCOUNTER — Ambulatory Visit: Payer: 59 | Admitting: Professional

## 2022-08-08 DIAGNOSIS — F411 Generalized anxiety disorder: Secondary | ICD-10-CM | POA: Diagnosis not present

## 2022-08-08 DIAGNOSIS — R69 Illness, unspecified: Secondary | ICD-10-CM | POA: Diagnosis not present

## 2022-08-11 ENCOUNTER — Encounter: Payer: Self-pay | Admitting: Professional

## 2022-08-11 ENCOUNTER — Ambulatory Visit (INDEPENDENT_AMBULATORY_CARE_PROVIDER_SITE_OTHER): Payer: 59 | Admitting: Professional

## 2022-08-11 DIAGNOSIS — F411 Generalized anxiety disorder: Secondary | ICD-10-CM

## 2022-08-11 DIAGNOSIS — F330 Major depressive disorder, recurrent, mild: Secondary | ICD-10-CM | POA: Diagnosis not present

## 2022-08-11 DIAGNOSIS — F4310 Post-traumatic stress disorder, unspecified: Secondary | ICD-10-CM

## 2022-08-11 DIAGNOSIS — R69 Illness, unspecified: Secondary | ICD-10-CM | POA: Diagnosis not present
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
## 2022-08-15 ENCOUNTER — Other Ambulatory Visit: Payer: Self-pay | Admitting: Obstetrics and Gynecology

## 2022-08-15 ENCOUNTER — Other Ambulatory Visit: Payer: Self-pay

## 2022-08-15 DIAGNOSIS — N644 Mastodynia: Secondary | ICD-10-CM

## 2022-08-15 DIAGNOSIS — Z124 Encounter for screening for malignant neoplasm of cervix: Secondary | ICD-10-CM | POA: Diagnosis not present

## 2022-08-15 DIAGNOSIS — N760 Acute vaginitis: Secondary | ICD-10-CM | POA: Diagnosis not present

## 2022-08-15 DIAGNOSIS — N898 Other specified noninflammatory disorders of vagina: Secondary | ICD-10-CM | POA: Diagnosis not present

## 2022-08-15 NOTE — Telephone Encounter (Deleted)
A user error has taken place: {[id number]}.

## 2022-08-18 ENCOUNTER — Ambulatory Visit (INDEPENDENT_AMBULATORY_CARE_PROVIDER_SITE_OTHER): Payer: 59 | Admitting: Nurse Practitioner

## 2022-08-18 ENCOUNTER — Encounter: Payer: Self-pay | Admitting: Nurse Practitioner

## 2022-08-18 ENCOUNTER — Ambulatory Visit: Payer: 59 | Admitting: Professional

## 2022-08-18 VITALS — BP 114/85 | HR 88 | Temp 98.4°F | Ht 63.0 in | Wt 200.0 lb

## 2022-08-18 DIAGNOSIS — R638 Other symptoms and signs concerning food and fluid intake: Secondary | ICD-10-CM | POA: Diagnosis not present

## 2022-08-18 DIAGNOSIS — E65 Localized adiposity: Secondary | ICD-10-CM | POA: Diagnosis not present

## 2022-08-18 DIAGNOSIS — Z6835 Body mass index (BMI) 35.0-35.9, adult: Secondary | ICD-10-CM

## 2022-08-18 DIAGNOSIS — R69 Illness, unspecified: Secondary | ICD-10-CM | POA: Diagnosis not present

## 2022-08-18 DIAGNOSIS — F411 Generalized anxiety disorder: Secondary | ICD-10-CM | POA: Diagnosis not present

## 2022-08-18 DIAGNOSIS — E669 Obesity, unspecified: Secondary | ICD-10-CM | POA: Diagnosis not present

## 2022-08-18 MED ORDER — QSYMIA 11.25-69 MG PO CP24: 11.2500 | ORAL_CAPSULE | Freq: Every day | ORAL | 0 refills | Status: DC

## 2022-08-25 ENCOUNTER — Encounter: Payer: Self-pay | Admitting: Professional

## 2022-08-25 ENCOUNTER — Ambulatory Visit (INDEPENDENT_AMBULATORY_CARE_PROVIDER_SITE_OTHER): Payer: 59 | Admitting: Professional

## 2022-08-25 DIAGNOSIS — F4310 Post-traumatic stress disorder, unspecified: Secondary | ICD-10-CM

## 2022-08-25 DIAGNOSIS — F411 Generalized anxiety disorder: Secondary | ICD-10-CM | POA: Diagnosis not present

## 2022-08-25 DIAGNOSIS — R69 Illness, unspecified: Secondary | ICD-10-CM | POA: Diagnosis not present

## 2022-08-25 DIAGNOSIS — F330 Major depressive disorder, recurrent, mild: Secondary | ICD-10-CM | POA: Diagnosis not present
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
## 2022-09-01 ENCOUNTER — Ambulatory Visit: Payer: 59 | Admitting: Professional

## 2022-09-03 DIAGNOSIS — F411 Generalized anxiety disorder: Secondary | ICD-10-CM | POA: Diagnosis not present

## 2022-09-03 DIAGNOSIS — R69 Illness, unspecified: Secondary | ICD-10-CM | POA: Diagnosis not present

## 2022-09-08 ENCOUNTER — Ambulatory Visit: Payer: 59 | Admitting: Professional

## 2022-09-15 ENCOUNTER — Encounter: Payer: Self-pay | Admitting: Nurse Practitioner

## 2022-09-15 ENCOUNTER — Ambulatory Visit: Payer: 59 | Admitting: Professional

## 2022-09-15 ENCOUNTER — Ambulatory Visit (INDEPENDENT_AMBULATORY_CARE_PROVIDER_SITE_OTHER): Payer: 59 | Admitting: Nurse Practitioner

## 2022-09-15 VITALS — BP 110/77 | HR 75 | Temp 98.5°F | Ht 63.0 in | Wt 196.0 lb

## 2022-09-15 DIAGNOSIS — Z6834 Body mass index (BMI) 34.0-34.9, adult: Secondary | ICD-10-CM

## 2022-09-15 DIAGNOSIS — R7989 Other specified abnormal findings of blood chemistry: Secondary | ICD-10-CM | POA: Diagnosis not present

## 2022-09-15 DIAGNOSIS — Z6841 Body Mass Index (BMI) 40.0 and over, adult: Secondary | ICD-10-CM | POA: Diagnosis not present

## 2022-09-15 DIAGNOSIS — R638 Other symptoms and signs concerning food and fluid intake: Secondary | ICD-10-CM

## 2022-09-15 DIAGNOSIS — E7849 Other hyperlipidemia: Secondary | ICD-10-CM

## 2022-09-15 DIAGNOSIS — Z148 Genetic carrier of other disease: Secondary | ICD-10-CM | POA: Insufficient documentation

## 2022-09-15 DIAGNOSIS — E559 Vitamin D deficiency, unspecified: Secondary | ICD-10-CM

## 2022-09-15 DIAGNOSIS — R748 Abnormal levels of other serum enzymes: Secondary | ICD-10-CM | POA: Diagnosis not present

## 2022-09-15 DIAGNOSIS — E669 Obesity, unspecified: Secondary | ICD-10-CM

## 2022-09-15 MED ORDER — QSYMIA 15-92 MG PO CP24: ORAL_CAPSULE | ORAL | 0 refills | Status: DC

## 2022-09-16 LAB — LIPID PANEL WITH LDL/HDL RATIO
Cholesterol, Total: 164 mg/dL (ref 100–199)
HDL: 48 mg/dL (ref >39)
LDL Chol Calc (NIH): 105 mg/dL — ABNORMAL HIGH (ref 0–99)
LDL/HDL Ratio: 2.2 ratio (ref 0.0–3.2)
Triglycerides: 52 mg/dL (ref 0–149)
VLDL Cholesterol Cal: 11 mg/dL (ref 5–40)

## 2022-09-16 LAB — VITAMIN D 25 HYDROXY (VIT D DEFICIENCY, FRACTURES): Vit D, 25-Hydroxy: 30.6 ng/mL (ref 30.0–100.0)

## 2022-09-16 LAB — COMPREHENSIVE METABOLIC PANEL
ALT: 23 IU/L (ref 0–32)
AST: 16 IU/L (ref 0–40)
Albumin/Globulin Ratio: 1.8 (ref 1.2–2.2)
Albumin: 4.3 g/dL (ref 3.9–4.9)
Alkaline Phosphatase: 78 IU/L (ref 44–121)
BUN/Creatinine Ratio: 13 (ref 9–23)
BUN: 9 mg/dL (ref 6–20)
Bilirubin Total: 0.5 mg/dL (ref 0.0–1.2)
CO2: 21 mmol/L (ref 20–29)
Calcium: 9.3 mg/dL (ref 8.7–10.2)
Chloride: 104 mmol/L (ref 96–106)
Creatinine, Ser: 0.67 mg/dL (ref 0.57–1.00)
Globulin, Total: 2.4 g/dL (ref 1.5–4.5)
Glucose: 94 mg/dL (ref 70–99)
Potassium: 4.1 mmol/L (ref 3.5–5.2)
Sodium: 140 mmol/L (ref 134–144)
Total Protein: 6.7 g/dL (ref 6.0–8.5)
eGFR: 118 mL/min/{1.73_m2} (ref >59)

## 2022-09-16 LAB — FERRITIN: Ferritin: 282 ng/mL — ABNORMAL HIGH (ref 15–150)

## 2022-09-16 LAB — VITAMIN B12: Vitamin B-12: 1055 pg/mL (ref 232–1245)

## 2022-09-23 NOTE — Progress Notes (Unsigned)
Chief Complaint:   OBESITY Brandi Kirk is here to discuss her progress with her obesity treatment plan along with follow-up of her obesity related diagnoses. [name] is on keeping a food journal and adhering to recommended goals of 1200 calories and 75+ grams of protein and states she is following her eating plan approximately 60% of the time. [name] states she is exercising 0 minutes 0 times per week.  Today's visit was #: 11 Starting weight: 262 lbs Starting date: 11/27/2021 Today's weight: 196 lbs Today's date: 09/15/2022 Total lbs lost to date: 66 lbs Total lbs lost since last in-office visit: 4  Interim History: Brandi Kirk has done well overall with weight loss. Taking Qsymia dose 3. Denies any side effects.Denies any chest pain,shortness of breath or palpitations. Continues to struggle with hunger and cravings. 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  Did IC last week. Will send to me via Mychart to review or at next visit.  Subjective:   1. Other hyperlipidemia Not on medications. Family history: mother and maternal grandfather.  2. Abnormal cravings-  medication induced Daiya is taking Qsymia dose 3. Struggles with hunger and cravings.  3. Elevated ferritin Currently not taking any iron.  4. Vitamin D deficiency Brandi Kirk is taking Vit D 2,000 IU daily. Denies any side effects.  5. Elevated vitamin B12 level Not taking over the counter B12. Taking MV.  Assessment/Plan:   1. Other hyperlipidemia We will obtain labs today.  - Lipid Panel With LDL/HDL Ratio - Comprehensive metabolic panel  2. Abnormal cravings-  medication induced We will obtain labs today.  - Comprehensive metabolic panel  3. Elevated ferritin We will obtain labs today.  - Comprehensive metabolic panel - Ferritin  4. Vitamin D deficiency We will obtain labs today.  - Comprehensive metabolic panel - VITAMIN D 25 Hydroxy (Vit-D Deficiency, Fractures)  5. Elevated vitamin B12 level We will obtain labs  today.  - Comprehensive metabolic panel - Vitamin E56  6. Obesity with current BMI of 34.8 We will obtain labs today. We will refill Qsymia 15-92 mg daily for 1 month with 0 refills. Consider Zebound based upon coverage next visit.  -Refill Phentermine-Topiramate (QSYMIA) 15-92 MG CP24; Take one tablet po daily  Dispense: 30 capsule; Refill: 0  - Comprehensive metabolic panel  Brandi Kirk is currently in the action stage of change. As such, her goal is to continue with weight loss efforts. She has agreed to keeping a food journal and adhering to recommended goals of 1200 calories and 75+ grams of protein.   Exercise goals: All adults should avoid inactivity. Some physical activity is better than none, and adults who participate in any amount of physical activity gain some health benefits.  Behavioral modification strategies: increasing lean protein intake, increasing water intake, and holiday eating strategies .  [name] has agreed to follow-up with our clinic in 4 weeks. She was informed of the importance of frequent follow-up visits to maximize her success with intensive lifestyle modifications for her multiple health conditions.   Brandi Kirk was informed we would discuss her lab results at her next visit unless there is a critical issue that needs to be addressed sooner. Brandi Kirk agreed to keep her next visit at the agreed upon time to discuss these results.  Objective:   Blood pressure 110/77, pulse 75, temperature 98.5 F (36.9 C), temperature source Oral, height 5' 3"  (1.6 m), weight 196 lb (88.9 kg), SpO2 100 %. Body mass index is 34.72 kg/m.  General: Cooperative, alert, well developed,  in no acute distress. HEENT: Conjunctivae and lids unremarkable. Cardiovascular: Regular rhythm.  Lungs: Normal work of breathing. Neurologic: No focal deficits.   Lab Results  Component Value Date   CREATININE 0.67 09/15/2022   BUN 9 09/15/2022   NA 140 09/15/2022   K 4.1 09/15/2022   CL 104  09/15/2022   CO2 21 09/15/2022   Lab Results  Component Value Date   ALT 23 09/15/2022   AST 16 09/15/2022   ALKPHOS 78 09/15/2022   BILITOT 0.5 09/15/2022   Lab Results  Component Value Date   HGBA1C 5.3 05/30/2022   HGBA1C 5.3 11/27/2021   HGBA1C 5.3 04/20/2018   HGBA1C 5.2 11/25/2016   Lab Results  Component Value Date   INSULIN 9.2 11/27/2021   Lab Results  Component Value Date   TSH 1.260 05/30/2022   Lab Results  Component Value Date   CHOL 164 09/15/2022   HDL 48 09/15/2022   LDLCALC 105 (H) 09/15/2022   TRIG 52 09/15/2022   CHOLHDL 4.7 (H) 05/30/2022   Lab Results  Component Value Date   VD25OH 30.6 09/15/2022   VD25OH 33.0 05/30/2022   VD25OH 38.2 11/27/2021   Lab Results  Component Value Date   WBC 8.9 05/30/2022   HGB 13.5 05/30/2022   HCT 39.9 05/30/2022   MCV 92 05/30/2022   PLT 280 05/30/2022   Lab Results  Component Value Date   IRON 85 05/30/2022   TIBC 251 05/30/2022   FERRITIN 282 (H) 09/15/2022   Attestation Statements:   Reviewed by clinician on day of visit: allergies, medications, problem list, medical history, surgical history, family history, social history, and previous encounter notes.  I, Brendell Tyus, RMA, am acting as transcriptionist for [name], FNP.  I have reviewed the above documentation for accuracy and completeness, and I agree with the above. -  ***

## 2022-09-30 ENCOUNTER — Telehealth (INDEPENDENT_AMBULATORY_CARE_PROVIDER_SITE_OTHER): Payer: Self-pay | Admitting: Nurse Practitioner

## 2022-10-06 DIAGNOSIS — F411 Generalized anxiety disorder: Secondary | ICD-10-CM | POA: Diagnosis not present

## 2022-10-06 DIAGNOSIS — R69 Illness, unspecified: Secondary | ICD-10-CM | POA: Diagnosis not present

## 2022-10-13 ENCOUNTER — Encounter: Payer: Self-pay | Admitting: Nurse Practitioner

## 2022-10-13 ENCOUNTER — Ambulatory Visit (INDEPENDENT_AMBULATORY_CARE_PROVIDER_SITE_OTHER): Payer: 59 | Admitting: Nurse Practitioner

## 2022-10-13 VITALS — BP 107/72 | HR 86 | Temp 98.0°F | Ht 63.0 in | Wt 188.0 lb

## 2022-10-13 DIAGNOSIS — R0602 Shortness of breath: Secondary | ICD-10-CM | POA: Diagnosis not present

## 2022-10-13 DIAGNOSIS — Z6833 Body mass index (BMI) 33.0-33.9, adult: Secondary | ICD-10-CM | POA: Diagnosis not present

## 2022-10-13 DIAGNOSIS — E669 Obesity, unspecified: Secondary | ICD-10-CM

## 2022-10-13 DIAGNOSIS — R638 Other symptoms and signs concerning food and fluid intake: Secondary | ICD-10-CM | POA: Diagnosis not present

## 2022-10-13 MED ORDER — QSYMIA 15-92 MG PO CP24: ORAL_CAPSULE | ORAL | 0 refills | Status: DC

## 2022-10-21 NOTE — Progress Notes (Signed)
Chief Complaint:   OBESITY Brandi Kirk is here to discuss her progress with her obesity treatment plan along with follow-up of her obesity related diagnoses. Brandi Kirk is on keeping a food journal and adhering to recommended goals of 1200 calories and 75 protein and states she is following her eating plan approximately 70% of the time. Brandi Kirk states she is exercising 0 minutes 0 times per week.  Today's visit was #: 12 Starting weight: 262 lbs Starting date: 11/27/2021 Today's weight: 188 lbs Today's date: 10/13/2022 Total lbs lost to date: 74 lbs Total lbs lost since last in-office visit: 8  Interim History: Brandi Kirk has overall done well with weight loss.  Taking Qsymia dose 4.  Denies side effects.  Denies chest pain, shortness of breath and palpations.  Her highest weight was 329 lbs.  She is drinking more water.  Subjective:   1. Abnormal cravings-  medication induced Taking Qysmia dose 4.  Denies any side effects.  Denies any chest pain,shortness of breath or palpitations.  Doing well with Qysmia.  2. SOBOE (shortness of breath on exertion) Last IC was 1627 on 11/27/21.  Today's IC was 1325.  Assessment/Plan:   1. Abnormal cravings-  medication induced Will refill Qsymia 15-92 mg daily for 1 month with 0 refills.  Side effects discussed.   -Refill Phentermine-Topiramate (QSYMIA) 15-92 MG CP24; Take one tablet po daily  Dispense: 30 capsule; Refill: 0  2. SOBOE (shortness of breath on exertion) Worsening.  Work on water, protein and activity. Reviewed with patient.    3. Obesity with current BMI of 33.4 Brandi Kirk is currently in the action stage of change. As such, her goal is to continue with weight loss efforts. She has agreed to keeping a food journal and adhering to recommended goals of 1200 calories and 80+ grams of  protein.   Exercise goals: All adults should avoid inactivity. Some physical activity is better than none, and adults who participate in any amount of physical  activity gain some health benefits.  Behavioral modification strategies: increasing lean protein intake, increasing vegetables, and increasing water intake.  Brandi Kirk has agreed to follow-up with our clinic in 4 weeks. She was informed of the importance of frequent follow-up visits to maximize her success with intensive lifestyle modifications for her multiple health conditions.   Objective:   Blood pressure 107/72, pulse 86, temperature 98 F (36.7 C), height 5\' 3"  (1.6 m), weight 188 lb (85.3 kg), SpO2 100 %. Body mass index is 33.3 kg/m.  General: Cooperative, alert, well developed, in no acute distress. HEENT: Conjunctivae and lids unremarkable. Cardiovascular: Regular rhythm.  Lungs: Normal work of breathing. Neurologic: No focal deficits.   Lab Results  Component Value Date   CREATININE 0.67 09/15/2022   BUN 9 09/15/2022   NA 140 09/15/2022   K 4.1 09/15/2022   CL 104 09/15/2022   CO2 21 09/15/2022   Lab Results  Component Value Date   ALT 23 09/15/2022   AST 16 09/15/2022   ALKPHOS 78 09/15/2022   BILITOT 0.5 09/15/2022   Lab Results  Component Value Date   HGBA1C 5.3 05/30/2022   HGBA1C 5.3 11/27/2021   HGBA1C 5.3 04/20/2018   HGBA1C 5.2 11/25/2016   Lab Results  Component Value Date   INSULIN 9.2 11/27/2021   Lab Results  Component Value Date   TSH 1.260 05/30/2022   Lab Results  Component Value Date   CHOL 164 09/15/2022   HDL 48 09/15/2022   LDLCALC 105 (H)  09/15/2022   TRIG 52 09/15/2022   CHOLHDL 4.7 (H) 05/30/2022   Lab Results  Component Value Date   VD25OH 30.6 09/15/2022   VD25OH 33.0 05/30/2022   VD25OH 38.2 11/27/2021   Lab Results  Component Value Date   WBC 8.9 05/30/2022   HGB 13.5 05/30/2022   HCT 39.9 05/30/2022   MCV 92 05/30/2022   PLT 280 05/30/2022   Lab Results  Component Value Date   IRON 85 05/30/2022   TIBC 251 05/30/2022   FERRITIN 282 (H) 09/15/2022   Attestation Statements:   Reviewed by clinician on day  of visit: allergies, medications, problem list, medical history, surgical history, family history, social history, and previous encounter notes.  I, Brendell Tyus, RMA, am acting as transcriptionist for [name], FNP.  I have reviewed the above documentation for accuracy and completeness, and I agree with the above. [name], FNP

## 2022-10-22 ENCOUNTER — Ambulatory Visit
Admission: RE | Admit: 2022-10-22 | Discharge: 2022-10-22 | Disposition: A | Discharge status: Home / Self Care | Payer: 59 | Source: Ambulatory Visit | Attending: Obstetrics and Gynecology | Admitting: Obstetrics and Gynecology

## 2022-10-22 DIAGNOSIS — N644 Mastodynia: Secondary | ICD-10-CM

## 2022-10-22 DIAGNOSIS — R922 Inconclusive mammogram: Secondary | ICD-10-CM | POA: Diagnosis not present

## 2022-10-22 DIAGNOSIS — N6011 Diffuse cystic mastopathy of right breast: Secondary | ICD-10-CM | POA: Diagnosis not present

## 2022-10-22 DIAGNOSIS — N6012 Diffuse cystic mastopathy of left breast: Secondary | ICD-10-CM | POA: Diagnosis not present

## 2022-10-24 DIAGNOSIS — F411 Generalized anxiety disorder: Secondary | ICD-10-CM | POA: Diagnosis not present

## 2022-10-24 DIAGNOSIS — R69 Illness, unspecified: Secondary | ICD-10-CM | POA: Diagnosis not present

## 2022-10-30 ENCOUNTER — Telehealth: Payer: Self-pay

## 2022-10-30 NOTE — Telephone Encounter (Signed)
Patient would like all her billing from 07/02/22 to now to be looked at it looks like nothing had been paid by her insurance. Patient has [employer].

## 2022-11-10 ENCOUNTER — Ambulatory Visit (INDEPENDENT_AMBULATORY_CARE_PROVIDER_SITE_OTHER): Payer: 59 | Admitting: Nurse Practitioner

## 2022-11-10 ENCOUNTER — Encounter: Payer: Self-pay | Admitting: Nurse Practitioner

## 2022-11-10 VITALS — BP 111/77 | HR 77 | Temp 98.6°F | Ht 63.0 in | Wt 180.0 lb

## 2022-11-10 DIAGNOSIS — R638 Other symptoms and signs concerning food and fluid intake: Secondary | ICD-10-CM

## 2022-11-10 DIAGNOSIS — Z6833 Body mass index (BMI) 33.0-33.9, adult: Secondary | ICD-10-CM | POA: Diagnosis not present

## 2022-11-10 MED ORDER — QSYMIA 15-92 MG PO CP24: ORAL_CAPSULE | ORAL | 0 refills | Status: DC

## 2022-11-10 NOTE — Progress Notes (Signed)
Office: [phone]  /  Fax: [phone]  WEIGHT SUMMARY AND BIOMETRICS  Medical Weight Loss Height: 5' 3"$  (1.6 m) Weight: 180 lb (81.6 kg) Temp: 98.6 F (37 C) Pulse Rate: 77 BP: 111/77 SpO2: 96 % Today's Visit #: 13 Weight at Last VIsit: 188lb Weight Lost Since Last Visit: 8lb  Body Fat %: 40.3 % Fat Mass (lbs): 72.8 lbs Muscle Mass (lbs): 102.4 lbs Total Body Water (lbs): 75.4 lbs Visceral Fat Rating : 8 Starting Date: 11/27/21 Starting Weight: 262lb Total Weight Loss (lbs): 82 lb (37.2 kg)    HPI  Chief Complaint: OBESITY  Brandi Kirk is here to discuss her progress with her obesity treatment plan. She is on the keeping a food journal and adhering to recommended goals of 1200 calories and 80 protein and states she is following her eating plan approximately 75 % of the time. She states she is exercising 0 minutes 0 times per week.   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  She was not overweight as a child. She started gaining weight during and after pregnancy. Since last office visit she has lost 8 lbs.  She overall done well with weight loss. She has lost a total of 82 lbs since her initial visit here with [place] on 11/27/21. Her highest weight was 296 lbs. She is following IF.  She is taking Qsymia dose 4. Denies side effects. Denies chest pain      Pharmacotherapy: She is taking Qsymia dose 4.    Previous pharmacotherapy: She has tried Phentermine in the past for medical weight loss.  She did have side effects of feeling jittery.   PHYSICAL EXAM:  Blood pressure 111/77, pulse 77, temperature 98.6 F (37 C), height 5' 3"$  (1.6 m), weight 180 lb (81.6 kg), SpO2 96 %. Body mass index is 31.89 kg/m.  General: She is overweight, cooperative,  alert, well developed, and in no acute distress. PSYCH: Has normal mood, affect and thought process.   Extremities: No edema.  Neurologic: No gross sensory or motor deficits. No tremors or fasciculations noted.    DIAGNOSTIC DATA REVIEWED:  BMET    Component Value Date/Time   NA 140 09/15/2022 0939   K 4.1 09/15/2022 0939   CL 104 09/15/2022 0939   CO2 21 09/15/2022 0939   GLUCOSE 94 09/15/2022 0939   GLUCOSE 107 (H) 07/17/2021 0943   BUN 9 09/15/2022 0939   CREATININE 0.67 09/15/2022 0939   CREATININE 0.52 11/11/2019 1619   CALCIUM 9.3 09/15/2022 0939   GFRNONAA >60 03/22/2019 2328   GFRAA >60 03/22/2019 2328   Lab Results  Component Value Date   HGBA1C 5.3 05/30/2022   HGBA1C 5.2 11/25/2016   Lab Results  Component Value Date   INSULIN 9.2 11/27/2021   Lab Results  Component Value Date   TSH 1.260 05/30/2022   CBC    Component Value Date/Time   WBC 8.9 05/30/2022 0953   WBC 9.3 08/12/2021 1029   RBC 4.35 05/30/2022 0953   RBC 4.59 08/12/2021 1029   HGB 13.5 05/30/2022 0953   HCT 39.9 05/30/2022 0953   PLT 280 05/30/2022 0953   MCV 92 05/30/2022 0953   MCH 31.0 05/30/2022 0953   MCH 35.0 (H) 11/11/2019 1619   MCHC 33.8 05/30/2022 0953   MCHC 33.2 08/12/2021 1029  RDW 12.0 05/30/2022 0953   Iron Studies    Component Value Date/Time   IRON 85 05/30/2022 0953   TIBC 251 05/30/2022 0953   FERRITIN 282 (H) 09/15/2022 0939   IRONPCTSAT 34 05/30/2022 0953   IRONPCTSAT 23 07/17/2021 0943   Lipid Panel     Component Value Date/Time   CHOL 164 09/15/2022 0939   TRIG 52 09/15/2022 0939   HDL 48 09/15/2022 0939   CHOLHDL 4.7 (H) 05/30/2022 0953   CHOLHDL 5 05/02/2020 1022   VLDL 36.4 05/02/2020 1022   LDLCALC 105 (H) 09/15/2022 0939   Hepatic Function Panel     Component Value Date/Time   PROT 6.7 09/15/2022 0939   ALBUMIN 4.3 09/15/2022 0939   AST 16 09/15/2022 0939   ALT 23 09/15/2022 0939   ALT 361 11/23/2017 0000   ALKPHOS 78 09/15/2022 0939    BILITOT 0.5 09/15/2022 0939   BILIDIR 0.1 11/11/2021 0947      Component Value Date/Time   TSH 1.260 05/30/2022 0953   Nutritional Lab Results  Component Value Date   VD25OH 30.6 09/15/2022   VD25OH 33.0 05/30/2022   VD25OH 38.2 11/27/2021     ASSESSMENT AND PLAN  TREATMENT PLAN FOR OBESITY:  Recommended Dietary Goals  Aman is currently in the action stage of change. As such, her goal is to continue weight management plan. She has agreed to keeping a food journal and adhering to recommended goals of 1200 calories and 80+ protein.  Behavioral Intervention  We discussed the following Behavioral Modification Strategies today: increasing lean protein intake, decreasing simple carbohydrates , increasing vegetables, increase water intake, work on meal planning and easy cooking plans, and think about ways to increase physical activity.  Additional resources provided today: NA  Recommended Physical Activity Goals  Sharea has been advised to work up to 150 minutes of moderate intensity aerobic activity a week and strengthening exercises 2-3 times per week for cardiovascular health, weight loss maintenance and preservation of muscle mass.   She has agreed to increase physical activity in their day and reduce sedentary time (increase NEAT).    Pharmacotherapy We discussed various medication options to help Mileah with her weight loss efforts and we both agreed to continue Qsymia dose 4.  ASSOCIATED CONDITIONS ADDRESSED TODAY  Abnormal cravings-  medication induced Patient is doing well with Qsymia dose 4.   -   Refill  Qsymia; Take one tablet po daily  Dispense: 30 capsule; Refill: 0. Side effects discussed   Patient knows not to get pregnant while taking as Topamax can cause birth defects.  Will continue to monitor BP and pulse while taking.    Morbid obesity ([place])  Obesity with current BMI of 33.4 -     Qsymia; Take one tablet po daily  Dispense: 30 capsule; Refill:  0      Return in about 4 weeks (around 12/08/2022).Marland Kitchen She was informed of the importance of frequent follow up visits to maximize her success with intensive lifestyle modifications for her multiple health conditions.   ATTESTASTION STATEMENTS:  Reviewed by clinician on day of visit: allergies, medications, problem list, medical history, surgical history, family history, social history, and previous encounter notes.    Ailene Rud. Delberta Folts FNP-C

## 2022-11-21 NOTE — Telephone Encounter (Signed)
I have sent an email over to billing to run again.

## 2022-11-24 DIAGNOSIS — F411 Generalized anxiety disorder: Secondary | ICD-10-CM | POA: Diagnosis not present

## 2022-11-24 DIAGNOSIS — F3132 Bipolar disorder, current episode depressed, moderate: Secondary | ICD-10-CM | POA: Diagnosis not present

## 2022-12-08 ENCOUNTER — Ambulatory Visit: Payer: 59 | Admitting: Nurse Practitioner

## 2022-12-11 ENCOUNTER — Ambulatory Visit: Payer: 59 | Admitting: Bariatrics

## 2022-12-12 DIAGNOSIS — N898 Other specified noninflammatory disorders of vagina: Secondary | ICD-10-CM | POA: Diagnosis not present

## 2022-12-12 DIAGNOSIS — Z708 Other sex counseling: Secondary | ICD-10-CM | POA: Diagnosis not present

## 2022-12-12 DIAGNOSIS — R301 Vesical tenesmus: Secondary | ICD-10-CM | POA: Diagnosis not present

## 2022-12-12 DIAGNOSIS — B3731 Acute candidiasis of vulva and vagina: Secondary | ICD-10-CM | POA: Diagnosis not present

## 2022-12-12 DIAGNOSIS — Z113 Encounter for screening for infections with a predominantly sexual mode of transmission: Secondary | ICD-10-CM | POA: Diagnosis not present

## 2022-12-12 DIAGNOSIS — Z6831 Body mass index (BMI) 31.0-31.9, adult: Secondary | ICD-10-CM | POA: Diagnosis not present

## 2022-12-14 ENCOUNTER — Encounter: Payer: Self-pay | Admitting: Nurse Practitioner

## 2022-12-15 DIAGNOSIS — I10 Essential (primary) hypertension: Secondary | ICD-10-CM | POA: Diagnosis not present

## 2022-12-15 DIAGNOSIS — Z789 Other specified health status: Secondary | ICD-10-CM | POA: Diagnosis not present

## 2022-12-15 DIAGNOSIS — B9620 Unspecified Escherichia coli [E. coli] as the cause of diseases classified elsewhere: Secondary | ICD-10-CM | POA: Diagnosis not present

## 2022-12-15 DIAGNOSIS — R300 Dysuria: Secondary | ICD-10-CM | POA: Diagnosis not present

## 2022-12-15 DIAGNOSIS — N3000 Acute cystitis without hematuria: Secondary | ICD-10-CM | POA: Diagnosis not present

## 2022-12-15 DIAGNOSIS — F32A Depression, unspecified: Secondary | ICD-10-CM | POA: Diagnosis not present

## 2022-12-15 DIAGNOSIS — Z6831 Body mass index (BMI) 31.0-31.9, adult: Secondary | ICD-10-CM | POA: Diagnosis not present

## 2022-12-16 ENCOUNTER — Ambulatory Visit: Payer: 59 | Admitting: Nurse Practitioner

## 2022-12-22 ENCOUNTER — Encounter: Payer: Self-pay | Admitting: Nurse Practitioner

## 2022-12-22 ENCOUNTER — Ambulatory Visit (INDEPENDENT_AMBULATORY_CARE_PROVIDER_SITE_OTHER): Payer: 59 | Admitting: Nurse Practitioner

## 2022-12-22 VITALS — BP 122/72 | HR 91 | Wt 188.8 lb

## 2022-12-22 DIAGNOSIS — R1033 Periumbilical pain: Secondary | ICD-10-CM | POA: Diagnosis not present

## 2022-12-22 DIAGNOSIS — R110 Nausea: Secondary | ICD-10-CM | POA: Diagnosis not present

## 2022-12-22 DIAGNOSIS — F411 Generalized anxiety disorder: Secondary | ICD-10-CM | POA: Diagnosis not present

## 2022-12-22 DIAGNOSIS — F3132 Bipolar disorder, current episode depressed, moderate: Secondary | ICD-10-CM | POA: Diagnosis not present

## 2022-12-22 MED ORDER — TRAMADOL HCL 50 MG PO TABS: 50.0000 mg | ORAL_TABLET | Freq: Three times a day (TID) | ORAL | 0 refills | Status: DC | PRN

## 2022-12-22 MED ORDER — ONDANSETRON 4 MG PO TBDP: 4.0000 mg | ORAL_TABLET | Freq: Three times a day (TID) | ORAL | 1 refills | Status: AC | PRN

## 2022-12-22 NOTE — Patient Instructions (Signed)
If you start developing a fever, stop having bowel movements or only have liquid bowel movements, if your nausea gets bad or you start throwing up- let me know immediately.   I have sent the order to the CT at [facility], hopefully we will get approval quickly and get that done soon. I have also sent the referral to General Surgery for consultation.

## 2022-12-22 NOTE — Progress Notes (Signed)
Orma Render, DNP, AGNP-c [place] [address] [place], [place] [address] [phone]  Subjective:   Brandi Kirk is a 37 y.o. female presents to day for evaluation of: Possible Hernia The patient presents today with a chief complaint of a possible hernia in the abdominal region. This condition has been persistent for an extended period but has recently become more pronounced. The patient experiences protrusion and pain in the affected area, particularly when bending over, consuming excessive amounts of water or protein, or when the area is touched incorrectly. The discomfort has become disruptive to daily life, compelling the patient to walk with their hand over their abdomen for support.  The patient characterizes the pain as a 6 out of 10 on the pain scale and notes that it has been intensifying over the past six months. The pain is aggravated by specific movements, and as a result, the patient is unable to participate in activities such as play wrestling with their child. Additionally, the patient experiences dizziness, lightheadedness, and nausea when the pain peaks.  The patient has not observed any changes in bowel habits but mentions that straining during bowel movements exacerbates the pain. The patient has achieved a significant weight loss of over 100 pounds and has maintained sobriety for nearly two years. 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  The patient indicates that the pain is centered around the belly button and radiates outward. Comfort is difficult to achieve when lying flat or on their side, necessitating the patient to hold their abdomen while sleeping. The patient also reports abdominal tenderness, which is heightened by the application of pressure.  PMH, Medications, and Allergies reviewed and updated in chart as  appropriate.   ROS negative except for what is listed in HPI. Objective:  BP 122/72   Pulse 91   Wt 188 lb 12.8 oz (85.6 kg)   BMI 33.44 kg/m  Physical Exam Vitals and nursing note reviewed.  Constitutional:      Appearance: Normal appearance. She is obese.  HENT:     Head: Normocephalic.  Eyes:     Pupils: Pupils are equal, round, and reactive to light.  Cardiovascular:     Rate and Rhythm: Normal rate and regular rhythm.     Pulses: Normal pulses.     Heart sounds: Normal heart sounds.  Pulmonary:     Effort: Pulmonary effort is normal.     Breath sounds: Normal breath sounds.  Abdominal:     General: Bowel sounds are increased. There is no distension.     Palpations: Abdomen is soft.     Tenderness: There is abdominal tenderness in the periumbilical area. There is guarding. There is no right CVA tenderness or left CVA tenderness.     Comments: Separation of the rectus abdominus is present around the umbilicus. Limitations of examination for hernia due to pain.   Musculoskeletal:        General: Normal range of motion.     Cervical back: Normal range of motion.  Skin:    General: Skin is warm and dry.     Capillary Refill: Capillary refill takes less than 2 seconds.  Neurological:     General: No focal deficit present.     Mental Status: She is alert and oriented to person, place, and time.  Psychiatric:        Mood and Affect: Mood normal.  Assessment & Plan:   Problem List Items Addressed This Visit     Periumbilical abdominal pain - Primary    The patient is suspected of having an abdominal hernia. A CT scan is necessary to confirm the diagnosis and determine the severity of the condition. A referral to general surgery will be made for further evaluation and potential repair. The patient has been educated on the symptoms and warning signs of a hernia, including changes in bowel habits, fever, and chills. The patient has been instructed to seek  immediate medical attention if symptoms worsen or if incarceration is suspected. Plan: - Order CT scan to confirm diagnosis and assess severity. - Referral to general surgery for evaluation and potential repair. - Patient will seek immediate medical attention for worsening symptoms or suspected incarceration.      Relevant Orders   Ambulatory referral to General Surgery   CT Abdomen Pelvis Wo Contrast   Other Visit Diagnoses     Nausea       Relevant Medications   ondansetron (ZOFRAN-ODT) 4 MG disintegrating tablet         Orma Render, DNP, AGNP-c 01/01/2023  6:16 PM    History, Medications, Surgery, SDOH, and Family History reviewed and updated as appropriate.

## 2022-12-24 ENCOUNTER — Ambulatory Visit: Payer: 59 | Admitting: Nurse Practitioner

## 2022-12-24 ENCOUNTER — Telehealth (INDEPENDENT_AMBULATORY_CARE_PROVIDER_SITE_OTHER): Payer: Self-pay | Admitting: Nurse Practitioner

## 2022-12-24 NOTE — Telephone Encounter (Signed)
I have spoken to patient in regard to 3 no shows.  Patient is aware of no show policy/ cancellation policy.

## 2023-01-01 DIAGNOSIS — R1033 Periumbilical pain: Secondary | ICD-10-CM | POA: Insufficient documentation

## 2023-01-01 NOTE — Assessment & Plan Note (Signed)
The patient is suspected of having an abdominal hernia. A CT scan is necessary to confirm the diagnosis and determine the severity of the condition. A referral to general surgery will be made for further evaluation and potential repair. The patient has been educated on the symptoms and warning signs of a hernia, including changes in bowel habits, fever, and chills. The patient has been instructed to seek immediate medical attention if symptoms worsen or if incarceration is suspected. Plan: - Order CT scan to confirm diagnosis and assess severity. - Referral to general surgery for evaluation and potential repair. - Patient will seek immediate medical attention for worsening symptoms or suspected incarceration.

## 2023-01-05 ENCOUNTER — Ambulatory Visit (INDEPENDENT_AMBULATORY_CARE_PROVIDER_SITE_OTHER): Payer: 59 | Admitting: Nurse Practitioner

## 2023-01-05 ENCOUNTER — Other Ambulatory Visit: Payer: Self-pay | Admitting: Nurse Practitioner

## 2023-01-05 ENCOUNTER — Encounter: Payer: Self-pay | Admitting: Nurse Practitioner

## 2023-01-05 VITALS — BP 108/71 | HR 78 | Temp 99.3°F | Ht 63.0 in | Wt 187.0 lb

## 2023-01-05 DIAGNOSIS — Z6833 Body mass index (BMI) 33.0-33.9, adult: Secondary | ICD-10-CM

## 2023-01-05 DIAGNOSIS — R638 Other symptoms and signs concerning food and fluid intake: Secondary | ICD-10-CM | POA: Diagnosis not present

## 2023-01-05 DIAGNOSIS — E669 Obesity, unspecified: Secondary | ICD-10-CM

## 2023-01-05 MED ORDER — ZEPBOUND 2.5 MG/0.5ML ~~LOC~~ SOAJ: 2.5000 mg | SUBCUTANEOUS | 0 refills | Status: DC

## 2023-01-05 NOTE — Patient Instructions (Addendum)
What is a GLP-1 Glucagon like peptide-1 (GLP-1) agonists represent a class of medications used to treat type 2 diabetes mellitus and obesity.  GLP-1 medications mimic the action of a hormone called glucagon like peptide 1.  When blood sugar levels start to rise/increase these drugs stimulate the body to produce more insulin.  When that happens, the extra insulin helps to lower the blood sugar levels in the body.  This in returns helps with decreasing cravings.  These medications also slow the movement of food from the stomach into the small intestine.  This in return helps one to full faster and longer.   Diabetic medications: Approved for treatment of diabetes mellitus but does not have full approval for weight loss use Victoza (liraglutide) Ozempic (semaglutide) Mounjaro Trulicity Rybelsus  Weight loss medications: Approved for long-term weight loss use.        Saxenda (liraglutide) Wegovy (semaglutide) Zepbound  Contraindications:  Pancreatitis (active gallstones) Medullary thyroid cancer High triglycerides (>500)-will need labs prior to starting Multiple Endocrine Neoplasia syndrome type 2 (MEN 2) Trying to get pregnant Breastfeeding Use with caution with taking insulin or sulfonylureas (will need to monitor blood sugars for hypoglycemia) Side effects (most common): Most common side effects are nausea, gas, bloating and constipation.  Other possible side effects are headaches, belching, diarrhea, tiredness (fatigue), vomiting, upset stomach, dizziness, heartburn and stomach (abdominal pain).  If you think that you are becoming dehydrated, please inform our office or your primary family provider.  Stop immediately and go to ER if you have any symptoms of a serious allergic reaction including swelling of your face, lips, tongue or throat; problems breathing or swallowing; severe rash or itching; fainting or feeling dizzy; or very rapid heart rate.         Steps to starting your start  Contrave  The office will send a prior authorization request to your insurance company for approval. We will send you a mychart message once we hear back from your insurance with a decision.  This can take up to 7-10 business days.   Is Contrave an option for me? Do not take Contrave if you:   Have uncontrolled hypertension  Have or have had seizures  Use other medicines that contain bupropion such as Wellbutrin, Wellbutrin SR, Wellbutrin XL, and Aplenzin.  Have or have had an eating disorder called anorexia (eating very little) or bulimia (eating too much and vomiting to avoid gaining weight)  Are dependent on opioid pain medicines or use medicines to help stop taking opioids such as methadone or buprenorphine, or are in opiate withdrawal  Drink a lot of alcohol and abruptly stop drinking, or use medicines called sedatives (these make you sleepy), benzodiazepines, or anti-seizure medicines and you stop using them all of a sudden  Are taking medicines called monoamine oxidase inhibitors (MAOIs). Ask your healthcare provider or pharmacist if you are not sure if you take an MAOI, including linezoid. Do not start Contrave until you have stopped taking your MAOI for at least 14 days.  Are allergic to naltrexone HCl or bupropion HCl or any of the ingredients in Contrave. See the end of this Medication Guide for a complete list of ingredients in Contrave.  Are pregnant or planning to become pregnant. Tell your healthcare provider right away if you become pregnant while taking CONTRAVE  What is Contrave and how does it work?  Contrave is a prescription only medicine to help with your weight loss. It works on an area of the brain that   controls your appetite.  It is a combination of two medicines that are extended release: naltrexone HCL and bupropion HCL.  One of the ingredients in Contrave, bupropion, is the same ingredient in some other medicines used to treat depression and to help people  quit smoking. However, Contrave is not approved to treat depression or other mental illnesses, or to help people quit smoking (smoking cessation).   This medicine will be most effective when combined with a reduced calorie diet and physical activity.  How should I take Contrave?  Take exactly as your provider tells you to. It is an increasing dose according to the following chart:  Contrave How should I take Contrave?   It is best to take Contrave with food. However, do not take with high-fat meals.   Swallow the extended-release tablet whole. Do not crush, break, or chew it.   If you miss a dose, skip the missed dose and go back to your regular dosing schedule. Do not take extra medicine to make up for a missed dose.  Do not take more than 2 tablets in the morning and 2 tablets in the evening.  Do not take more than 2 tablets at the same time or more than 4 tablets in 1 day.  Do not stop taking Contrave without talking to your provider.   Stopping Contrave suddenly can cause serious side effects, such as seizures.  Swallow Contrave tablets whole. Do not cut, chew, or crush tablets.  Do not take Contrave when eating a high-fat meal. It may increase your risk of seizures.   What should I avoid while taking Contrave?  You should avoid other medicines that contain bupropion such as Wellbutrin, Wellbutrin SR, Wellbutrin XL and Aplenzin.  You should avoid opioid pain medications or medicines to help stop taking opioids such as methadone or buprenorphine. There may be a risk of opioid overdose.  Do not drink alcohol while taking Contrave. If you drink alcohol, talk to your provider before starting Contrave.   Avoid eating a high-fat meal at the same time you take Contrave. It may increase your risk of seizures.   Women who can become pregnant: Use effective birth control (contraception) consistently while taking Contrave. If you miss a menstrual period, STOP Contrave and call our  office immediately. Monthly pregnancy tests will be performed at your appointment if indicated.  What side effects may I notice when taking Contrave?  Side effects that usually do not require medical attention (report to our office if they continue or are bothersome): o Nausea o Constipation (you may take over the counter laxative if needed) o Headache o Dry mouth (drink at least 64 oz. of fluid daily) o Trouble sleeping (insomnia) o Vomiting o Dizziness o Diarrhea   Side effects that you should report to our office as soon as possible: o Seizures: DO NOT take Contrave again o Depression or severe changes in mood o Thoughts about suicide or dying o Feeling anxious, agitated, irritable, restless, or nervous o Slowed breathing and/or shallow breathing o Severe drowsiness o Confusion o Signs of allergic reaction such as rash, itching, hives, fever, swollen lymph glands, painful sores in your mouth or around your eyes, swelling of your lips or tongue, chest pain, or trouble breathing o Increased blood pressure o Increased heart rate or palpitations o Stomach pain lasting more than a few days o Dark urine o Yellowing of the whites of your eyes o Severe tiredness o Sudden changes in vision o Swelling or redness   in or around the eye  o Low blood sugar in Type 2 Diabetes.   Other important information  While taking CONTRAVE, you or your family members should pay close attention to any changes, especially sudden changes, in mood, behaviors, thoughts, or feelings and maintain communication with your provider. You will be asked to sign an informed consent prior to starting Contrave.  If another healthcare provider prescribes narcotic pain medication for you, please call our office immediately for advice regarding Contrave.  Your prescription will be sent electronically to your pharmacy.   Refills will require an office visit                                                                                      

## 2023-01-05 NOTE — Progress Notes (Signed)
Office: [phone]  /  Fax: [phone]  WEIGHT SUMMARY AND BIOMETRICS  No data recorded Weight Gained Since Last Visit: 7 lb   Vitals Temp: 99.3 F (37.4 C) BP: 108/71 Pulse Rate: 78 SpO2: 98 %   Anthropometric Measurements Height: 5\' 3"  (1.6 m) Weight: 187 lb (84.8 kg) BMI (Calculated): 33.13 Weight at Last Visit: 180lb Weight Gained Since Last Visit: 7 lb Starting Weight: 262 lb Total Weight Loss (lbs): 76 lb (34.5 kg)   Body Composition  Body Fat %: 42.8 % Fat Mass (lbs): 80.4 lbs Muscle Mass (lbs): 102 lbs Total Body Water (lbs): 81.6 lbs Visceral Fat Rating : 9   Other Clinical Data Fasting: No Labs: No Today's Visit #: 14 Starting Date: 11/27/21     HPI  Chief Complaint: OBESITY  Rehmat is here to discuss her progress with her obesity treatment plan. She is on the the Category 1 Plan and states she is following her eating plan approximately 40 % of the time. She states she is not exercising.    Interval History:  Since last office visit she has gained 7 pounds.  She is under a lot of stress at home and work and hasn't been able to follow the plan as she would like.  She is stress eating and is not sleeping well at night.  She is pain due to her hernia and is schedule for a CT on 01/14/23.  If she eats a large meal, she is having a lot of swelling.  She is eating multiple times per day and feels that she is getting in more calories.  She is craving sugar.  She is not drinking enough water.  She is drinking energy drinks, seltzer water.  She hasn't taken any meds in the past 2 weeks.     Pharmacotherapy for weight loss: She is currently taking Qsymia (taking 2-3 times per week but hasn't taken for the past 2 weeks) for medical weight loss.  Denies side effects.  She doesn't feel that the Qsymia is working as well as it has in the past.    Previous pharmacotherapy for medical weight loss:  She has tried Phentermine in the past for medical weight loss.  She did have side effects of feeling jittery.   Bariatric surgery:  Patient hasn't had bariatric surgery.     PHYSICAL EXAM:  Blood pressure 108/71, pulse 78, temperature 99.3 F (37.4 C), height 5\' 3"  (1.6 m), weight 187 lb (84.8 kg), SpO2 98 %. Body mass index is 33.13 kg/m.  General: She is overweight, cooperative, alert, well developed, and in no acute distress. PSYCH: Has normal mood, affect and thought process.   Extremities: No edema.  Neurologic: No gross sensory or motor deficits. No tremors or fasciculations noted.    DIAGNOSTIC DATA REVIEWED:  BMET    Component Value Date/Time   NA 140 09/15/2022 0939   K 4.1 09/15/2022 0939   CL 104 09/15/2022 0939   CO2 21 09/15/2022 0939   GLUCOSE 94 09/15/2022 0939   GLUCOSE 107 (H) 07/17/2021 0943   BUN 9 09/15/2022 0939   CREATININE 0.67 09/15/2022 0939   CREATININE 0.52 11/11/2019 1619   CALCIUM 9.3 09/15/2022 0939   GFRNONAA >60 03/22/2019 2328   GFRAA >60 03/22/2019 2328   Lab Results  Component Value Date   HGBA1C 5.3 05/30/2022   HGBA1C 5.2 11/25/2016   Lab Results  Component Value Date   INSULIN 9.2 11/27/2021   Lab Results  Component Value Date  TSH 1.260 05/30/2022   CBC    Component Value Date/Time   WBC 8.9 05/30/2022 0953   WBC 9.3 08/12/2021 1029   RBC 4.35 05/30/2022 0953   RBC 4.59 08/12/2021 1029   HGB 13.5 05/30/2022 0953   HCT 39.9 05/30/2022 0953   PLT 280 05/30/2022 0953   MCV 92 05/30/2022 0953   MCH 31.0 05/30/2022 0953   MCH 35.0 (H) 11/11/2019 1619   MCHC 33.8 05/30/2022 0953   MCHC 33.2 08/12/2021 1029   RDW 12.0 05/30/2022 0953   Iron Studies    Component Value Date/Time   IRON 85 05/30/2022 0953   TIBC 251 05/30/2022 0953   FERRITIN 282 (H) 09/15/2022 0939   IRONPCTSAT 34 05/30/2022 0953   IRONPCTSAT 23 07/17/2021 0943   Lipid Panel     Component Value Date/Time   CHOL 164 09/15/2022 0939   TRIG 52 09/15/2022 0939   HDL 48 09/15/2022 0939   CHOLHDL 4.7 (H)  05/30/2022 0953   CHOLHDL 5 05/02/2020 1022   VLDL 36.4 05/02/2020 1022   LDLCALC 105 (H) 09/15/2022 0939   Hepatic Function Panel     Component Value Date/Time   PROT 6.7 09/15/2022 0939   ALBUMIN 4.3 09/15/2022 0939   AST 16 09/15/2022 0939   ALT 23 09/15/2022 0939   ALT 361 11/23/2017 0000   ALKPHOS 78 09/15/2022 0939   BILITOT 0.5 09/15/2022 0939   BILIDIR 0.1 11/11/2021 0947      Component Value Date/Time   TSH 1.260 05/30/2022 0953   Nutritional Lab Results  Component Value Date   VD25OH 30.6 09/15/2022   VD25OH 33.0 05/30/2022   VD25OH 38.2 11/27/2021     ASSESSMENT AND PLAN  TREATMENT PLAN FOR OBESITY:  Recommended Dietary Goals  Kensli is currently in the action stage of change. As such, her goal is to continue weight management plan. She has agreed to the Category 1 Plan.  Behavioral Intervention  We discussed the following Behavioral Modification Strategies today: increasing lean protein intake, decreasing simple carbohydrates , increasing vegetables, avoiding skipping meals, increasing water intake, and work on meal planning and preparation.  Additional resources provided today: NA  Recommended Physical Activity Goals  Maelee has been advised to work up to 150 minutes of moderate intensity aerobic activity a week and strengthening exercises 2-3 times per week for cardiovascular health, weight loss maintenance and preservation of muscle mass.   She has agreed to Think about ways to increase physical activity and Work on scheduling and tracking physical activity.    Pharmacotherapy We discussed various medication options to help Erleen with her weight loss efforts and we both agreed to start Zepbound 2.5mg  and stop Qsymia.  Consider Contrave.  Information given.   Contraindications: Pancreatitis (active gallstones) Medullary thyroid cancer High triglycerides (>500)-will need labs prior to starting Multiple Endocrine Neoplasia syndrome type 2 (MEN  2) Trying to get pregnant Breastfeeding Use with caution with taking insulin or sulfonylureas (will need to monitor blood sugars for hypoglycemia)  ASSOCIATED CONDITIONS ADDRESSED TODAY  Action/Plan  Abnormal craving Consider topamax.  Side effects discussed.   Generalized obesity -     Zepbound; Inject 2.5 mg into the skin once a week.  Dispense: 2 mL; Refill: 0  BMI 33.0-33.9,adult -     Zepbound; Inject 2.5 mg into the skin once a week.  Dispense: 2 mL; Refill: 0        To contact PCP about referral to surgeon and about seeing if can get CT  rescheduled to an earlier time.    Return in about 3 weeks (around 01/26/2023).Marland Kitchen. She was informed of the importance of frequent follow up visits to maximize her success with intensive lifestyle modifications for her multiple health conditions.   ATTESTASTION STATEMENTS:  Reviewed by clinician on day of visit: allergies, medications, problem list, medical history, surgical history, family history, social history, and previous encounter notes.     Theodis SatoStephanie R. Genora Arp FNP-C

## 2023-01-06 ENCOUNTER — Telehealth (INDEPENDENT_AMBULATORY_CARE_PROVIDER_SITE_OTHER): Payer: Self-pay | Admitting: Nurse Practitioner

## 2023-01-06 ENCOUNTER — Telehealth: Payer: Self-pay

## 2023-01-06 NOTE — Telephone Encounter (Signed)
Patient called and stated her Zepbound was denied by her insurance and wanted to know her next options. Please give her a call to discuss

## 2023-01-06 NOTE — Telephone Encounter (Signed)
PA submitted through Cover My Meds for Zepbound. Awaiting insurance determination. Key: [id number]

## 2023-01-06 NOTE — Telephone Encounter (Signed)
Patient also stated, if there is no other options can we do an appeal.

## 2023-01-07 NOTE — Telephone Encounter (Signed)
PA is still pending.  

## 2023-01-08 NOTE — Telephone Encounter (Signed)
Mychart message sent to patient.

## 2023-01-08 NOTE — Telephone Encounter (Signed)
Received fax from [facility] that Zepbound was denied. "Drug not covered/Plan exclusion-your request for coverage was denied because your prescription benefit plan does not cover the requested medicine"

## 2023-01-14 ENCOUNTER — Ambulatory Visit (HOSPITAL_COMMUNITY)
Admission: RE | Admit: 2023-01-14 | Discharge: 2023-01-14 | Disposition: A | Discharge status: Home / Self Care | Payer: 59 | Source: Ambulatory Visit | Attending: Nurse Practitioner | Admitting: Nurse Practitioner

## 2023-01-14 DIAGNOSIS — R1033 Periumbilical pain: Secondary | ICD-10-CM | POA: Insufficient documentation

## 2023-01-15 ENCOUNTER — Other Ambulatory Visit: Payer: Self-pay | Admitting: Nurse Practitioner

## 2023-01-15 ENCOUNTER — Encounter: Payer: Self-pay | Admitting: Nurse Practitioner

## 2023-01-19 ENCOUNTER — Other Ambulatory Visit: Payer: Self-pay | Admitting: Nurse Practitioner

## 2023-01-19 ENCOUNTER — Other Ambulatory Visit: Payer: Self-pay

## 2023-01-19 DIAGNOSIS — K458 Other specified abdominal hernia without obstruction or gangrene: Secondary | ICD-10-CM

## 2023-01-19 DIAGNOSIS — R1033 Periumbilical pain: Secondary | ICD-10-CM

## 2023-01-19 NOTE — Telephone Encounter (Signed)
Refill request last apt 12/22/22.

## 2023-01-23 DIAGNOSIS — F411 Generalized anxiety disorder: Secondary | ICD-10-CM | POA: Diagnosis not present

## 2023-01-23 DIAGNOSIS — F3132 Bipolar disorder, current episode depressed, moderate: Secondary | ICD-10-CM | POA: Diagnosis not present

## 2023-01-26 ENCOUNTER — Ambulatory Visit (INDEPENDENT_AMBULATORY_CARE_PROVIDER_SITE_OTHER): Payer: 59 | Admitting: Nurse Practitioner

## 2023-01-26 ENCOUNTER — Encounter: Payer: Self-pay | Admitting: Nurse Practitioner

## 2023-01-26 VITALS — BP 105/70 | HR 78 | Temp 98.1°F | Ht 63.0 in | Wt 186.0 lb

## 2023-01-26 DIAGNOSIS — E669 Obesity, unspecified: Secondary | ICD-10-CM

## 2023-01-26 DIAGNOSIS — Z6832 Body mass index (BMI) 32.0-32.9, adult: Secondary | ICD-10-CM

## 2023-01-26 DIAGNOSIS — R638 Other symptoms and signs concerning food and fluid intake: Secondary | ICD-10-CM | POA: Diagnosis not present

## 2023-01-26 NOTE — Progress Notes (Signed)
Office: [phone]  /  Fax: [phone]  WEIGHT SUMMARY AND BIOMETRICS  Weight Lost Since Last Visit: 1lb  No data recorded  Vitals Temp: 98.1 F (36.7 C) BP: 105/70 Pulse Rate: 78 SpO2: 99 %   Anthropometric Measurements Height: 5\' 3"  (1.6 m) Weight: 186 lb (84.4 kg) BMI (Calculated): 32.96 Weight at Last Visit: 187lb Weight Lost Since Last Visit: 1lb Starting Weight: 262lb Total Weight Loss (lbs): 76 lb (34.5 kg)   Body Composition  Body Fat %: 43 % Fat Mass (lbs): 80.2 lbs Muscle Mass (lbs): 101 lbs Total Body Water (lbs): 81.4 lbs Visceral Fat Rating : 9   Other Clinical Data Fasting: No Labs: No Today's Visit #: 15 Starting Date: 11/27/21     HPI  Chief Complaint: OBESITY  Brandi Kirk is here to discuss her progress with her obesity treatment plan. She is on the the Category 1 Plan and states she is following her eating plan approximately 30 % of the time. She states she is exercising 0 minutes 0 days per week.   Interval History:  Since last office visit she has lost 1 pound.  She is struggling with hunger and cravings since stopping Qsymia and starting Zepbound.  She is not skipping meals.  Felt she did well with IF 11am-7pm.  She feels she is snacking more.  Has been eating more fruit, yogurt, cottage cheese.   If she eats more portein at once she has more abd pain/discomfort.  Had CT scan on 01/14/23.  Has been referred to a general surgeon.     Pharmacotherapy for weight loss: She is currently taking Zepbound 2.5mg  (taken 1 dose-next dose is due tomorrow) for medical weight loss.  Denies side effects.   Previous pharmacotherapy for medical weight loss: She has tried Phentermine in the past for medical weight loss. She did have side effects of feeling jittery.  She has recently tried Qsymia.   Bariatric surgery:  Patient has not had bariatric surgery.    PHYSICAL EXAM:  Blood pressure 105/70, pulse 78, temperature 98.1 F (36.7 C), height 5\' 3"   (1.6 m), weight 186 lb (84.4 kg), SpO2 99 %. Body mass index is 32.95 kg/m.  General: She is overweight, cooperative, alert, well developed, and in no acute distress. PSYCH: Has normal mood, affect and thought process.   Extremities: No edema.  Neurologic: No gross sensory or motor deficits. No tremors or fasciculations noted.    DIAGNOSTIC DATA REVIEWED:  BMET    Component Value Date/Time   NA 140 09/15/2022 0939   K 4.1 09/15/2022 0939   CL 104 09/15/2022 0939   CO2 21 09/15/2022 0939   GLUCOSE 94 09/15/2022 0939   GLUCOSE 107 (H) 07/17/2021 0943   BUN 9 09/15/2022 0939   CREATININE 0.67 09/15/2022 0939   CREATININE 0.52 11/11/2019 1619   CALCIUM 9.3 09/15/2022 0939   GFRNONAA >60 03/22/2019 2328   GFRAA >60 03/22/2019 2328   Lab Results  Component Value Date   HGBA1C 5.3 05/30/2022   HGBA1C 5.2 11/25/2016   Lab Results  Component Value Date   INSULIN 9.2 11/27/2021   Lab Results  Component Value Date   TSH 1.260 05/30/2022   CBC    Component Value Date/Time   WBC 8.9 05/30/2022 0953   WBC 9.3 08/12/2021 1029   RBC 4.35 05/30/2022 0953   RBC 4.59 08/12/2021 1029   HGB 13.5 05/30/2022 0953   HCT 39.9 05/30/2022 0953   PLT 280 05/30/2022 0953   MCV  92 05/30/2022 0953   MCH 31.0 05/30/2022 0953   MCH 35.0 (H) 11/11/2019 1619   MCHC 33.8 05/30/2022 0953   MCHC 33.2 08/12/2021 1029   RDW 12.0 05/30/2022 0953   Iron Studies    Component Value Date/Time   IRON 85 05/30/2022 0953   TIBC 251 05/30/2022 0953   FERRITIN 282 (H) 09/15/2022 0939   IRONPCTSAT 34 05/30/2022 0953   IRONPCTSAT 23 07/17/2021 0943   Lipid Panel     Component Value Date/Time   CHOL 164 09/15/2022 0939   TRIG 52 09/15/2022 0939   HDL 48 09/15/2022 0939   CHOLHDL 4.7 (H) 05/30/2022 0953   CHOLHDL 5 05/02/2020 1022   VLDL 36.4 05/02/2020 1022   LDLCALC 105 (H) 09/15/2022 0939   Hepatic Function Panel     Component Value Date/Time   PROT 6.7 09/15/2022 0939   ALBUMIN 4.3  09/15/2022 0939   AST 16 09/15/2022 0939   ALT 23 09/15/2022 0939   ALT 361 11/23/2017 0000   ALKPHOS 78 09/15/2022 0939   BILITOT 0.5 09/15/2022 0939   BILIDIR 0.1 11/11/2021 0947      Component Value Date/Time   TSH 1.260 05/30/2022 0953   Nutritional Lab Results  Component Value Date   VD25OH 30.6 09/15/2022   VD25OH 33.0 05/30/2022   VD25OH 38.2 11/27/2021     ASSESSMENT AND PLAN  TREATMENT PLAN FOR OBESITY:  Recommended Dietary Goals  Brandi Kirk is currently in the action stage of change. As such, her goal is to continue weight management plan. She has agreed to the Category 1 Plan.  Behavioral Intervention  We discussed the following Behavioral Modification Strategies today: increasing lean protein intake, decreasing simple carbohydrates , increasing vegetables, increasing lower glycemic fruits, increasing fiber rich foods, avoiding skipping meals, increasing water intake, continue to practice mindfulness when eating, and planning for success.  Additional resources provided today: NA  Recommended Physical Activity Goals  Brandi Kirk has been advised to work up to 150 minutes of moderate intensity aerobic activity a week and strengthening exercises 2-3 times per week for cardiovascular health, weight loss maintenance and preservation of muscle mass.   She has agreed to Think about ways to increase physical activity, Increase physical activity in their day and reduce sedentary time (increase NEAT)., and Work on scheduling and tracking physical activity.    Pharmacotherapy We discussed various medication options to help Brandi Kirk with her weight loss efforts and we both agreed to continue Zepbound 2.5mg . Patient has an IUD.  Recommened additional birth control while increasing Zepbound dose .   ASSOCIATED CONDITIONS ADDRESSED TODAY  Action/Plan  Abnormal craving Consider Topamax at next visit.   Generalized obesity  BMI 32.0-32.9,adult          Return in about 3  weeks (around 02/16/2023).Marland Kitchen She was informed of the importance of frequent follow up visits to maximize her success with intensive lifestyle modifications for her multiple health conditions.   ATTESTASTION STATEMENTS:  Reviewed by clinician on day of visit: allergies, medications, problem list, medical history, surgical history, family history, social history, and previous encounter notes.   Time spent on visit including pre-visit chart review and post-visit care and charting was 30 minutes.    Theodis Sato. Risa Auman FNP-C

## 2023-02-02 ENCOUNTER — Ambulatory Visit: Payer: 59 | Admitting: Nurse Practitioner

## 2023-02-10 DIAGNOSIS — K7030 Alcoholic cirrhosis of liver without ascites: Secondary | ICD-10-CM | POA: Diagnosis not present

## 2023-02-10 DIAGNOSIS — Z791 Long term (current) use of non-steroidal anti-inflammatories (NSAID): Secondary | ICD-10-CM | POA: Diagnosis not present

## 2023-02-10 DIAGNOSIS — I10 Essential (primary) hypertension: Secondary | ICD-10-CM | POA: Diagnosis not present

## 2023-02-10 DIAGNOSIS — E669 Obesity, unspecified: Secondary | ICD-10-CM | POA: Diagnosis not present

## 2023-02-10 DIAGNOSIS — K219 Gastro-esophageal reflux disease without esophagitis: Secondary | ICD-10-CM | POA: Diagnosis not present

## 2023-02-10 DIAGNOSIS — F319 Bipolar disorder, unspecified: Secondary | ICD-10-CM | POA: Diagnosis not present

## 2023-02-10 DIAGNOSIS — E785 Hyperlipidemia, unspecified: Secondary | ICD-10-CM | POA: Diagnosis not present

## 2023-02-10 DIAGNOSIS — F411 Generalized anxiety disorder: Secondary | ICD-10-CM | POA: Diagnosis not present

## 2023-02-10 DIAGNOSIS — G4700 Insomnia, unspecified: Secondary | ICD-10-CM | POA: Diagnosis not present

## 2023-02-10 DIAGNOSIS — G43909 Migraine, unspecified, not intractable, without status migrainosus: Secondary | ICD-10-CM | POA: Diagnosis not present

## 2023-02-10 DIAGNOSIS — F4310 Post-traumatic stress disorder, unspecified: Secondary | ICD-10-CM | POA: Diagnosis not present

## 2023-02-10 DIAGNOSIS — Z6831 Body mass index (BMI) 31.0-31.9, adult: Secondary | ICD-10-CM | POA: Diagnosis not present

## 2023-02-12 LAB — COMPREHENSIVE METABOLIC PANEL
Albumin: 4.8 (ref 3.5–5.0)
Calcium: 9.6 (ref 8.7–10.7)
Globulin: 2.8
eGFR: 95

## 2023-02-12 LAB — CBC AND DIFFERENTIAL
HCT: 40 (ref 36–46)
Hemoglobin: 13 (ref 12.0–16.0)
Platelets: 267 10*3/uL (ref 150–400)
WBC: 7.9

## 2023-02-12 LAB — BASIC METABOLIC PANEL
BUN: 17 (ref 4–21)
CO2: 22 (ref 13–22)
Chloride: 104 (ref 99–108)
Creatinine: 0.8 (ref 0.5–1.1)
Glucose: 93
Potassium: 4.5 mEq/L (ref 3.5–5.1)
Sodium: 140 (ref 137–147)

## 2023-02-12 LAB — HEPATIC FUNCTION PANEL
ALT: 21 U/L (ref 7–35)
AST: 19 (ref 13–35)
Alkaline Phosphatase: 60 (ref 25–125)
Bilirubin, Total: 1.3

## 2023-02-12 LAB — LIPID PANEL
Cholesterol: 146 (ref 0–200)
HDL: 40 (ref 35–70)
LDL Cholesterol: 93
Triglycerides: 61 (ref 40–160)

## 2023-02-12 LAB — CBC: RBC: 4.21 (ref 3.87–5.11)

## 2023-02-12 LAB — TSH: TSH: 0.66 (ref 0.41–5.90)

## 2023-02-16 ENCOUNTER — Encounter: Payer: Self-pay | Admitting: Nurse Practitioner

## 2023-02-16 ENCOUNTER — Ambulatory Visit: Payer: 59 | Admitting: Nurse Practitioner

## 2023-02-16 ENCOUNTER — Other Ambulatory Visit: Payer: Self-pay | Admitting: Nurse Practitioner

## 2023-02-16 VITALS — BP 101/72 | HR 77 | Temp 98.2°F | Ht 63.0 in | Wt 172.0 lb

## 2023-02-16 DIAGNOSIS — Z6830 Body mass index (BMI) 30.0-30.9, adult: Secondary | ICD-10-CM

## 2023-02-16 DIAGNOSIS — E669 Obesity, unspecified: Secondary | ICD-10-CM | POA: Diagnosis not present

## 2023-02-16 DIAGNOSIS — R638 Other symptoms and signs concerning food and fluid intake: Secondary | ICD-10-CM | POA: Diagnosis not present

## 2023-02-16 DIAGNOSIS — Z6833 Body mass index (BMI) 33.0-33.9, adult: Secondary | ICD-10-CM

## 2023-02-16 MED ORDER — ZEPBOUND 2.5 MG/0.5ML ~~LOC~~ SOAJ: 2.5000 mg | SUBCUTANEOUS | 0 refills | Status: DC

## 2023-02-16 NOTE — Progress Notes (Signed)
Office: [phone]  /  Fax: [phone]  WEIGHT SUMMARY AND BIOMETRICS  Weight Lost Since Last Visit: 15lb  No data recorded  Vitals Temp: 98.2 F (36.8 C) BP: 101/72 Pulse Rate: 77 SpO2: 98 %   Anthropometric Measurements Height: 5\' 3"  (1.6 m) Weight: 172 lb (78 kg) BMI (Calculated): 30.48 Weight at Last Visit: 187lb Weight Lost Since Last Visit: 15lb Starting Weight: 262lb Total Weight Loss (lbs): 90 lb (40.8 kg)   Body Composition  Body Fat %: 39.4 % Fat Mass (lbs): 68 lbs Muscle Mass (lbs): 99.4 lbs Total Body Water (lbs): 77.4 lbs Visceral Fat Rating : 7   Other Clinical Data Fasting: No Labs: No Today's Visit #: 16 Starting Date: 11/27/21     HPI  Chief Complaint: OBESITY  Brandi Kirk is here to discuss her progress with her obesity treatment plan. She is on the the Category 1 Plan and states she is following her eating plan approximately 80-85 % of the time. She states she is exercising 0 minutes 0 days per week.   Interval History:  Since last office visit she has lost 15 pounds.  She notes she is under stress and has been struggling with meeting all her calories goals. She reports she is depressed and stressed and hasn't been eating enough.   She has struggled with cravings and feels that the Zepbound has helped with cravings. Has helped also with binge eating.  She feels full and does feel that the Zepbound help with the constant hunger and cravings. She has been eating a lot of fruit.   She is drinking water and protein juice.  She reports she went to the store yesterday and purchased multiple foods/snacks with protein.     Pharmacotherapy for weight loss: She is currently taking Zepbound 2.5mg  for medical weight loss.  Reports side effects of headaches.    Previous pharmacotherapy for medical weight loss:  She has tried Phentermine and Qsymia.  She stopped Phentermine due to side effects of feeling jittery.    Bariatric surgery:  Has not had  bariatric surgery.   PHYSICAL EXAM:  Blood pressure 101/72, pulse 77, temperature 98.2 F (36.8 C), height 5\' 3"  (1.6 m), weight 172 lb (78 kg), SpO2 98 %. Body mass index is 30.47 kg/m.  General: She is overweight, cooperative, alert, well developed, and in no acute distress. PSYCH: Has normal mood, affect and thought process.   Extremities: No edema.  Neurologic: No gross sensory or motor deficits. No tremors or fasciculations noted.    DIAGNOSTIC DATA REVIEWED:  BMET    Component Value Date/Time   NA 140 09/15/2022 0939   K 4.1 09/15/2022 0939   CL 104 09/15/2022 0939   CO2 21 09/15/2022 0939   GLUCOSE 94 09/15/2022 0939   GLUCOSE 107 (H) 07/17/2021 0943   BUN 9 09/15/2022 0939   CREATININE 0.67 09/15/2022 0939   CREATININE 0.52 11/11/2019 1619   CALCIUM 9.3 09/15/2022 0939   GFRNONAA >60 03/22/2019 2328   GFRAA >60 03/22/2019 2328   Lab Results  Component Value Date   HGBA1C 5.3 05/30/2022   HGBA1C 5.2 11/25/2016   Lab Results  Component Value Date   INSULIN 9.2 11/27/2021   Lab Results  Component Value Date   TSH 1.260 05/30/2022   CBC    Component Value Date/Time   WBC 8.9 05/30/2022 0953   WBC 9.3 08/12/2021 1029   RBC 4.35 05/30/2022 0953   RBC 4.59 08/12/2021 1029   HGB 13.5 05/30/2022  0953   HCT 39.9 05/30/2022 0953   PLT 280 05/30/2022 0953   MCV 92 05/30/2022 0953   MCH 31.0 05/30/2022 0953   MCH 35.0 (H) 11/11/2019 1619   MCHC 33.8 05/30/2022 0953   MCHC 33.2 08/12/2021 1029   RDW 12.0 05/30/2022 0953   Iron Studies    Component Value Date/Time   IRON 85 05/30/2022 0953   TIBC 251 05/30/2022 0953   FERRITIN 282 (H) 09/15/2022 0939   IRONPCTSAT 34 05/30/2022 0953   IRONPCTSAT 23 07/17/2021 0943   Lipid Panel     Component Value Date/Time   CHOL 164 09/15/2022 0939   TRIG 52 09/15/2022 0939   HDL 48 09/15/2022 0939   CHOLHDL 4.7 (H) 05/30/2022 0953   CHOLHDL 5 05/02/2020 1022   VLDL 36.4 05/02/2020 1022   LDLCALC 105 (H)  09/15/2022 0939   Hepatic Function Panel     Component Value Date/Time   PROT 6.7 09/15/2022 0939   ALBUMIN 4.3 09/15/2022 0939   AST 16 09/15/2022 0939   ALT 23 09/15/2022 0939   ALT 361 11/23/2017 0000   ALKPHOS 78 09/15/2022 0939   BILITOT 0.5 09/15/2022 0939   BILIDIR 0.1 11/11/2021 0947      Component Value Date/Time   TSH 1.260 05/30/2022 0953   Nutritional Lab Results  Component Value Date   VD25OH 30.6 09/15/2022   VD25OH 33.0 05/30/2022   VD25OH 38.2 11/27/2021     ASSESSMENT AND PLAN  TREATMENT PLAN FOR OBESITY:  Recommended Dietary Goals  Brandi Kirk is currently in the action stage of change. As such, her goal is to continue weight management plan. She has agreed to the Category 1 Plan.  Behavioral Intervention  We discussed the following Behavioral Modification Strategies today: increasing lean protein intake, decreasing simple carbohydrates , increasing vegetables, increasing lower glycemic fruits, increasing fiber rich foods, avoiding skipping meals, increasing water intake, continue to practice mindfulness when eating, and planning for success.  Additional resources provided today: NA  Recommended Physical Activity Goals  Brandi Kirk has been advised to work up to 150 minutes of moderate intensity aerobic activity a week and strengthening exercises 2-3 times per week for cardiovascular health, weight loss maintenance and preservation of muscle mass.   She has agreed to Think about ways to increase daily physical activity and overcoming barriers to exercise and Work on scheduling and tracking physical activity.    Pharmacotherapy We discussed various medication options to help Brandi Kirk with her weight loss efforts and we both agreed to continue Zepbound 2.5mg  every 8-10 days.  Will not increase dose at this time. Will consider stopping at next visit if patient is not meeting protein and calorie goals.  Patient verbalizes understanding.    ASSOCIATED CONDITIONS  ADDRESSED TODAY  Action/Plan  Abnormal craving Feels under better control since starting Zepbound.    Generalized obesity -     Zepbound; Inject 2.5 mg into the skin once a week.  Dispense: 2 mL; Refill: 0  BMI 30.0-30.9,adult -     Zepbound; Inject 2.5 mg into the skin once a week.  Dispense: 2 mL; Refill: 0      Will obtain labs at next visit if needed.  Patient had labs obtained at work recently.  Will send to me via mychart.   Goals: Increase protein and calories intake.    Return in about 3 weeks (around 03/09/2023).Marland Kitchen She was informed of the importance of frequent follow up visits to maximize her success with intensive lifestyle modifications for  her multiple health conditions.   ATTESTASTION STATEMENTS:  Reviewed by clinician on day of visit: allergies, medications, problem list, medical history, surgical history, family history, social history, and previous encounter notes.     Theodis Sato. Asim Gersten FNP-C

## 2023-02-17 ENCOUNTER — Encounter: Payer: Self-pay | Admitting: Nurse Practitioner

## 2023-02-18 DIAGNOSIS — K439 Ventral hernia without obstruction or gangrene: Secondary | ICD-10-CM | POA: Diagnosis not present

## 2023-03-09 ENCOUNTER — Other Ambulatory Visit: Payer: Self-pay | Admitting: Nurse Practitioner

## 2023-03-09 ENCOUNTER — Encounter: Payer: Self-pay | Admitting: Nurse Practitioner

## 2023-03-09 ENCOUNTER — Ambulatory Visit: Payer: 59 | Admitting: Nurse Practitioner

## 2023-03-09 VITALS — BP 101/68 | HR 66 | Temp 98.9°F | Ht 63.0 in | Wt 171.0 lb

## 2023-03-09 DIAGNOSIS — R638 Other symptoms and signs concerning food and fluid intake: Secondary | ICD-10-CM | POA: Diagnosis not present

## 2023-03-09 DIAGNOSIS — Z6830 Body mass index (BMI) 30.0-30.9, adult: Secondary | ICD-10-CM

## 2023-03-09 DIAGNOSIS — E669 Obesity, unspecified: Secondary | ICD-10-CM | POA: Diagnosis not present

## 2023-03-09 MED ORDER — ZEPBOUND 5 MG/0.5ML ~~LOC~~ SOAJ
5.0000 mg | SUBCUTANEOUS | 0 refills | Status: DC
Start: 2023-03-09 — End: 2023-03-10

## 2023-03-09 NOTE — Progress Notes (Signed)
Office: [phone]  /  Fax: [phone]  WEIGHT SUMMARY AND BIOMETRICS  Weight Lost Since Last Visit: 1lb  No data recorded  Vitals Temp: 98.9 F (37.2 C) BP: 101/68 Pulse Rate: 66 SpO2: 98 %   Anthropometric Measurements Height: 5\' 3"  (1.6 m) Weight: 171 lb (77.6 kg) BMI (Calculated): 30.3 Weight at Last Visit: 172lb Weight Lost Since Last Visit: 1lb Starting Weight: 262lb Total Weight Loss (lbs): 91 lb (41.3 kg)   Body Composition  Body Fat %: 34.9 % Fat Mass (lbs): 59.8 lbs Muscle Mass (lbs): 106 lbs Total Body Water (lbs): 79.4 lbs Visceral Fat Rating : 6   Other Clinical Data Fasting: No Labs: No Today's Visit #: 17 Starting Date: 11/27/21     HPI  Chief Complaint: OBESITY  Alline is here to discuss her progress with her obesity treatment plan. She is on the the Category 1 Plan and states she is following her eating plan approximately 85 % of the time. She states she is exercising 0 minutes 0 days per week.   Interval History:  Since last office visit she has lost 1 pound.  She is under stress and has been stress eating.  She is struggling with cravings and hunger.  She is aiming to eat as much protein as she can.  She is drinking a protein shake and water daily.  She is struggling with excess skin on her abd and thighs.  She is experiencing redness, pain and inflammation.  She is using OTC creams and prescription cream with little results.     Pharmacotherapy for weight loss: She is currently taking Zepbound 2.5mg  (4th dose tomorrow) for medical weight loss.  Denies side effects.    Previous pharmacotherapy for medical weight loss:  She has tried Phentermine and Qsymia. She stopped Phentermine due to side effects of feeling jittery.   Bariatric surgery:  Patient has not had bariatric surgery.    PHYSICAL EXAM:  Blood pressure 101/68, pulse 66, temperature 98.9 F (37.2 C), height 5\' 3"  (1.6 m), weight 171 lb (77.6 kg), SpO2 98 %. Body mass  index is 30.29 kg/m.  General: She is overweight, cooperative, alert, well developed, and in no acute distress. PSYCH: Has normal mood, affect and thought process.   Extremities: No edema.  Neurologic: No gross sensory or motor deficits. No tremors or fasciculations noted.    DIAGNOSTIC DATA REVIEWED:  BMET    Component Value Date/Time   NA 140 02/12/2023 0000   K 4.5 02/12/2023 0000   CL 104 02/12/2023 0000   CO2 22 02/12/2023 0000   GLUCOSE 94 09/15/2022 0939   GLUCOSE 107 (H) 07/17/2021 0943   BUN 17 02/12/2023 0000   CREATININE 0.8 02/12/2023 0000   CREATININE 0.67 09/15/2022 0939   CREATININE 0.52 11/11/2019 1619   CALCIUM 9.6 02/12/2023 0000   GFRNONAA >60 03/22/2019 2328   GFRAA >60 03/22/2019 2328   Lab Results  Component Value Date   HGBA1C 5.3 05/30/2022   HGBA1C 5.2 11/25/2016   Lab Results  Component Value Date   INSULIN 9.2 11/27/2021   Lab Results  Component Value Date   TSH 0.66 02/12/2023   CBC    Component Value Date/Time   WBC 7.9 02/12/2023 0000   WBC 9.3 08/12/2021 1029   RBC 4.21 02/12/2023 0000   HGB 13.0 02/12/2023 0000   HGB 13.5 05/30/2022 0953   HCT 40 02/12/2023 0000   HCT 39.9 05/30/2022 0953   PLT 267 02/12/2023 0000  PLT 280 05/30/2022 0953   MCV 92 05/30/2022 0953   MCH 31.0 05/30/2022 0953   MCH 35.0 (H) 11/11/2019 1619   MCHC 33.8 05/30/2022 0953   MCHC 33.2 08/12/2021 1029   RDW 12.0 05/30/2022 0953   Iron Studies    Component Value Date/Time   IRON 85 05/30/2022 0953   TIBC 251 05/30/2022 0953   FERRITIN 282 (H) 09/15/2022 0939   IRONPCTSAT 34 05/30/2022 0953   IRONPCTSAT 23 07/17/2021 0943   Lipid Panel     Component Value Date/Time   CHOL 146 02/12/2023 0000   CHOL 164 09/15/2022 0939   TRIG 61 02/12/2023 0000   HDL 40 02/12/2023 0000   HDL 48 09/15/2022 0939   CHOLHDL 4.7 (H) 05/30/2022 0953   CHOLHDL 5 05/02/2020 1022   VLDL 36.4 05/02/2020 1022   LDLCALC 93 02/12/2023 0000   LDLCALC 105 (H)  09/15/2022 0939   Hepatic Function Panel     Component Value Date/Time   PROT 6.7 09/15/2022 0939   ALBUMIN 4.8 02/12/2023 0000   ALBUMIN 4.3 09/15/2022 0939   AST 19 02/12/2023 0000   ALT 21 02/12/2023 0000   ALT 361 11/23/2017 0000   ALKPHOS 60 02/12/2023 0000   BILITOT 0.5 09/15/2022 0939   BILIDIR 0.1 11/11/2021 0947      Component Value Date/Time   TSH 0.66 02/12/2023 0000   TSH 1.260 05/30/2022 0953   Nutritional Lab Results  Component Value Date   VD25OH 30.6 09/15/2022   VD25OH 33.0 05/30/2022   VD25OH 38.2 11/27/2021     ASSESSMENT AND PLAN  TREATMENT PLAN FOR OBESITY:  Recommended Dietary Goals  Marriana is currently in the action stage of change. As such, her goal is to continue weight management plan. She has agreed to the Category 1 Plan.  Behavioral Intervention  We discussed the following Behavioral Modification Strategies today: increasing lean protein intake, decreasing simple carbohydrates , increasing vegetables, increasing lower glycemic fruits, increasing water intake, work on meal planning and preparation, keeping healthy foods at home, continue to practice mindfulness when eating, and planning for success.  Additional resources provided today: NA  Recommended Physical Activity Goals  Jackson has been advised to work up to 150 minutes of moderate intensity aerobic activity a week and strengthening exercises 2-3 times per week for cardiovascular health, weight loss maintenance and preservation of muscle mass.   She has agreed to Think about ways to increase daily physical activity and overcoming barriers to exercise, Increase physical activity in their day and reduce sedentary time (increase NEAT)., and Work on scheduling and tracking physical activity.    Pharmacotherapy We discussed various medication options to help Kathern with her weight loss efforts and we both agreed to increase Zepbound to 5mg .  Side effects discussed.  Patient has an IUD.     ASSOCIATED CONDITIONS ADDRESSED TODAY  Action/Plan  Abnormal craving -     Increase Zepbound; Inject 5 mg into the skin once a week.  Dispense: 2 mL; Refill: 0.  Side effects discussed.   Generalized obesity -     Zepbound; Inject 5 mg into the skin once a week.  Dispense: 2 mL; Refill: 0  BMI 30.0-30.9,adult -     Zepbound; Inject 5 mg into the skin once a week.  Dispense: 2 mL; Refill: 0         Return in about 4 weeks (around 04/06/2023).Marland Kitchen She was informed of the importance of frequent follow up visits to maximize her success with  intensive lifestyle modifications for her multiple health conditions.   ATTESTASTION STATEMENTS:  Reviewed by clinician on day of visit: allergies, medications, problem list, medical history, surgical history, family history, social history, and previous encounter notes.     Theodis Sato. [place]oga FNP-C

## 2023-03-10 ENCOUNTER — Encounter: Payer: Self-pay | Admitting: Nurse Practitioner

## 2023-03-10 DIAGNOSIS — Z6830 Body mass index (BMI) 30.0-30.9, adult: Secondary | ICD-10-CM

## 2023-03-10 DIAGNOSIS — E669 Obesity, unspecified: Secondary | ICD-10-CM

## 2023-03-10 DIAGNOSIS — R638 Other symptoms and signs concerning food and fluid intake: Secondary | ICD-10-CM

## 2023-03-10 MED ORDER — ZEPBOUND 5 MG/0.5ML ~~LOC~~ SOAJ
5.0000 mg | SUBCUTANEOUS | 0 refills | Status: DC
Start: 2023-03-10 — End: 2023-03-30

## 2023-03-30 ENCOUNTER — Encounter: Payer: Self-pay | Admitting: Nurse Practitioner

## 2023-03-30 ENCOUNTER — Ambulatory Visit: Payer: 59 | Admitting: Nurse Practitioner

## 2023-03-30 VITALS — BP 105/71 | HR 70 | Temp 98.2°F | Ht 63.0 in | Wt 166.0 lb

## 2023-03-30 DIAGNOSIS — Z6829 Body mass index (BMI) 29.0-29.9, adult: Secondary | ICD-10-CM

## 2023-03-30 DIAGNOSIS — R638 Other symptoms and signs concerning food and fluid intake: Secondary | ICD-10-CM

## 2023-03-30 DIAGNOSIS — Z6830 Body mass index (BMI) 30.0-30.9, adult: Secondary | ICD-10-CM

## 2023-03-30 DIAGNOSIS — E669 Obesity, unspecified: Secondary | ICD-10-CM

## 2023-03-30 MED ORDER — ZEPBOUND 5 MG/0.5ML ~~LOC~~ SOAJ: 5.0000 mg | SUBCUTANEOUS | 0 refills | Status: DC

## 2023-03-30 NOTE — Progress Notes (Signed)
Office: [phone]  /  Fax: [phone]  WEIGHT SUMMARY AND BIOMETRICS  Weight Lost Since Last Visit: 5lb  No data recorded  Vitals Temp: 98.2 F (36.8 C) BP: 105/71 Pulse Rate: 70 SpO2: 98 %   Anthropometric Measurements Height: 5\' 3"  (1.6 m) Weight: 166 lb (75.3 kg) BMI (Calculated): 29.41 Weight at Last Visit: 171lb Weight Lost Since Last Visit: 5lb Starting Weight: 262lb Total Weight Loss (lbs): 96 lb (43.5 kg)   Body Composition  Body Fat %: 35.1 % Fat Mass (lbs): 58.6 lbs Muscle Mass (lbs): 102.8 lbs Total Body Water (lbs): 73.8 lbs Visceral Fat Rating : 6   Other Clinical Data Fasting: No Labs: No Today's Visit #: 18 Starting Date: 11/27/21     HPI  Chief Complaint: OBESITY  Brandi Kirk is here to discuss her progress with her obesity treatment plan. She is on the the Category 1 Plan and states she is following her eating plan approximately 85 % of the time. She states she is exercising 0 minutes 0 days per week.   Interval History:  Since last office visit she has lost 5 pounds. She is eating 1 meal and multiple protein snacks daily-cottage cheese, yogurt, protein power, fruit, bean salads, deli meat. She continues to struggle with hunger and cravings since switching from Qsymia to Zepbound. She is drinking water daily but not enough.  She is also drinking seltzer water.   Her highest weight was 296 lbs. She is going on vacation to the beach in August.    She is scheduled for hernia surgery Aug 15th.    Pharmacotherapy for weight loss: She is currently taking Zepbound 5mg  for medical weight loss.  Denies side effects.    Previous pharmacotherapy for medical weight loss:  She has tried Phentermine and Qsymia. She stopped Phentermine due to side effects of feeling jittery.   Bariatric surgery:  She has not had bariatric surgery.      PHYSICAL EXAM:  Blood pressure 105/71, pulse 70, temperature 98.2 F (36.8 C), height 5\' 3"  (1.6 m), weight 166  lb (75.3 kg), SpO2 98 %. Body mass index is 29.41 kg/m.  General: She is overweight, cooperative, alert, well developed, and in no acute distress. PSYCH: Has normal mood, affect and thought process.   Extremities: No edema.  Neurologic: No gross sensory or motor deficits. No tremors or fasciculations noted.    DIAGNOSTIC DATA REVIEWED:  BMET    Component Value Date/Time   NA 140 02/12/2023 0000   K 4.5 02/12/2023 0000   CL 104 02/12/2023 0000   CO2 22 02/12/2023 0000   GLUCOSE 94 09/15/2022 0939   GLUCOSE 107 (H) 07/17/2021 0943   BUN 17 02/12/2023 0000   CREATININE 0.8 02/12/2023 0000   CREATININE 0.67 09/15/2022 0939   CREATININE 0.52 11/11/2019 1619   CALCIUM 9.6 02/12/2023 0000   GFRNONAA >60 03/22/2019 2328   GFRAA >60 03/22/2019 2328   Lab Results  Component Value Date   HGBA1C 5.3 05/30/2022   HGBA1C 5.2 11/25/2016   Lab Results  Component Value Date   INSULIN 9.2 11/27/2021   Lab Results  Component Value Date   TSH 0.66 02/12/2023   CBC    Component Value Date/Time   WBC 7.9 02/12/2023 0000   WBC 9.3 08/12/2021 1029   RBC 4.21 02/12/2023 0000   HGB 13.0 02/12/2023 0000   HGB 13.5 05/30/2022 0953   HCT 40 02/12/2023 0000   HCT 39.9 05/30/2022 0953   PLT 267 02/12/2023  0000   PLT 280 05/30/2022 0953   MCV 92 05/30/2022 0953   MCH 31.0 05/30/2022 0953   MCH 35.0 (H) 11/11/2019 1619   MCHC 33.8 05/30/2022 0953   MCHC 33.2 08/12/2021 1029   RDW 12.0 05/30/2022 0953   Iron Studies    Component Value Date/Time   IRON 85 05/30/2022 0953   TIBC 251 05/30/2022 0953   FERRITIN 282 (H) 09/15/2022 0939   IRONPCTSAT 34 05/30/2022 0953   IRONPCTSAT 23 07/17/2021 0943   Lipid Panel     Component Value Date/Time   CHOL 146 02/12/2023 0000   CHOL 164 09/15/2022 0939   TRIG 61 02/12/2023 0000   HDL 40 02/12/2023 0000   HDL 48 09/15/2022 0939   CHOLHDL 4.7 (H) 05/30/2022 0953   CHOLHDL 5 05/02/2020 1022   VLDL 36.4 05/02/2020 1022   LDLCALC 93  02/12/2023 0000   LDLCALC 105 (H) 09/15/2022 0939   Hepatic Function Panel     Component Value Date/Time   PROT 6.7 09/15/2022 0939   ALBUMIN 4.8 02/12/2023 0000   ALBUMIN 4.3 09/15/2022 0939   AST 19 02/12/2023 0000   ALT 21 02/12/2023 0000   ALT 361 11/23/2017 0000   ALKPHOS 60 02/12/2023 0000   BILITOT 0.5 09/15/2022 0939   BILIDIR 0.1 11/11/2021 0947      Component Value Date/Time   TSH 0.66 02/12/2023 0000   TSH 1.260 05/30/2022 0953   Nutritional Lab Results  Component Value Date   VD25OH 30.6 09/15/2022   VD25OH 33.0 05/30/2022   VD25OH 38.2 11/27/2021     ASSESSMENT AND PLAN  TREATMENT PLAN FOR OBESITY:  Recommended Dietary Goals  Brandi Kirk is currently in the action stage of change. As such, her goal is to continue weight management plan. She has agreed to the Category 2 Plan.  Behavioral Intervention  We discussed the following Behavioral Modification Strategies today: increasing lean protein intake, decreasing simple carbohydrates , increasing vegetables, increasing lower glycemic fruits, increasing fiber rich foods, avoiding skipping meals, increasing water intake, continue to practice mindfulness when eating, and planning for success.  Additional resources provided today: NA  Recommended Physical Activity Goals  Brandi Kirk has been advised to work up to 150 minutes of moderate intensity aerobic activity a week and strengthening exercises 2-3 times per week for cardiovascular health, weight loss maintenance and preservation of muscle mass.   She has agreed to Think about ways to increase daily physical activity and overcoming barriers to exercise and Increase physical activity in their day and reduce sedentary time (increase NEAT).   Pharmacotherapy We discussed various medication options to help Brandi Kirk with her weight loss efforts and we both agreed to continue Zepbound 5mg . Side effects discussed.  Patient has an IUD.    To stop Zepbound July 15th-4  weeks prior to surgery.  Patient verbalizes understanding.    ASSOCIATED CONDITIONS ADDRESSED TODAY  Action/Plan  Abnormal craving -     Zepbound; Inject 5 mg into the skin once a week.  Dispense: 2 mL; Refill: 0  BMI 30.0-30.9,adult -     Zepbound; Inject 5 mg into the skin once a week.  Dispense: 2 mL; Refill: 0  Generalized obesity -     Zepbound; Inject 5 mg into the skin once a week.  Dispense: 2 mL; Refill: 0      Last labs 02/12/23   Return in about 4 weeks (around 04/27/2023).Marland Kitchen She was informed of the importance of frequent follow up visits to maximize her success  with intensive lifestyle modifications for her multiple health conditions.   ATTESTASTION STATEMENTS:  Reviewed by clinician on day of visit: allergies, medications, problem list, medical history, surgical history, family history, social history, and previous encounter notes.     Theodis Sato. Anivea Velasques FNP-C

## 2023-04-03 DIAGNOSIS — F411 Generalized anxiety disorder: Secondary | ICD-10-CM | POA: Diagnosis not present

## 2023-04-03 DIAGNOSIS — F3132 Bipolar disorder, current episode depressed, moderate: Secondary | ICD-10-CM | POA: Diagnosis not present

## 2023-04-20 ENCOUNTER — Encounter: Payer: Self-pay | Admitting: Nurse Practitioner

## 2023-04-29 ENCOUNTER — Ambulatory Visit: Payer: 59 | Admitting: Nurse Practitioner

## 2023-05-11 ENCOUNTER — Ambulatory Visit: Payer: 59 | Admitting: Nurse Practitioner

## 2023-05-11 ENCOUNTER — Encounter: Payer: Self-pay | Admitting: Nurse Practitioner

## 2023-05-11 VITALS — BP 107/74 | HR 65 | Temp 98.6°F | Ht 63.0 in | Wt 177.0 lb

## 2023-05-11 DIAGNOSIS — Z6831 Body mass index (BMI) 31.0-31.9, adult: Secondary | ICD-10-CM

## 2023-05-11 DIAGNOSIS — R638 Other symptoms and signs concerning food and fluid intake: Secondary | ICD-10-CM | POA: Diagnosis not present

## 2023-05-11 DIAGNOSIS — E669 Obesity, unspecified: Secondary | ICD-10-CM

## 2023-05-11 NOTE — Patient Instructions (Signed)

## 2023-05-11 NOTE — Progress Notes (Signed)
Office: [phone]  /  Fax: [phone]  WEIGHT SUMMARY AND BIOMETRICS  Weight Lost Since Last Visit: 0lb  Weight Gained Since Last Visit: 11lb   Vitals Temp: 98.6 F (37 C) BP: 107/74 Pulse Rate: 65 SpO2: 100 %   Anthropometric Measurements Height: 5\' 3"  (1.6 m) Weight: 177 lb (80.3 kg) BMI (Calculated): 31.36 Weight at Last Visit: 166lb Weight Lost Since Last Visit: 0lb Weight Gained Since Last Visit: 11lb Starting Weight: 262lb Total Weight Loss (lbs): 85 lb (38.6 kg)   Body Composition  Body Fat %: 39.5 % Fat Mass (lbs): 70.2 lbs Muscle Mass (lbs): 102.2 lbs Total Body Water (lbs): 79 lbs Visceral Fat Rating : 7   Other Clinical Data Fasting: No Labs: No Today's Visit #: 19 Starting Date: 11/27/21     HPI  Chief Complaint: OBESITY  Brandi Kirk is here to discuss her progress with her obesity treatment plan. She is on the the Category 1 Plan and states she is following her eating plan approximately 20 % of the time. She states she is exercising 0 minutes 0 days per week.   Interval History:  Since last office visit she has gained 11 pounds.  She has been under stress and has been stress eating.  She has 17 people staying at her house currently.  She has been eating off track due to trying to feed 17 people.  They are leaving to go home tomorrow  She is struggling with hunger and cravings.     Pharmacotherapy for weight loss: She is not currently taking Zepbound.  She stopped Zepbound 1 month ago due to scheduled hernia surgery this week-05/14/23      Previous pharmacotherapy for medical weight loss:    She has tried Phentermine, Zepbound and Qsymia. She stopped Phentermine due to side effects of feeling jittery.   Bariatric surgery:  Patient has not had bariatric surgery.     PHYSICAL EXAM:  Blood pressure 107/74, pulse 65, temperature 98.6 F (37 C), height 5\' 3"  (1.6 m), weight 177 lb (80.3 kg), SpO2 100%. Body mass index is 31.35  kg/m.  General: She is overweight, cooperative, alert, well developed, and in no acute distress. PSYCH: Has normal mood, affect and thought process.   Extremities: No edema.  Neurologic: No gross sensory or motor deficits. No tremors or fasciculations noted.    DIAGNOSTIC DATA REVIEWED:  BMET    Component Value Date/Time   NA 140 02/12/2023 0000   K 4.5 02/12/2023 0000   CL 104 02/12/2023 0000   CO2 22 02/12/2023 0000   GLUCOSE 94 09/15/2022 0939   GLUCOSE 107 (H) 07/17/2021 0943   BUN 17 02/12/2023 0000   CREATININE 0.8 02/12/2023 0000   CREATININE 0.67 09/15/2022 0939   CREATININE 0.52 11/11/2019 1619   CALCIUM 9.6 02/12/2023 0000   GFRNONAA >60 03/22/2019 2328   GFRAA >60 03/22/2019 2328   Lab Results  Component Value Date   HGBA1C 5.3 05/30/2022   HGBA1C 5.2 11/25/2016   Lab Results  Component Value Date   INSULIN 9.2 11/27/2021   Lab Results  Component Value Date   TSH 0.66 02/12/2023   CBC    Component Value Date/Time   WBC 7.9 02/12/2023 0000   WBC 9.3 08/12/2021 1029   RBC 4.21 02/12/2023 0000   HGB 13.0 02/12/2023 0000   HGB 13.5 05/30/2022 0953   HCT 40 02/12/2023 0000   HCT 39.9 05/30/2022 0953   PLT 267 02/12/2023 0000   PLT 280 05/30/2022  0953   MCV 92 05/30/2022 0953   MCH 31.0 05/30/2022 0953   MCH 35.0 (H) 11/11/2019 1619   MCHC 33.8 05/30/2022 0953   MCHC 33.2 08/12/2021 1029   RDW 12.0 05/30/2022 0953   Iron Studies    Component Value Date/Time   IRON 85 05/30/2022 0953   TIBC 251 05/30/2022 0953   FERRITIN 282 (H) 09/15/2022 0939   IRONPCTSAT 34 05/30/2022 0953   IRONPCTSAT 23 07/17/2021 0943   Lipid Panel     Component Value Date/Time   CHOL 146 02/12/2023 0000   CHOL 164 09/15/2022 0939   TRIG 61 02/12/2023 0000   HDL 40 02/12/2023 0000   HDL 48 09/15/2022 0939   CHOLHDL 4.7 (H) 05/30/2022 0953   CHOLHDL 5 05/02/2020 1022   VLDL 36.4 05/02/2020 1022   LDLCALC 93 02/12/2023 0000   LDLCALC 105 (H) 09/15/2022 0939    Hepatic Function Panel     Component Value Date/Time   PROT 6.7 09/15/2022 0939   ALBUMIN 4.8 02/12/2023 0000   ALBUMIN 4.3 09/15/2022 0939   AST 19 02/12/2023 0000   ALT 21 02/12/2023 0000   ALT 361 11/23/2017 0000   ALKPHOS 60 02/12/2023 0000   BILITOT 0.5 09/15/2022 0939   BILIDIR 0.1 11/11/2021 0947      Component Value Date/Time   TSH 0.66 02/12/2023 0000   TSH 1.260 05/30/2022 0953   Nutritional Lab Results  Component Value Date   VD25OH 30.6 09/15/2022   VD25OH 33.0 05/30/2022   VD25OH 38.2 11/27/2021     ASSESSMENT AND PLAN  TREATMENT PLAN FOR OBESITY:  Recommended Dietary Goals  Charlanne is currently in the action stage of change. As such, her goal is to continue weight management plan. She has agreed to the Category 1 Plan.  Behavioral Intervention  We discussed the following Behavioral Modification Strategies today: increasing lean protein intake, decreasing simple carbohydrates , increasing vegetables, increasing lower glycemic fruits, increasing fiber rich foods, avoiding skipping meals, increasing water intake, continue to practice mindfulness when eating, and planning for success.  Additional resources provided today: NA  Recommended Physical Activity Goals  Hartley has been advised to work up to 150 minutes of moderate intensity aerobic activity a week and strengthening exercises 2-3 times per week for cardiovascular health, weight loss maintenance and preservation of muscle mass.   She has agreed to Think about ways to increase daily physical activity and overcoming barriers to exercise   Pharmacotherapy We discussed various medication options to help Shaquayla with her weight loss efforts and we both agreed to start Victoza off label for weight loss after she recovers from surgery.  Side effects discussed.    ASSOCIATED CONDITIONS ADDRESSED TODAY  Action/Plan  Abnormal craving Will either restart Zepbound or start Victoza off label for weight  loss after her surgery.  Will assess how she is doing at her next visit prior to starting.    Generalized obesity  BMI 31.0-31.9,adult         Return in about 3 weeks (around 06/01/2023).Marland Kitchen She was informed of the importance of frequent follow up visits to maximize her success with intensive lifestyle modifications for her multiple health conditions.   ATTESTASTION STATEMENTS:  Reviewed by clinician on day of visit: allergies, medications, problem list, medical history, surgical history, family history, social history, and previous encounter notes.   Time spent on visit including pre-visit chart review and post-visit care and charting was 30 minutes.    Theodis Sato. Marionette Meskill FNP-C

## 2023-05-14 DIAGNOSIS — K439 Ventral hernia without obstruction or gangrene: Secondary | ICD-10-CM | POA: Diagnosis not present

## 2023-06-04 ENCOUNTER — Ambulatory Visit: Payer: 59 | Admitting: Nurse Practitioner

## 2023-06-04 ENCOUNTER — Encounter: Payer: Self-pay | Admitting: Nurse Practitioner

## 2023-06-04 VITALS — BP 99/62 | HR 77 | Temp 98.2°F | Ht 63.0 in | Wt 183.0 lb

## 2023-06-04 DIAGNOSIS — E669 Obesity, unspecified: Secondary | ICD-10-CM | POA: Diagnosis not present

## 2023-06-04 DIAGNOSIS — Z9889 Other specified postprocedural states: Secondary | ICD-10-CM | POA: Diagnosis not present

## 2023-06-04 DIAGNOSIS — R638 Other symptoms and signs concerning food and fluid intake: Secondary | ICD-10-CM | POA: Diagnosis not present

## 2023-06-04 DIAGNOSIS — Z6832 Body mass index (BMI) 32.0-32.9, adult: Secondary | ICD-10-CM

## 2023-06-04 DIAGNOSIS — Z6830 Body mass index (BMI) 30.0-30.9, adult: Secondary | ICD-10-CM

## 2023-06-04 DIAGNOSIS — Z8719 Personal history of other diseases of the digestive system: Secondary | ICD-10-CM | POA: Diagnosis not present

## 2023-06-04 MED ORDER — ZEPBOUND 5 MG/0.5ML ~~LOC~~ SOAJ
5.0000 mg | SUBCUTANEOUS | 0 refills | Status: DC
Start: 2023-06-04 — End: 2023-06-25

## 2023-06-04 NOTE — Progress Notes (Signed)
Office: [phone]  /  Fax: [phone]  WEIGHT SUMMARY AND BIOMETRICS  Weight Lost Since Last Visit: 0lb  Weight Gained Since Last Visit: 6lb   Vitals Temp: 98.2 F (36.8 C) BP: 99/62 Pulse Rate: 77 SpO2: 98 %   Anthropometric Measurements Height: 5\' 3"  (1.6 m) Weight: 183 lb (83 kg) BMI (Calculated): 32.43 Weight at Last Visit: 177lb Weight Lost Since Last Visit: 0lb Weight Gained Since Last Visit: 6lb Starting Weight: 262lb Total Weight Loss (lbs): 79 lb (35.8 kg)   Body Composition  Body Fat %: 42.2 % Fat Mass (lbs): 77.2 lbs Muscle Mass (lbs): 100.4 lbs Total Body Water (lbs): 79.8 lbs Visceral Fat Rating : 8   Other Clinical Data Fasting: No Labs: No Today's Visit #: 20 Starting Date: 11/27/21     HPI  Chief Complaint: OBESITY  Tihesha is here to discuss her progress with her obesity treatment plan. She is on the the Category 1 Plan and states she is following her eating plan approximately 0 % of the time. She states she is exercising 0 minutes 0 days per week.   Interval History:  Since last office visit she has gained 6 pounds.  She had surgery on 05/14/23 and is doing well. Has been released back to regular activity except for lifting over 20 lbs for the next two weeks.  She is not skipping meals and feels that she is overeating since stopping Zepbound.  She is waking up at night eating.  She is stress eating and was recently started on Prozac 40mg .   Pharmacotherapy for weight loss: She is not currently taking medications  for medical weight loss.   Previous pharmacotherapy for medical weight loss:  She has tried Phentermine, Zepbound and Qsymia. She stopped Phentermine due to side effects of feeling jittery.   Bariatric surgery:  Patient has not had bariatric surgery.      PHYSICAL EXAM:  Blood pressure 99/62, pulse 77, temperature 98.2 F (36.8 C), height 5\' 3"  (1.6 m), weight 183 lb (83 kg), SpO2 98%. Body mass index is 32.42  kg/m.  General: She is overweight, cooperative, alert, well developed, and in no acute distress. PSYCH: Has normal mood, affect and thought process.   Extremities: No edema.  Neurologic: No gross sensory or motor deficits. No tremors or fasciculations noted.    DIAGNOSTIC DATA REVIEWED:  BMET    Component Value Date/Time   NA 140 02/12/2023 0000   K 4.5 02/12/2023 0000   CL 104 02/12/2023 0000   CO2 22 02/12/2023 0000   GLUCOSE 94 09/15/2022 0939   GLUCOSE 107 (H) 07/17/2021 0943   BUN 17 02/12/2023 0000   CREATININE 0.8 02/12/2023 0000   CREATININE 0.67 09/15/2022 0939   CREATININE 0.52 11/11/2019 1619   CALCIUM 9.6 02/12/2023 0000   GFRNONAA >60 03/22/2019 2328   GFRAA >60 03/22/2019 2328   Lab Results  Component Value Date   HGBA1C 5.3 05/30/2022   HGBA1C 5.2 11/25/2016   Lab Results  Component Value Date   INSULIN 9.2 11/27/2021   Lab Results  Component Value Date   TSH 0.66 02/12/2023   CBC    Component Value Date/Time   WBC 7.9 02/12/2023 0000   WBC 9.3 08/12/2021 1029   RBC 4.21 02/12/2023 0000   HGB 13.0 02/12/2023 0000   HGB 13.5 05/30/2022 0953   HCT 40 02/12/2023 0000   HCT 39.9 05/30/2022 0953   PLT 267 02/12/2023 0000   PLT 280 05/30/2022 0953  MCV 92 05/30/2022 0953   MCH 31.0 05/30/2022 0953   MCH 35.0 (H) 11/11/2019 1619   MCHC 33.8 05/30/2022 0953   MCHC 33.2 08/12/2021 1029   RDW 12.0 05/30/2022 0953   Iron Studies    Component Value Date/Time   IRON 85 05/30/2022 0953   TIBC 251 05/30/2022 0953   FERRITIN 282 (H) 09/15/2022 0939   IRONPCTSAT 34 05/30/2022 0953   IRONPCTSAT 23 07/17/2021 0943   Lipid Panel     Component Value Date/Time   CHOL 146 02/12/2023 0000   CHOL 164 09/15/2022 0939   TRIG 61 02/12/2023 0000   HDL 40 02/12/2023 0000   HDL 48 09/15/2022 0939   CHOLHDL 4.7 (H) 05/30/2022 0953   CHOLHDL 5 05/02/2020 1022   VLDL 36.4 05/02/2020 1022   LDLCALC 93 02/12/2023 0000   LDLCALC 105 (H) 09/15/2022 0939    Hepatic Function Panel     Component Value Date/Time   PROT 6.7 09/15/2022 0939   ALBUMIN 4.8 02/12/2023 0000   ALBUMIN 4.3 09/15/2022 0939   AST 19 02/12/2023 0000   ALT 21 02/12/2023 0000   ALT 361 11/23/2017 0000   ALKPHOS 60 02/12/2023 0000   BILITOT 0.5 09/15/2022 0939   BILIDIR 0.1 11/11/2021 0947      Component Value Date/Time   TSH 0.66 02/12/2023 0000   TSH 1.260 05/30/2022 0953   Nutritional Lab Results  Component Value Date   VD25OH 30.6 09/15/2022   VD25OH 33.0 05/30/2022   VD25OH 38.2 11/27/2021     ASSESSMENT AND PLAN  TREATMENT PLAN FOR OBESITY:  Recommended Dietary Goals  Verner is currently in the action stage of change. As such, her goal is to continue weight management plan. She has agreed to the Category 1 Plan.  Behavioral Intervention  We discussed the following Behavioral Modification Strategies today: increasing lean protein intake, decreasing simple carbohydrates , increasing vegetables, increasing lower glycemic fruits, increasing fiber rich foods, avoiding skipping meals, increasing water intake, reading food labels , keeping healthy foods at home, continue to practice mindfulness when eating, and planning for success.  Additional resources provided today: NA  Recommended Physical Activity Goals  Cari has been advised to work up to 150 minutes of moderate intensity aerobic activity a week and strengthening exercises 2-3 times per week for cardiovascular health, weight loss maintenance and preservation of muscle mass.   She has agreed to Think about ways to increase daily physical activity and overcoming barriers to exercise and Increase physical activity in their day and reduce sedentary time (increase NEAT).   Pharmacotherapy We discussed various medication options to help Merrin with her weight loss efforts and we both agreed to [employer] .  Side effects discussed.  Patient has an IUD.  Recommended 2 forms of birth control.     ASSOCIATED CONDITIONS ADDRESSED TODAY  Action/Plan  Abnormal craving -     Zepbound; Inject 5 mg into the skin once a week.  Dispense: 2 mL; Refill: 0  BMI 30.0-30.9,adult  Generalized obesity -     Zepbound; Inject 5 mg into the skin once a week.  Dispense: 2 mL; Refill: 0         Return in about 4 weeks (around 07/02/2023).Marland Kitchen She was informed of the importance of frequent follow up visits to maximize her success with intensive lifestyle modifications for her multiple health conditions.   ATTESTASTION STATEMENTS:  Reviewed by clinician on day of visit: allergies, medications, problem list, medical history, surgical history, family history, social  history, and previous encounter notes.     Theodis Sato. Huel Centola FNP-C

## 2023-06-04 NOTE — Patient Instructions (Signed)

## 2023-06-22 DIAGNOSIS — F411 Generalized anxiety disorder: Secondary | ICD-10-CM | POA: Diagnosis not present

## 2023-06-22 DIAGNOSIS — F3132 Bipolar disorder, current episode depressed, moderate: Secondary | ICD-10-CM | POA: Diagnosis not present

## 2023-06-23 ENCOUNTER — Encounter: Payer: Self-pay | Admitting: Nurse Practitioner

## 2023-06-25 ENCOUNTER — Encounter: Payer: Self-pay | Admitting: Nurse Practitioner

## 2023-06-25 ENCOUNTER — Ambulatory Visit: Payer: 59 | Admitting: Nurse Practitioner

## 2023-06-25 VITALS — BP 111/73 | HR 70 | Temp 98.5°F | Ht 63.0 in | Wt 172.0 lb

## 2023-06-25 DIAGNOSIS — Z6830 Body mass index (BMI) 30.0-30.9, adult: Secondary | ICD-10-CM

## 2023-06-25 DIAGNOSIS — Z79899 Other long term (current) drug therapy: Secondary | ICD-10-CM | POA: Diagnosis not present

## 2023-06-25 DIAGNOSIS — E65 Localized adiposity: Secondary | ICD-10-CM | POA: Diagnosis not present

## 2023-06-25 DIAGNOSIS — R5383 Other fatigue: Secondary | ICD-10-CM

## 2023-06-25 DIAGNOSIS — E559 Vitamin D deficiency, unspecified: Secondary | ICD-10-CM | POA: Diagnosis not present

## 2023-06-25 DIAGNOSIS — E669 Obesity, unspecified: Secondary | ICD-10-CM

## 2023-06-25 DIAGNOSIS — R638 Other symptoms and signs concerning food and fluid intake: Secondary | ICD-10-CM

## 2023-06-25 DIAGNOSIS — Z862 Personal history of diseases of the blood and blood-forming organs and certain disorders involving the immune mechanism: Secondary | ICD-10-CM

## 2023-06-25 MED ORDER — ZEPBOUND 5 MG/0.5ML ~~LOC~~ SOAJ
5.0000 mg | SUBCUTANEOUS | 0 refills | Status: DC
Start: 2023-06-25 — End: 2023-09-22

## 2023-06-25 NOTE — Progress Notes (Signed)
Office: [phone]  /  Fax: [phone]  WEIGHT SUMMARY AND BIOMETRICS  Weight Lost Since Last Visit: 9lb  Weight Gained Since Last Visit: 0lb   Vitals Temp: 98.5 F (36.9 C) BP: 111/73 Pulse Rate: 70 SpO2: 100 %   Anthropometric Measurements Height: 5\' 3"  (1.6 m) Weight: 172 lb (78 kg) BMI (Calculated): 30.48 Weight at Last Visit: 183lb Weight Lost Since Last Visit: 9lb Weight Gained Since Last Visit: 0lb Starting Weight: 262lb Total Weight Loss (lbs): 88 lb (39.9 kg)   Body Composition  Body Fat %: 39 % Fat Mass (lbs): 67.4 lbs Muscle Mass (lbs): 100 lbs Total Body Water (lbs): 75.6 lbs Visceral Fat Rating : 7   Other Clinical Data Fasting: No Labs: No Today's Visit #: 21 Starting Date: 11/27/21     HPI  Chief Complaint: OBESITY  Brandi Kirk is here to discuss her progress with her obesity treatment plan. She is on the the Category 1 Plan and states she is following her eating plan approximately 75 % of the time. She states she is exercising 0 minutes 0 days per week.   Interval History:  Since last office visit she has lost 9 pounds. She has been under stress since her last visit.  She is starting a new job next week.  Notes some fatigue.  Has a PMH of anemia, vit D def, Vit B12 def, elevated ferritin.  She is taking a MV.  Not taking iron.    Pharmacotherapy for weight loss: She is currently taking Zepbound 5mg  for medical weight loss.  Denies side effects.    Previous pharmacotherapy for medical weight loss:   She has tried Phentermine, Zepbound and Qsymia. She stopped Phentermine due to side effects of feeling jittery.   Bariatric surgery:  has not had bariatric surgery  Pannus, abdominal Brandi Kirk is continuing to struggle with redness/yeast under her abdominal pannus. She is using over the counter medications. Has seen GYN and has also used ketoconazole topical cream in the past.   PHYSICAL EXAM:  Blood pressure 111/73, pulse 70, temperature  98.5 F (36.9 C), height 5\' 3"  (1.6 m), weight 172 lb (78 kg), SpO2 100%. Body mass index is 30.47 kg/m.  General: She is overweight, cooperative, alert, well developed, and in no acute distress. PSYCH: Has normal mood, affect and thought process.   Extremities: No edema.  Neurologic: No gross sensory or motor deficits. No tremors or fasciculations noted.    DIAGNOSTIC DATA REVIEWED:  BMET    Component Value Date/Time   NA 140 02/12/2023 0000   K 4.5 02/12/2023 0000   CL 104 02/12/2023 0000   CO2 22 02/12/2023 0000   GLUCOSE 94 09/15/2022 0939   GLUCOSE 107 (H) 07/17/2021 0943   BUN 17 02/12/2023 0000   CREATININE 0.8 02/12/2023 0000   CREATININE 0.67 09/15/2022 0939   CREATININE 0.52 11/11/2019 1619   CALCIUM 9.6 02/12/2023 0000   GFRNONAA >60 03/22/2019 2328   GFRAA >60 03/22/2019 2328   Lab Results  Component Value Date   HGBA1C 5.3 05/30/2022   HGBA1C 5.2 11/25/2016   Lab Results  Component Value Date   INSULIN 9.2 11/27/2021   Lab Results  Component Value Date   TSH 0.66 02/12/2023   CBC    Component Value Date/Time   WBC 7.9 02/12/2023 0000   WBC 9.3 08/12/2021 1029   RBC 4.21 02/12/2023 0000   HGB 13.0 02/12/2023 0000   HGB 13.5 05/30/2022 0953   HCT 40 02/12/2023 0000  HCT 39.9 05/30/2022 0953   PLT 267 02/12/2023 0000   PLT 280 05/30/2022 0953   MCV 92 05/30/2022 0953   MCH 31.0 05/30/2022 0953   MCH 35.0 (H) 11/11/2019 1619   MCHC 33.8 05/30/2022 0953   MCHC 33.2 08/12/2021 1029   RDW 12.0 05/30/2022 0953   Iron Studies    Component Value Date/Time   IRON 85 05/30/2022 0953   TIBC 251 05/30/2022 0953   FERRITIN 282 (H) 09/15/2022 0939   IRONPCTSAT 34 05/30/2022 0953   IRONPCTSAT 23 07/17/2021 0943   Lipid Panel     Component Value Date/Time   CHOL 146 02/12/2023 0000   CHOL 164 09/15/2022 0939   TRIG 61 02/12/2023 0000   HDL 40 02/12/2023 0000   HDL 48 09/15/2022 0939   CHOLHDL 4.7 (H) 05/30/2022 0953   CHOLHDL 5 05/02/2020  1022   VLDL 36.4 05/02/2020 1022   LDLCALC 93 02/12/2023 0000   LDLCALC 105 (H) 09/15/2022 0939   Hepatic Function Panel     Component Value Date/Time   PROT 6.7 09/15/2022 0939   ALBUMIN 4.8 02/12/2023 0000   ALBUMIN 4.3 09/15/2022 0939   AST 19 02/12/2023 0000   ALT 21 02/12/2023 0000   ALT 361 11/23/2017 0000   ALKPHOS 60 02/12/2023 0000   BILITOT 0.5 09/15/2022 0939   BILIDIR 0.1 11/11/2021 0947      Component Value Date/Time   TSH 0.66 02/12/2023 0000   TSH 1.260 05/30/2022 0953   Nutritional Lab Results  Component Value Date   VD25OH 30.6 09/15/2022   VD25OH 33.0 05/30/2022   VD25OH 38.2 11/27/2021     ASSESSMENT AND PLAN  TREATMENT PLAN FOR OBESITY:  Recommended Dietary Goals  Brandi Kirk is currently in the action stage of change. As such, her goal is to continue weight management plan. She has agreed to the Category 1 Plan.  Behavioral Intervention  We discussed the following Behavioral Modification Strategies today: increasing lean protein intake, decreasing simple carbohydrates , increasing vegetables, increasing lower glycemic fruits, increasing fiber rich foods, avoiding skipping meals, increasing water intake, work on meal planning and preparation, work on Counselling psychologist calories using tracking application, reading food labels , keeping healthy foods at home, continue to practice mindfulness when eating, and planning for success.  Additional resources provided today: NA  Recommended Physical Activity Goals  Brandi Kirk has been advised to work up to 150 minutes of moderate intensity aerobic activity a week and strengthening exercises 2-3 times per week for cardiovascular health, weight loss maintenance and preservation of muscle mass.   She has agreed to Think about ways to increase daily physical activity and overcoming barriers to exercise, Increase physical activity in their day and reduce sedentary time (increase NEAT)., and Work on scheduling and  tracking physical activity.    Pharmacotherapy We discussed various medication options to help Brandi Kirk with her weight loss efforts and we both agreed to continue Zepbound 5mg .  Will consider decreasing to 2.5mg  at next visit based upon weight loss.  ASSOCIATED CONDITIONS ADDRESSED TODAY  Action/Plan  Vitamin D deficiency -     VITAMIN D 25 Hydroxy (Vit-D Deficiency, Fractures)  Pannus, abdominal Continue to follow up with PCP and GYN.  Abnormal craving -     Zepbound; Inject 5 mg into the skin once a week.  Dispense: 2 mL; Refill: 0  History of anemia -     Vitamin B12 -     CBC with Differential/Platelet -     Ferritin -  Iron  Other fatigue -     Vitamin B12 -     CBC with Differential/Platelet -     Ferritin -     Iron  Medication management -     Comprehensive metabolic panel  Generalized obesity -     Zepbound; Inject 5 mg into the skin once a week.  Dispense: 2 mL; Refill: 0  BMI 30.0-30.9,adult         Return in about 4 weeks (around 07/23/2023).Marland Kitchen She was informed of the importance of frequent follow up visits to maximize her success with intensive lifestyle modifications for her multiple health conditions.   ATTESTASTION STATEMENTS:  Reviewed by clinician on day of visit: allergies, medications, problem list, medical history, surgical history, family history, social history, and previous encounter notes.     Theodis Sato. Dessirae Scarola FNP-C

## 2023-06-26 LAB — COMPREHENSIVE METABOLIC PANEL
ALT: 21 [IU]/L (ref 0–32)
AST: 16 [IU]/L (ref 0–40)
Albumin: 4.8 g/dL (ref 3.9–4.9)
Alkaline Phosphatase: 59 [IU]/L (ref 44–121)
BUN/Creatinine Ratio: 28 — ABNORMAL HIGH (ref 9–23)
BUN: 17 mg/dL (ref 6–20)
Bilirubin Total: 1.1 mg/dL (ref 0.0–1.2)
CO2: 21 mmol/L (ref 20–29)
Calcium: 9.4 mg/dL (ref 8.7–10.2)
Chloride: 104 mmol/L (ref 96–106)
Creatinine, Ser: 0.61 mg/dL (ref 0.57–1.00)
Globulin, Total: 2.4 g/dL (ref 1.5–4.5)
Glucose: 88 mg/dL (ref 70–99)
Potassium: 4.8 mmol/L (ref 3.5–5.2)
Sodium: 140 mmol/L (ref 134–144)
Total Protein: 7.2 g/dL (ref 6.0–8.5)
eGFR: 119 mL/min/{1.73_m2} (ref >59)

## 2023-06-26 LAB — IRON: Iron: 117 ug/dL (ref 27–159)

## 2023-06-26 LAB — CBC WITH DIFFERENTIAL/PLATELET
Basophils Absolute: 0 10*3/uL (ref 0.0–0.2)
Basos: 0 %
EOS (ABSOLUTE): 0.1 10*3/uL (ref 0.0–0.4)
Eos: 2 %
Hematocrit: 42.2 % (ref 34.0–46.6)
Hemoglobin: 13.6 g/dL (ref 11.1–15.9)
Immature Grans (Abs): 0 10*3/uL (ref 0.0–0.1)
Immature Granulocytes: 0 %
Lymphocytes Absolute: 2.1 10*3/uL (ref 0.7–3.1)
Lymphs: 38 %
MCH: 30.5 pg (ref 26.6–33.0)
MCHC: 32.2 g/dL (ref 31.5–35.7)
MCV: 95 fL (ref 79–97)
Monocytes Absolute: 0.4 10*3/uL (ref 0.1–0.9)
Monocytes: 7 %
Neutrophils Absolute: 2.9 10*3/uL (ref 1.4–7.0)
Neutrophils: 53 %
Platelets: 261 10*3/uL (ref 150–450)
RBC: 4.46 x10E6/uL (ref 3.77–5.28)
RDW: 11.4 % — ABNORMAL LOW (ref 11.7–15.4)
WBC: 5.5 10*3/uL (ref 3.4–10.8)

## 2023-06-26 LAB — VITAMIN B12: Vitamin B-12: 925 pg/mL (ref 232–1245)

## 2023-06-26 LAB — FERRITIN: Ferritin: 363 ng/mL — ABNORMAL HIGH (ref 15–150)

## 2023-06-26 LAB — VITAMIN D 25 HYDROXY (VIT D DEFICIENCY, FRACTURES): Vit D, 25-Hydroxy: 38.3 ng/mL (ref 30.0–100.0)

## 2023-06-29 ENCOUNTER — Ambulatory Visit: Payer: 59 | Admitting: Nurse Practitioner

## 2023-07-10 DIAGNOSIS — F411 Generalized anxiety disorder: Secondary | ICD-10-CM | POA: Diagnosis not present

## 2023-07-10 DIAGNOSIS — F3132 Bipolar disorder, current episode depressed, moderate: Secondary | ICD-10-CM | POA: Diagnosis not present

## 2023-07-23 ENCOUNTER — Encounter: Payer: Self-pay | Admitting: Nurse Practitioner

## 2023-07-23 ENCOUNTER — Ambulatory Visit: Payer: 59 | Admitting: Nurse Practitioner

## 2023-07-23 ENCOUNTER — Other Ambulatory Visit: Payer: Self-pay | Admitting: Nurse Practitioner

## 2023-07-23 VITALS — BP 101/70 | HR 88 | Temp 98.0°F | Ht 63.0 in | Wt 172.0 lb

## 2023-07-23 DIAGNOSIS — R632 Polyphagia: Secondary | ICD-10-CM | POA: Diagnosis not present

## 2023-07-23 DIAGNOSIS — E669 Obesity, unspecified: Secondary | ICD-10-CM

## 2023-07-23 DIAGNOSIS — R638 Other symptoms and signs concerning food and fluid intake: Secondary | ICD-10-CM

## 2023-07-23 DIAGNOSIS — Z6830 Body mass index (BMI) 30.0-30.9, adult: Secondary | ICD-10-CM

## 2023-07-23 MED ORDER — TOPIRAMATE 50 MG PO TABS: ORAL_TABLET | ORAL | 0 refills | Status: DC

## 2023-07-23 MED ORDER — ZEPBOUND 7.5 MG/0.5ML ~~LOC~~ SOAJ
7.5000 mg | SUBCUTANEOUS | 0 refills | Status: DC
Start: 2023-07-23 — End: 2023-08-24

## 2023-07-23 NOTE — Progress Notes (Signed)
Office: [phone]  /  Fax: [phone]  WEIGHT SUMMARY AND BIOMETRICS  Weight Lost Since Last Visit: 0lb  Weight Gained Since Last Visit: 0lb   Vitals Temp: 98 F (36.7 C) BP: 101/70 Pulse Rate: 88 SpO2: 96 %   Anthropometric Measurements Height: 5\' 3"  (1.6 m) Weight: 172 lb (78 kg) BMI (Calculated): 30.48 Weight at Last Visit: 172lb Weight Lost Since Last Visit: 0lb Weight Gained Since Last Visit: 0lb Starting Weight: 262lb Total Weight Loss (lbs): 88 lb (39.9 kg)   Body Composition  Body Fat %: 39.6 % Fat Mass (lbs): 68.4 lbs Muscle Mass (lbs): 99.2 lbs Total Body Water (lbs): 77.2 lbs Visceral Fat Rating : 7   Other Clinical Data Fasting: Yes Labs: No Today's Visit #: 22 Starting Date: 11/27/21     HPI  Chief Complaint: OBESITY  Kaylene is here to discuss her progress with her obesity treatment plan. She is on the the Category 1 Plan and states she is following her eating plan approximately 85 % of the time. She states she is exercising 0 minutes 0 days per week.   Interval History:  Since last office visit she has maintained her weight.  She has overall done well with weight loss.  She is struggling with polyphagia and cravings.  She is aiming to eat more protein.  She is snacking on 1 cup of grapes and blueberries. She feels she might be eating too mary carbs.  She is drinking a protein shake, 3 cups of coffee, diet soda and water daily.  She is considering hiring a Systems analyst.    Pharmacotherapy for weight loss: She is currently taking Zepbound 5mg  for medical weight loss.  Denies side effects.     Previous pharmacotherapy for medical weight loss:   She has tried Phentermine, Zepbound and Qsymia. She stopped Phentermine due to side effects of feeling jittery.    Bariatric surgery:  Has not had bariatric surgery  PHYSICAL EXAM:  Blood pressure 101/70, pulse 88, temperature 98 F (36.7 C), height 5\' 3"  (1.6 m), weight 172 lb (78 kg),  SpO2 96%. Body mass index is 30.47 kg/m.  General: She is overweight, cooperative, alert, well developed, and in no acute distress. PSYCH: Has normal mood, affect and thought process.   Extremities: No edema.  Neurologic: No gross sensory or motor deficits. No tremors or fasciculations noted.    DIAGNOSTIC DATA REVIEWED:  BMET    Component Value Date/Time   NA 140 06/25/2023 1127   K 4.8 06/25/2023 1127   CL 104 06/25/2023 1127   CO2 21 06/25/2023 1127   GLUCOSE 88 06/25/2023 1127   GLUCOSE 107 (H) 07/17/2021 0943   BUN 17 06/25/2023 1127   CREATININE 0.61 06/25/2023 1127   CREATININE 0.52 11/11/2019 1619   CALCIUM 9.4 06/25/2023 1127   GFRNONAA >60 03/22/2019 2328   GFRAA >60 03/22/2019 2328   Lab Results  Component Value Date   HGBA1C 5.3 05/30/2022   HGBA1C 5.2 11/25/2016   Lab Results  Component Value Date   INSULIN 9.2 11/27/2021   Lab Results  Component Value Date   TSH 0.66 02/12/2023   CBC    Component Value Date/Time   WBC 5.5 06/25/2023 1127   WBC 9.3 08/12/2021 1029   RBC 4.46 06/25/2023 1127   RBC 4.21 02/12/2023 0000   HGB 13.6 06/25/2023 1127   HCT 42.2 06/25/2023 1127   PLT 261 06/25/2023 1127   MCV 95 06/25/2023 1127   MCH 30.5 06/25/2023  1127   MCH 35.0 (H) 11/11/2019 1619   MCHC 32.2 06/25/2023 1127   MCHC 33.2 08/12/2021 1029   RDW 11.4 (L) 06/25/2023 1127   Iron Studies    Component Value Date/Time   IRON 117 06/25/2023 1127   TIBC 251 05/30/2022 0953   FERRITIN 363 (H) 06/25/2023 1127   IRONPCTSAT 34 05/30/2022 0953   IRONPCTSAT 23 07/17/2021 0943   Lipid Panel     Component Value Date/Time   CHOL 146 02/12/2023 0000   CHOL 164 09/15/2022 0939   TRIG 61 02/12/2023 0000   HDL 40 02/12/2023 0000   HDL 48 09/15/2022 0939   CHOLHDL 4.7 (H) 05/30/2022 0953   CHOLHDL 5 05/02/2020 1022   VLDL 36.4 05/02/2020 1022   LDLCALC 93 02/12/2023 0000   LDLCALC 105 (H) 09/15/2022 0939   Hepatic Function Panel     Component Value  Date/Time   PROT 7.2 06/25/2023 1127   ALBUMIN 4.8 06/25/2023 1127   AST 16 06/25/2023 1127   ALT 21 06/25/2023 1127   ALT 361 11/23/2017 0000   ALKPHOS 59 06/25/2023 1127   BILITOT 1.1 06/25/2023 1127   BILIDIR 0.1 11/11/2021 0947      Component Value Date/Time   TSH 0.66 02/12/2023 0000   TSH 1.260 05/30/2022 0953   Nutritional Lab Results  Component Value Date   VD25OH 38.3 06/25/2023   VD25OH 30.6 09/15/2022   VD25OH 33.0 05/30/2022     ASSESSMENT AND PLAN  TREATMENT PLAN FOR OBESITY:  Recommended Dietary Goals  Aliki is currently in the action stage of change. As such, her goal is to continue weight management plan. She has agreed to the Category 1 Plan.  Behavioral Intervention  We discussed the following Behavioral Modification Strategies today: continue to work on maintaining a reduced calorie state, getting the recommended amount of protein, incorporating whole foods, making healthy choices, staying well hydrated and practicing mindfulness when eating..  Additional resources provided today: NA  Recommended Physical Activity Goals  Anjelica has been advised to work up to 150 minutes of moderate intensity aerobic activity a week and strengthening exercises 2-3 times per week for cardiovascular health, weight loss maintenance and preservation of muscle mass.   She has agreed to Think about enjoyable ways to increase daily physical activity and overcoming barriers to exercise, Increase physical activity in their day and reduce sedentary time (increase NEAT)., and Work on scheduling and tracking physical activity.    Pharmacotherapy We discussed various medication options to help Noriko with her weight loss efforts and we both agreed to increase Zepbound 7.5mg  and start Topamax for cravings.  Side effects discussed.  Patient has an IUD.  Discussed using 2 birth control options.    ASSOCIATED CONDITIONS ADDRESSED TODAY  Action/Plan  Polyphagia -    Increase  Zepbound; Inject 7.5 mg into the skin once a week.  Dispense: 2 mL; Refill: 0  Abnormal craving -     Start Topiramate; Take 1/2 tablet po daily for one week and then increase to a full pill  Dispense: 30 tablet; Refill: 0  Generalized obesity -     Zepbound; Inject 7.5 mg into the skin once a week.  Dispense: 2 mL; Refill: 0  BMI 30.0-30.9,adult     Labs reviewed in chart from 06/25/23    Return in about 4 weeks (around 08/20/2023).Marland Kitchen She was informed of the importance of frequent follow up visits to maximize her success with intensive lifestyle modifications for her multiple health conditions.  ATTESTASTION STATEMENTS:  Reviewed by clinician on day of visit: allergies, medications, problem list, medical history, surgical history, family history, social history, and previous encounter notes.     Theodis Sato. Airrion Otting FNP-C

## 2023-08-06 ENCOUNTER — Encounter: Payer: Self-pay | Admitting: Nurse Practitioner

## 2023-08-15 ENCOUNTER — Other Ambulatory Visit: Payer: Self-pay | Admitting: Nurse Practitioner

## 2023-08-15 DIAGNOSIS — R638 Other symptoms and signs concerning food and fluid intake: Secondary | ICD-10-CM

## 2023-08-24 ENCOUNTER — Ambulatory Visit: Payer: 59 | Admitting: Nurse Practitioner

## 2023-08-24 ENCOUNTER — Encounter: Payer: Self-pay | Admitting: Nurse Practitioner

## 2023-08-24 VITALS — BP 108/69 | HR 84 | Temp 97.9°F | Ht 63.0 in | Wt 164.0 lb

## 2023-08-24 DIAGNOSIS — R638 Other symptoms and signs concerning food and fluid intake: Secondary | ICD-10-CM | POA: Diagnosis not present

## 2023-08-24 DIAGNOSIS — Z6829 Body mass index (BMI) 29.0-29.9, adult: Secondary | ICD-10-CM

## 2023-08-24 DIAGNOSIS — R0602 Shortness of breath: Secondary | ICD-10-CM

## 2023-08-24 DIAGNOSIS — E669 Obesity, unspecified: Secondary | ICD-10-CM

## 2023-08-24 DIAGNOSIS — R632 Polyphagia: Secondary | ICD-10-CM

## 2023-08-24 MED ORDER — ZEPBOUND 7.5 MG/0.5ML ~~LOC~~ SOAJ: 7.5000 mg | SUBCUTANEOUS | 0 refills | Status: DC

## 2023-08-24 MED ORDER — TOPIRAMATE 50 MG PO TABS: ORAL_TABLET | ORAL | 0 refills | Status: DC

## 2023-08-24 NOTE — Progress Notes (Signed)
Office: [phone]  /  Fax: [phone]  WEIGHT SUMMARY AND BIOMETRICS  Weight Lost Since Last Visit: 8 lb  Weight Gained Since Last Visit: 0   Vitals Temp: 97.9 F (36.6 C) BP: 108/69 Pulse Rate: 84 SpO2: 99 %   Anthropometric Measurements Height: 5\' 3"  (1.6 m) Weight: 164 lb (74.4 kg) BMI (Calculated): 29.06 Weight at Last Visit: 172 lb Weight Lost Since Last Visit: 8 lb Weight Gained Since Last Visit: 0 Starting Weight: 262 lb Total Weight Loss (lbs): 96 lb (43.5 kg)   Body Composition  Body Fat %: 37.1 % Fat Mass (lbs): 60.8 lbs Muscle Mass (lbs): 98 lbs Total Body Water (lbs): 72.8 lbs Visceral Fat Rating : 6   Other Clinical Data Fasting: no Labs: no Today's Visit #: 23 Starting Date: 11/27/21     HPI  Chief Complaint: OBESITY  Brandi Kirk is here to discuss her progress with her obesity treatment plan. She is on the the Category 1 Plan and states she is following her eating plan approximately 85-90 % of the time. She states she is exercising 0 minutes 0 days per week.   Interval History:  Since last office visit she has lost 8 pounds.  She is doing better with eating more protein.  She is eating smaller meals several times per day.  She is drinking water and a protein shake daily. She has been reducing caffeine.  She continues to struggles with cravings especially sugar.   Pharmacotherapy for weight loss: She is currently taking Zepbound 7.5mg  for medical weight loss and Topamax 50mg  for cravings.  Denies side effects.     Previous pharmacotherapy for medical weight loss:   She has tried Phentermine, Zepbound and Qsymia. She stopped Phentermine due to side effects of feeling jittery.    Bariatric surgery:  Has not had bariatric surgery  Vaping She has been vaping for the past 2 years and has been vaping more.  Notes under stress and feels that it has increased as her stress has increased.  Notes now she is having SHOB that has been getting  worse and struggles with breathing with sitting and with activity.    PHYSICAL EXAM:  Blood pressure 108/69, pulse 84, temperature 97.9 F (36.6 C), height 5\' 3"  (1.6 m), weight 164 lb (74.4 kg), SpO2 99%. Body mass index is 29.05 kg/m.  General: She is overweight, cooperative, alert, well developed, and in no acute distress. PSYCH: Has normal mood, affect and thought process.   Extremities: No edema.  Neurologic: No gross sensory or motor deficits. No tremors or fasciculations noted.    DIAGNOSTIC DATA REVIEWED:  BMET    Component Value Date/Time   NA 140 06/25/2023 1127   K 4.8 06/25/2023 1127   CL 104 06/25/2023 1127   CO2 21 06/25/2023 1127   GLUCOSE 88 06/25/2023 1127   GLUCOSE 107 (H) 07/17/2021 0943   BUN 17 06/25/2023 1127   CREATININE 0.61 06/25/2023 1127   CREATININE 0.52 11/11/2019 1619   CALCIUM 9.4 06/25/2023 1127   GFRNONAA >60 03/22/2019 2328   GFRAA >60 03/22/2019 2328   Lab Results  Component Value Date   HGBA1C 5.3 05/30/2022   HGBA1C 5.2 11/25/2016   Lab Results  Component Value Date   INSULIN 9.2 11/27/2021   Lab Results  Component Value Date   TSH 0.66 02/12/2023   CBC    Component Value Date/Time   WBC 5.5 06/25/2023 1127   WBC 9.3 08/12/2021 1029   RBC 4.46 06/25/2023  1127   RBC 4.21 02/12/2023 0000   HGB 13.6 06/25/2023 1127   HCT 42.2 06/25/2023 1127   PLT 261 06/25/2023 1127   MCV 95 06/25/2023 1127   MCH 30.5 06/25/2023 1127   MCH 35.0 (H) 11/11/2019 1619   MCHC 32.2 06/25/2023 1127   MCHC 33.2 08/12/2021 1029   RDW 11.4 (L) 06/25/2023 1127   Iron Studies    Component Value Date/Time   IRON 117 06/25/2023 1127   TIBC 251 05/30/2022 0953   FERRITIN 363 (H) 06/25/2023 1127   IRONPCTSAT 34 05/30/2022 0953   IRONPCTSAT 23 07/17/2021 0943   Lipid Panel     Component Value Date/Time   CHOL 146 02/12/2023 0000   CHOL 164 09/15/2022 0939   TRIG 61 02/12/2023 0000   HDL 40 02/12/2023 0000   HDL 48 09/15/2022 0939    CHOLHDL 4.7 (H) 05/30/2022 0953   CHOLHDL 5 05/02/2020 1022   VLDL 36.4 05/02/2020 1022   LDLCALC 93 02/12/2023 0000   LDLCALC 105 (H) 09/15/2022 0939   Hepatic Function Panel     Component Value Date/Time   PROT 7.2 06/25/2023 1127   ALBUMIN 4.8 06/25/2023 1127   AST 16 06/25/2023 1127   ALT 21 06/25/2023 1127   ALT 361 11/23/2017 0000   ALKPHOS 59 06/25/2023 1127   BILITOT 1.1 06/25/2023 1127   BILIDIR 0.1 11/11/2021 0947      Component Value Date/Time   TSH 0.66 02/12/2023 0000   TSH 1.260 05/30/2022 0953   Nutritional Lab Results  Component Value Date   VD25OH 38.3 06/25/2023   VD25OH 30.6 09/15/2022   VD25OH 33.0 05/30/2022     ASSESSMENT AND PLAN  TREATMENT PLAN FOR OBESITY:  Recommended Dietary Goals  Brandi Kirk is currently in the action stage of change. As such, her goal is to continue weight management plan. She has agreed to the Category 1 Plan. We've talked extensively about maintaining her weight at this time and working on meeting calories and protein goals.    Behavioral Intervention  We discussed the following Behavioral Modification Strategies today: continue to work on maintaining a reduced calorie state, getting the recommended amount of protein, incorporating whole foods, making healthy choices, staying well hydrated and practicing mindfulness when eating..  Additional resources provided today: NA  Recommended Physical Activity Goals  Brandi Kirk has been advised to work up to 150 minutes of moderate intensity aerobic activity a week and strengthening exercises 2-3 times per week for cardiovascular health, weight loss maintenance and preservation of muscle mass.   She has agreed to Think about enjoyable ways to increase daily physical activity and overcoming barriers to exercise and Increase physical activity in their day and reduce sedentary time (increase NEAT).   Pharmacotherapy We discussed various medication options to help Brandi Kirk with her weight  loss efforts and we both agreed to continue Zepbound 7.5mg  and Topamax 50mg .  Side effects discussed.    ASSOCIATED CONDITIONS ADDRESSED TODAY  Action/Plan  Polyphagia -     Continue Zepbound; Inject 7.5 mg into the skin once a week.  Dispense: 2 mL; Refill: 0.  Side effects discussed.   Abnormal craving -     Increase Topiramate; Take 1 tablet po BID  Dispense: 60 tablet; Refill: 0  SOBOE (shortness of breath on exertion) -     Ambulatory referral to Pulmonology. Discussed the importance of stopping vaping.    Generalized obesity -     Zepbound; Inject 7.5 mg into the skin once a week.  Dispense: 2 mL; Refill: 0         Return in about 4 weeks (around 09/21/2023).Marland Kitchen She was informed of the importance of frequent follow up visits to maximize her success with intensive lifestyle modifications for her multiple health conditions.   ATTESTASTION STATEMENTS:  Reviewed by clinician on day of visit: allergies, medications, problem list, medical history, surgical history, family history, social history, and previous encounter notes.     Theodis Sato. Vere Diantonio FNP-C

## 2023-08-24 NOTE — Progress Notes (Deleted)
Office: [phone]  /  Fax: [phone]   Initial Visit  Brandi Kirk was seen in clinic today to evaluate for obesity. She is interested in losing weight to improve overall health and reduce the risk of weight related complications. She presents today to review program treatment options, initial physical assessment, and evaluation.     She was referred by: {emreferby:28303}  When asked what else they would like to accomplish? She states: {EMHopetoaccomplish:28304}  Weight history:  When asked how has your weight affected you? She states: {EMWeightAffected:28305}  Some associated conditions: {EMSomeConditions:28306}  Contributing factors: {EMcontributingfactors:28307}  Weight promoting medications identified: {EMWeightpromotingrx:28308}  Current nutrition plan: {EMNutritionplan:28309::"None"}  Current level of physical activity: {EMcurrentPA:28310::"None"}  Current or previous pharmacotherapy: {EM previousRx:28311}  Response to medication: {EMResponsetomedication:28312}   Past medical history includes:   Past Medical History:  Diagnosis Date   Alcohol addiction (HCC)    Alcoholic intoxication with complication (HCC) 11/15/2019   Anemia    Anxiety    Aspiration pneumonitis (HCC) 03/27/2021   B12 deficiency    Back pain 03/18/2016   Bilateral lower extremity edema 11/11/2019   Bipolar 1 disorder (HCC)    Blood in stool 07/2017   internal hemorrhoid    Chest pain    Chicken pox    Cirrhosis with alcoholism (HCC)    Depression    Depression    Fatty liver    Fractured rib 2019   Frequent headaches    Frequent UTI    GERD (gastroesophageal reflux disease)    High cholesterol    HPV in female    Hypertension    Hypomagnesemia 11/17/2017   Joint pain    Migraine    Multiple closed fractures of ribs of right side 03/15/2018   Neutrophilic leukocytosis 03/27/2021   Obesity    Palpitations    PCOS (polycystic ovarian syndrome)    Prediabetes    SOB (shortness  of breath)    Tachycardia 01/12/2019   Vitamin D deficiency    Vocal cord dysfunction      Objective:   There were no vitals taken for this visit. She was weighed on the bioimpedance scale: There is no height or weight on file to calculate BMI.  Peak Weight:*** , Body Fat%:***, Visceral Fat Rating:***, Weight trend over the last 12 months: {emweighttrend:28333}  General:  Alert, oriented and cooperative. Patient is in no acute distress.  Respiratory: Normal respiratory effort, no problems with respiration noted   Gait: able to ambulate independently  Mental Status: Normal mood and affect. Normal behavior. Normal judgment and thought content.   DIAGNOSTIC DATA REVIEWED:  BMET    Component Value Date/Time   NA 140 06/25/2023 1127   K 4.8 06/25/2023 1127   CL 104 06/25/2023 1127   CO2 21 06/25/2023 1127   GLUCOSE 88 06/25/2023 1127   GLUCOSE 107 (H) 07/17/2021 0943   BUN 17 06/25/2023 1127   CREATININE 0.61 06/25/2023 1127   CREATININE 0.52 11/11/2019 1619   CALCIUM 9.4 06/25/2023 1127   GFRNONAA >60 03/22/2019 2328   GFRAA >60 03/22/2019 2328   Lab Results  Component Value Date   HGBA1C 5.3 05/30/2022   HGBA1C 5.2 11/25/2016   Lab Results  Component Value Date   INSULIN 9.2 11/27/2021   CBC    Component Value Date/Time   WBC 5.5 06/25/2023 1127   WBC 9.3 08/12/2021 1029   RBC 4.46 06/25/2023 1127   RBC 4.21 02/12/2023 0000   HGB 13.6 06/25/2023 1127   HCT 42.2  06/25/2023 1127   PLT 261 06/25/2023 1127   MCV 95 06/25/2023 1127   MCH 30.5 06/25/2023 1127   MCH 35.0 (H) 11/11/2019 1619   MCHC 32.2 06/25/2023 1127   MCHC 33.2 08/12/2021 1029   RDW 11.4 (L) 06/25/2023 1127   Iron/TIBC/Ferritin/ %Sat    Component Value Date/Time   IRON 117 06/25/2023 1127   TIBC 251 05/30/2022 0953   FERRITIN 363 (H) 06/25/2023 1127   IRONPCTSAT 34 05/30/2022 0953   IRONPCTSAT 23 07/17/2021 0943   Lipid Panel     Component Value Date/Time   CHOL 146 02/12/2023 0000    CHOL 164 09/15/2022 0939   TRIG 61 02/12/2023 0000   HDL 40 02/12/2023 0000   HDL 48 09/15/2022 0939   CHOLHDL 4.7 (H) 05/30/2022 0953   CHOLHDL 5 05/02/2020 1022   VLDL 36.4 05/02/2020 1022   LDLCALC 93 02/12/2023 0000   LDLCALC 105 (H) 09/15/2022 0939   Hepatic Function Panel     Component Value Date/Time   PROT 7.2 06/25/2023 1127   ALBUMIN 4.8 06/25/2023 1127   AST 16 06/25/2023 1127   ALT 21 06/25/2023 1127   ALT 361 11/23/2017 0000   ALKPHOS 59 06/25/2023 1127   BILITOT 1.1 06/25/2023 1127   BILIDIR 0.1 11/11/2021 0947      Component Value Date/Time   TSH 0.66 02/12/2023 0000   TSH 1.260 05/30/2022 0953     Assessment and Plan:   Abnormal craving  Generalized obesity  Polyphagia        Obesity Treatment / Action Plan:  {EMobesityactionplanscribe:28314::"Patient will work on garnering support from family and friends to begin weight loss journey.","Will work on eliminating or reducing the presence of highly palatable, calorie dense foods in the home.","Will complete provided nutritional and psychosocial assessment questionnaire before the next appointment.","Will be scheduled for indirect calorimetry to determine resting energy expenditure in a fasting state.  This will allow [place] to create a reduced calorie, high-protein meal plan to promote loss of fat mass while preserving muscle mass.","Counseled on the health benefits of losing 5%-15% of total body weight.","Was counseled on nutritional approaches to weight loss and benefits of reducing processed foods and consuming plant-based foods and high quality protein as part of nutritional weight management.","Was counseled on pharmacotherapy and role as an adjunct in weight management. "}  Obesity Education Performed Today:  She was weighed on the bioimpedance scale and results were discussed and documented in the synopsis.  We discussed obesity as a disease and the importance of a more detailed evaluation of all the  factors contributing to the disease.  We discussed the importance of long term lifestyle changes which include nutrition, exercise and behavioral modifications as well as the importance of customizing this to her specific health and social needs.  We discussed the benefits of reaching a healthier weight to alleviate the symptoms of existing conditions and reduce the risks of the biomechanical, metabolic and psychological effects of obesity.  Murdis Holeman appears to be in the action stage of change and states they are ready to start intensive lifestyle modifications and behavioral modifications.  *** minutes was spent today on this visit including the above counseling, pre-visit chart review, and post-visit documentation.  Reviewed by clinician on day of visit: allergies, medications, problem list, medical history, surgical history, family history, social history, and previous encounter notes pertinent to obesity diagnosis.    Theodis Sato Sebrina Kessner FNP-C

## 2023-09-19 ENCOUNTER — Other Ambulatory Visit: Payer: Self-pay | Admitting: Nurse Practitioner

## 2023-09-19 DIAGNOSIS — R638 Other symptoms and signs concerning food and fluid intake: Secondary | ICD-10-CM

## 2023-09-22 ENCOUNTER — Encounter: Payer: Self-pay | Admitting: Nurse Practitioner

## 2023-09-22 ENCOUNTER — Ambulatory Visit: Payer: 59 | Admitting: Nurse Practitioner

## 2023-09-22 VITALS — BP 122/78 | HR 102 | Temp 98.3°F | Ht 63.0 in | Wt 159.0 lb

## 2023-09-22 DIAGNOSIS — Z6828 Body mass index (BMI) 28.0-28.9, adult: Secondary | ICD-10-CM | POA: Diagnosis not present

## 2023-09-22 DIAGNOSIS — R632 Polyphagia: Secondary | ICD-10-CM

## 2023-09-22 DIAGNOSIS — E669 Obesity, unspecified: Secondary | ICD-10-CM | POA: Diagnosis not present

## 2023-09-22 DIAGNOSIS — R0602 Shortness of breath: Secondary | ICD-10-CM

## 2023-09-22 MED ORDER — ZEPBOUND 7.5 MG/0.5ML ~~LOC~~ SOAJ: 7.5000 mg | SUBCUTANEOUS | 0 refills | Status: DC

## 2023-09-22 NOTE — Progress Notes (Signed)
Office: [phone]  /  Fax: [phone]  WEIGHT SUMMARY AND BIOMETRICS  Weight Lost Since Last Visit: 5lb  Weight Gained Since Last Visit: 0lb   Vitals Temp: 98.3 F (36.8 C) BP: 122/78 Pulse Rate: (!) 102 SpO2: 98 %   Anthropometric Measurements Height: 5\' 3"  (1.6 m) Weight: 159 lb (72.1 kg) BMI (Calculated): 28.17 Weight at Last Visit: 164lb Weight Lost Since Last Visit: 5lb Weight Gained Since Last Visit: 0lb Starting Weight: 262lb Total Weight Loss (lbs): 103 lb (46.7 kg)   Body Composition  Body Fat %: 36.5 % Fat Mass (lbs): 58.2 lbs Muscle Mass (lbs): 96 lbs Total Body Water (lbs): 71.8 lbs Visceral Fat Rating : 6   Other Clinical Data Fasting: No Labs: No Today's Visit #: 24 Starting Date: 11/27/21     HPI  Chief Complaint: OBESITY  Brandi Kirk is here to discuss her progress with her obesity treatment plan. She is on the the Category 1 Plan and states she is following her eating plan approximately 85 % of the time. She states she is exercising 0 minutes 0 days per week.   Interval History:  Since last office visit she has lost 5 pounds.  She has increased her protein intake-yesterday she had 100 grams of protein when prior she was eating 60-70 grams of protein.  She was sick for a week and wasn't able to follow her meal plan.  She took her Zepbound 10 days apart due to not eating much except foods such as soup/broth.  She is seeing a nutritionist at work.  She is drinking 1-2 protein shakes, coffee and water.  She is planning to start exercising.    Pharmacotherapy for weight loss: She is currently taking Zepbound 7.5mg  for medical weight loss and Topamax 50mg  for cravings.  Denies side effects.     Previous pharmacotherapy for medical weight loss:   She has tried Phentermine, Zepbound and Qsymia. She stopped Phentermine due to side effects of feeling jittery.    Bariatric surgery:  Has not had bariatric surgery  Vaping Hasn't been contacted yet  to sched appt.  Has decreased her vaping by half since her last visit    PHYSICAL EXAM:  Blood pressure 122/78, pulse (!) 102, temperature 98.3 F (36.8 C), height 5\' 3"  (1.6 m), weight 159 lb (72.1 kg), SpO2 98%. Body mass index is 28.17 kg/m.  General: She is overweight, cooperative, alert, well developed, and in no acute distress. PSYCH: Has normal mood, affect and thought process.   Extremities: No edema.  Neurologic: No gross sensory or motor deficits. No tremors or fasciculations noted.    DIAGNOSTIC DATA REVIEWED:  BMET    Component Value Date/Time   NA 140 06/25/2023 1127   K 4.8 06/25/2023 1127   CL 104 06/25/2023 1127   CO2 21 06/25/2023 1127   GLUCOSE 88 06/25/2023 1127   GLUCOSE 107 (H) 07/17/2021 0943   BUN 17 06/25/2023 1127   CREATININE 0.61 06/25/2023 1127   CREATININE 0.52 11/11/2019 1619   CALCIUM 9.4 06/25/2023 1127   GFRNONAA >60 03/22/2019 2328   GFRAA >60 03/22/2019 2328   Lab Results  Component Value Date   HGBA1C 5.3 05/30/2022   HGBA1C 5.2 11/25/2016   Lab Results  Component Value Date   INSULIN 9.2 11/27/2021   Lab Results  Component Value Date   TSH 0.66 02/12/2023   CBC    Component Value Date/Time   WBC 5.5 06/25/2023 1127   WBC 9.3 08/12/2021 1029  RBC 4.46 06/25/2023 1127   RBC 4.21 02/12/2023 0000   HGB 13.6 06/25/2023 1127   HCT 42.2 06/25/2023 1127   PLT 261 06/25/2023 1127   MCV 95 06/25/2023 1127   MCH 30.5 06/25/2023 1127   MCH 35.0 (H) 11/11/2019 1619   MCHC 32.2 06/25/2023 1127   MCHC 33.2 08/12/2021 1029   RDW 11.4 (L) 06/25/2023 1127   Iron Studies    Component Value Date/Time   IRON 117 06/25/2023 1127   TIBC 251 05/30/2022 0953   FERRITIN 363 (H) 06/25/2023 1127   IRONPCTSAT 34 05/30/2022 0953   IRONPCTSAT 23 07/17/2021 0943   Lipid Panel     Component Value Date/Time   CHOL 146 02/12/2023 0000   CHOL 164 09/15/2022 0939   TRIG 61 02/12/2023 0000   HDL 40 02/12/2023 0000   HDL 48 09/15/2022 0939    CHOLHDL 4.7 (H) 05/30/2022 0953   CHOLHDL 5 05/02/2020 1022   VLDL 36.4 05/02/2020 1022   LDLCALC 93 02/12/2023 0000   LDLCALC 105 (H) 09/15/2022 0939   Hepatic Function Panel     Component Value Date/Time   PROT 7.2 06/25/2023 1127   ALBUMIN 4.8 06/25/2023 1127   AST 16 06/25/2023 1127   ALT 21 06/25/2023 1127   ALT 361 11/23/2017 0000   ALKPHOS 59 06/25/2023 1127   BILITOT 1.1 06/25/2023 1127   BILIDIR 0.1 11/11/2021 0947      Component Value Date/Time   TSH 0.66 02/12/2023 0000   TSH 1.260 05/30/2022 0953   Nutritional Lab Results  Component Value Date   VD25OH 38.3 06/25/2023   VD25OH 30.6 09/15/2022   VD25OH 33.0 05/30/2022     ASSESSMENT AND PLAN  TREATMENT PLAN FOR OBESITY:  Recommended Dietary Goals  Brandi Kirk is currently in the action stage of change. As such, her goal is to continue weight management plan. She has agreed to the Category 1 Plan +200 calories and 100 grams of protein.  If muscle mass continues to decrease will stop Zepbound.   Behavioral Intervention  We discussed the following Behavioral Modification Strategies today: increasing lean protein intake to established goals, increasing water intake , planning for success, celebration eating strategies, and continue to work on maintaining a reduced calorie state, getting the recommended amount of protein, incorporating whole foods, making healthy choices, staying well hydrated and practicing mindfulness when eating..  Additional resources provided today: NA  Recommended Physical Activity Goals  Brandi Kirk has been advised to work up to 150 minutes of moderate intensity aerobic activity a week and strengthening exercises 2-3 times per week for cardiovascular health, weight loss maintenance and preservation of muscle mass.   She has agreed to Think about enjoyable ways to increase daily physical activity and overcoming barriers to exercise, Increase physical activity in their day and reduce sedentary  time (increase NEAT)., and Work on scheduling and tracking physical activity.    Pharmacotherapy We discussed various medication options to help Brandi Kirk with her weight loss efforts and we both agreed to Zepbound 7.5mg .  Side effects discussed.  Will D/C at next visit if muscle mass continues to decrease and she is not meeting calories/protein goals.   ASSOCIATED CONDITIONS ADDRESSED TODAY  Action/Plan  Polyphagia -     Zepbound; Inject 7.5 mg into the skin once a week.  Dispense: 2 mL; Refill: 0  SOBOE (shortness of breath on exertion) Needs to sched appt with pulmonary  Generalized obesity -     Zepbound; Inject 7.5 mg into the skin  once a week.  Dispense: 2 mL; Refill: 0  BMI 28.0-28.9,adult     Goals:  Plans to start meal prepping Increase protein intake Drinking more water Start exercising  Will obtain labs at next visit.  If still elevated will refer to hematology.   Return in about 4 weeks (around 10/20/2023).Marland Kitchen She was informed of the importance of frequent follow up visits to maximize her success with intensive lifestyle modifications for her multiple health conditions.   ATTESTASTION STATEMENTS:  Reviewed by clinician on day of visit: allergies, medications, problem list, medical history, surgical history, family history, social history, and previous encounter notes.    Theodis Sato. Brandi Port FNP-C

## 2023-10-26 ENCOUNTER — Telehealth (INDEPENDENT_AMBULATORY_CARE_PROVIDER_SITE_OTHER): Payer: 59 | Admitting: Nurse Practitioner

## 2023-10-26 ENCOUNTER — Encounter: Payer: Self-pay | Admitting: Nurse Practitioner

## 2023-10-26 DIAGNOSIS — R638 Other symptoms and signs concerning food and fluid intake: Secondary | ICD-10-CM | POA: Diagnosis not present

## 2023-10-26 DIAGNOSIS — Z7985 Long-term (current) use of injectable non-insulin antidiabetic drugs: Secondary | ICD-10-CM

## 2023-10-26 DIAGNOSIS — E669 Obesity, unspecified: Secondary | ICD-10-CM

## 2023-10-26 DIAGNOSIS — R632 Polyphagia: Secondary | ICD-10-CM | POA: Diagnosis not present

## 2023-10-26 MED ORDER — TOPIRAMATE 50 MG PO TABS: ORAL_TABLET | ORAL | 1 refills | Status: DC

## 2023-10-26 MED ORDER — ZEPBOUND 7.5 MG/0.5ML ~~LOC~~ SOAJ
7.5000 mg | SUBCUTANEOUS | 1 refills | Status: DC
Start: 2023-10-26 — End: 2023-11-18

## 2023-10-26 MED ORDER — TOPIRAMATE 50 MG PO TABS: ORAL_TABLET | ORAL | 0 refills | Status: DC

## 2023-10-26 NOTE — Progress Notes (Signed)
Office: [phone]  /  Fax: [phone]  WEIGHT SUMMARY AND BIOMETRICS  TeleHealth Visit:  This visit was completed with telemedicine (audio/video) technology. Darice has verbally consented to this TeleHealth visit. The patient is located at home, the provider is located at [facility]. The participants in this visit include the listed provider and patient. The visit was conducted today via MyChart video.   HPI  Chief Complaint: OBESITY  Brandi Kirk is here to discuss her progress with her obesity treatment plan. She is on the the Category 1 Plan and states she is following her eating plan approximately 85 % of the time. She states she is exercising 0 minutes 0 days per week.   Interval History:  Patient is here today via video visit.  Her reported weight today is 162 lbs.   She has been doing well with the meal plan.  Has increased her protein intake.  Has limited protein shakes and is eating food more. She is drinking water daily.   Pharmacotherapy for weight loss: She is currently taking Zepbound 7.5mg  for medical weight loss and Topamax 50mg  BID for cravings.  Denies side effects.     Previous pharmacotherapy for medical weight loss:   She has tried Phentermine, Zepbound and Qsymia. She stopped Phentermine due to side effects of feeling jittery.    Bariatric surgery:  She has not had bariatric surgery    PHYSICAL EXAM:  There were no vitals taken for this visit. There is no height or weight on file to calculate BMI.  General: She is overweight, cooperative, alert, well developed, and in no acute distress. PSYCH: Has normal mood, affect and thought process.   Extremities: No edema.  Neurologic: No gross sensory or motor deficits. No tremors or fasciculations noted.    DIAGNOSTIC DATA REVIEWED:  BMET    Component Value Date/Time   NA 140 06/25/2023 1127   K 4.8 06/25/2023 1127   CL 104 06/25/2023 1127   CO2 21 06/25/2023 1127   GLUCOSE 88 06/25/2023 1127   GLUCOSE  107 (H) 07/17/2021 0943   BUN 17 06/25/2023 1127   CREATININE 0.61 06/25/2023 1127   CREATININE 0.52 11/11/2019 1619   CALCIUM 9.4 06/25/2023 1127   GFRNONAA >60 03/22/2019 2328   GFRAA >60 03/22/2019 2328   Lab Results  Component Value Date   HGBA1C 5.3 05/30/2022   HGBA1C 5.2 11/25/2016   Lab Results  Component Value Date   INSULIN 9.2 11/27/2021   Lab Results  Component Value Date   TSH 0.66 02/12/2023   CBC    Component Value Date/Time   WBC 5.5 06/25/2023 1127   WBC 9.3 08/12/2021 1029   RBC 4.46 06/25/2023 1127   RBC 4.21 02/12/2023 0000   HGB 13.6 06/25/2023 1127   HCT 42.2 06/25/2023 1127   PLT 261 06/25/2023 1127   MCV 95 06/25/2023 1127   MCH 30.5 06/25/2023 1127   MCH 35.0 (H) 11/11/2019 1619   MCHC 32.2 06/25/2023 1127   MCHC 33.2 08/12/2021 1029   RDW 11.4 (L) 06/25/2023 1127   Iron Studies    Component Value Date/Time   IRON 117 06/25/2023 1127   TIBC 251 05/30/2022 0953   FERRITIN 363 (H) 06/25/2023 1127   IRONPCTSAT 34 05/30/2022 0953   IRONPCTSAT 23 07/17/2021 0943   Lipid Panel     Component Value Date/Time   CHOL 146 02/12/2023 0000   CHOL 164 09/15/2022 0939   TRIG 61 02/12/2023 0000   HDL 40 02/12/2023 0000  HDL 48 09/15/2022 0939   CHOLHDL 4.7 (H) 05/30/2022 0953   CHOLHDL 5 05/02/2020 1022   VLDL 36.4 05/02/2020 1022   LDLCALC 93 02/12/2023 0000   LDLCALC 105 (H) 09/15/2022 0939   Hepatic Function Panel     Component Value Date/Time   PROT 7.2 06/25/2023 1127   ALBUMIN 4.8 06/25/2023 1127   AST 16 06/25/2023 1127   ALT 21 06/25/2023 1127   ALT 361 11/23/2017 0000   ALKPHOS 59 06/25/2023 1127   BILITOT 1.1 06/25/2023 1127   BILIDIR 0.1 11/11/2021 0947      Component Value Date/Time   TSH 0.66 02/12/2023 0000   TSH 1.260 05/30/2022 0953   Nutritional Lab Results  Component Value Date   VD25OH 38.3 06/25/2023   VD25OH 30.6 09/15/2022   VD25OH 33.0 05/30/2022     ASSESSMENT AND PLAN  TREATMENT PLAN FOR  OBESITY:  Recommended Dietary Goals  Dina is currently in the action stage of change. As such, her goal is to continue weight management plan. She has agreed to the Category 1 Plan.  Behavioral Intervention  We discussed the following Behavioral Modification Strategies today: increasing lean protein intake to established goals, increasing vegetables, increasing fiber rich foods, increasing water intake , work on meal planning and preparation, reading food labels , keeping healthy foods at home, planning for success, and continue to work on maintaining a reduced calorie state, getting the recommended amount of protein, incorporating whole foods, making healthy choices, staying well hydrated and practicing mindfulness when eating..  Additional resources provided today: NA  Recommended Physical Activity Goals  Daneille has been advised to work up to 150 minutes of moderate intensity aerobic activity a week and strengthening exercises 2-3 times per week for cardiovascular health, weight loss maintenance and preservation of muscle mass.   She has agreed to Think about enjoyable ways to increase daily physical activity and overcoming barriers to exercise, Increase physical activity in their day and reduce sedentary time (increase NEAT)., and Work on scheduling and tracking physical activity.    Pharmacotherapy We discussed various medication options to help Dorise with her weight loss efforts and we both agreed to continue Zepbound 7.5mg  and Topamax 50mg  BID for cravings.  .  ASSOCIATED CONDITIONS ADDRESSED TODAY  Action/Plan  Polyphagia -     Zepbound; Inject 7.5 mg into the skin once a week.  Dispense: 2 mL; Refill: 1  Abnormal craving -     Topiramate; Take 1 tablet po BID  Dispense: 60 tablet; Refill: 1  Generalized obesity -     Zepbound; Inject 7.5 mg into the skin once a week.  Dispense: 2 mL; Refill: 1      Will obtain labs at next visit due to today being a video visit.      Return in about 8 weeks (around 12/21/2023).Marland Kitchen She was informed of the importance of frequent follow up visits to maximize her success with intensive lifestyle modifications for her multiple health conditions.   ATTESTASTION STATEMENTS:  Reviewed by clinician on day of visit: allergies, medications, problem list, medical history, surgical history, family history, social history, and previous encounter notes.     Theodis Sato. Azariel Banik FNP-C

## 2023-11-17 ENCOUNTER — Encounter: Payer: Self-pay | Admitting: Nurse Practitioner

## 2023-11-18 ENCOUNTER — Encounter: Payer: Self-pay | Admitting: Nurse Practitioner

## 2023-11-18 ENCOUNTER — Ambulatory Visit: Payer: 59 | Admitting: Nurse Practitioner

## 2023-11-18 VITALS — BP 109/69 | HR 76 | Temp 98.1°F | Ht 63.0 in | Wt 154.0 lb

## 2023-11-18 DIAGNOSIS — E669 Obesity, unspecified: Secondary | ICD-10-CM

## 2023-11-18 DIAGNOSIS — R638 Other symptoms and signs concerning food and fluid intake: Secondary | ICD-10-CM

## 2023-11-18 DIAGNOSIS — Z6827 Body mass index (BMI) 27.0-27.9, adult: Secondary | ICD-10-CM | POA: Diagnosis not present

## 2023-11-18 DIAGNOSIS — R632 Polyphagia: Secondary | ICD-10-CM

## 2023-11-18 MED ORDER — ZEPBOUND 7.5 MG/0.5ML ~~LOC~~ SOAJ: 7.5000 mg | SUBCUTANEOUS | 0 refills | Status: DC

## 2023-11-18 MED ORDER — TOPIRAMATE 50 MG PO TABS: ORAL_TABLET | ORAL | 0 refills | Status: DC

## 2023-11-18 NOTE — Progress Notes (Signed)
Office: [phone]  /  Fax: [phone]  WEIGHT SUMMARY AND BIOMETRICS  Weight Lost Since Last Visit: 5lb  Weight Gained Since Last Visit: 0lb   Vitals Temp: 98.1 F (36.7 C) BP: 109/69 Pulse Rate: 76 SpO2: 98 %   Anthropometric Measurements Height: 5\' 3"  (1.6 m) Weight: 154 lb (69.9 kg) BMI (Calculated): 27.29 Weight at Last Visit: 159lb Weight Lost Since Last Visit: 5lb Weight Gained Since Last Visit: 0lb Starting Weight: 262lb Total Weight Loss (lbs): 108 lb (49 kg)   Body Composition  Body Fat %: 35.2 % Fat Mass (lbs): 54.4 lbs Muscle Mass (lbs): 95 lbs Total Body Water (lbs): 70.4 lbs Visceral Fat Rating : 5   Other Clinical Data Fasting: Yes Labs: No Today's Visit #: 25 Starting Date: 11/27/21     HPI  Chief Complaint: OBESITY  Brandi Kirk is here to discuss her progress with her obesity treatment plan. She is on the the Category 1 Plan and states she is following her eating plan approximately 85 % of the time. She states she is exercising 0 minutes 0 days per week.   Interval History:  Since last office visit she has lost 5 pounds.  She overall has done well with weight loss.  She had the flu since her last visit and struggled with getting over it.  She plans to work on increasing her protein intake and start exercising.  She snacks on yogurt, [place] sticks, eggs, beef jerky and cheese sticks.  She is drinking water and a protein shake daily.   Her highest weight was 296 lbs.    Pharmacotherapy for weight loss: She is currently taking Zepbound 7.5mg  for medical weight loss and Topamax 50mg  BID for cravings.  Denies side effects.  Both have helped with polyphagia and cravings.     Previous pharmacotherapy for medical weight loss:   She has tried Phentermine, Zepbound and Qsymia. She stopped Phentermine due to side effects of feeling jittery.    Bariatric surgery:  She has not had bariatric surgery       PHYSICAL EXAM:  Blood pressure 109/69,  pulse 76, temperature 98.1 F (36.7 C), height 5\' 3"  (1.6 m), weight 154 lb (69.9 kg), SpO2 98%. Body mass index is 27.28 kg/m.  General: She is overweight, cooperative, alert, well developed, and in no acute distress. PSYCH: Has normal mood, affect and thought process.   Extremities: No edema.  Neurologic: No gross sensory or motor deficits. No tremors or fasciculations noted.    DIAGNOSTIC DATA REVIEWED:  BMET    Component Value Date/Time   NA 140 06/25/2023 1127   K 4.8 06/25/2023 1127   CL 104 06/25/2023 1127   CO2 21 06/25/2023 1127   GLUCOSE 88 06/25/2023 1127   GLUCOSE 107 (H) 07/17/2021 0943   BUN 17 06/25/2023 1127   CREATININE 0.61 06/25/2023 1127   CREATININE 0.52 11/11/2019 1619   CALCIUM 9.4 06/25/2023 1127   GFRNONAA >60 03/22/2019 2328   GFRAA >60 03/22/2019 2328   Lab Results  Component Value Date   HGBA1C 5.3 05/30/2022   HGBA1C 5.2 11/25/2016   Lab Results  Component Value Date   INSULIN 9.2 11/27/2021   Lab Results  Component Value Date   TSH 0.66 02/12/2023   CBC    Component Value Date/Time   WBC 5.5 06/25/2023 1127   WBC 9.3 08/12/2021 1029   RBC 4.46 06/25/2023 1127   RBC 4.21 02/12/2023 0000   HGB 13.6 06/25/2023 1127   HCT 42.2  06/25/2023 1127   PLT 261 06/25/2023 1127   MCV 95 06/25/2023 1127   MCH 30.5 06/25/2023 1127   MCH 35.0 (H) 11/11/2019 1619   MCHC 32.2 06/25/2023 1127   MCHC 33.2 08/12/2021 1029   RDW 11.4 (L) 06/25/2023 1127   Iron Studies    Component Value Date/Time   IRON 117 06/25/2023 1127   TIBC 251 05/30/2022 0953   FERRITIN 363 (H) 06/25/2023 1127   IRONPCTSAT 34 05/30/2022 0953   IRONPCTSAT 23 07/17/2021 0943   Lipid Panel     Component Value Date/Time   CHOL 146 02/12/2023 0000   CHOL 164 09/15/2022 0939   TRIG 61 02/12/2023 0000   HDL 40 02/12/2023 0000   HDL 48 09/15/2022 0939   CHOLHDL 4.7 (H) 05/30/2022 0953   CHOLHDL 5 05/02/2020 1022   VLDL 36.4 05/02/2020 1022   LDLCALC 93 02/12/2023  0000   LDLCALC 105 (H) 09/15/2022 0939   Hepatic Function Panel     Component Value Date/Time   PROT 7.2 06/25/2023 1127   ALBUMIN 4.8 06/25/2023 1127   AST 16 06/25/2023 1127   ALT 21 06/25/2023 1127   ALT 361 11/23/2017 0000   ALKPHOS 59 06/25/2023 1127   BILITOT 1.1 06/25/2023 1127   BILIDIR 0.1 11/11/2021 0947      Component Value Date/Time   TSH 0.66 02/12/2023 0000   TSH 1.260 05/30/2022 0953   Nutritional Lab Results  Component Value Date   VD25OH 38.3 06/25/2023   VD25OH 30.6 09/15/2022   VD25OH 33.0 05/30/2022     ASSESSMENT AND PLAN  TREATMENT PLAN FOR OBESITY:  Recommended Dietary Goals  [name] is currently in the action stage of change. As such, her goal is to continue weight management plan. She has agreed to the Category 2 Plan.  Behavioral Intervention  We discussed the following Behavioral Modification Strategies today: increasing lean protein intake to established goals, increasing vegetables, increasing water intake , work on meal planning and preparation, reading food labels , keeping healthy foods at home, and continue to work on maintaining a reduced calorie state, getting the recommended amount of protein, incorporating whole foods, making healthy choices, staying well hydrated and practicing mindfulness when eating..  Additional resources provided today: NA  Recommended Physical Activity Goals  [name] has been advised to work up to 150 minutes of moderate intensity aerobic activity a week and strengthening exercises 2-3 times per week for cardiovascular health, weight loss maintenance and preservation of muscle mass.   She has agreed to Think about enjoyable ways to increase daily physical activity and overcoming barriers to exercise, Increase physical activity in their day and reduce sedentary time (increase NEAT)., and Work on scheduling and tracking physical activity.    Pharmacotherapy We discussed various medication options to help  [name] with her weight loss efforts and we both agreed to continue Zepbound 7.5mg  and Topamax 50mg  BID for cravings. Side effects discussed.   ASSOCIATED CONDITIONS ADDRESSED TODAY  Action/Plan  Abnormal craving -     Topiramate; Take 1 tablet po BID  Dispense: 60 tablet; Refill: 0  Polyphagia -     Zepbound; Inject 7.5 mg into the skin once a week.  Dispense: 2 mL; Refill: 0  Generalized obesity -     Zepbound; Inject 7.5 mg into the skin once a week.  Dispense: 2 mL; Refill: 0  BMI 27.0-27.9,adult      Needs to work on increasing protein intake and start exercising especially resistance training.  Will obtain labs at next visit   Return in about 5 weeks (around 12/23/2023).Marland Kitchen She was informed of the importance of frequent follow up visits to maximize her success with intensive lifestyle modifications for her multiple health conditions.   ATTESTASTION STATEMENTS:  Reviewed by clinician on day of visit: allergies, medications, problem list, medical history, surgical history, family history, social history, and previous encounter notes.    Theodis Sato. Indalecio Malmstrom FNP-C

## 2023-11-20 ENCOUNTER — Telehealth (INDEPENDENT_AMBULATORY_CARE_PROVIDER_SITE_OTHER): Payer: 59 | Admitting: Internal Medicine

## 2023-11-20 ENCOUNTER — Ambulatory Visit: Payer: Self-pay | Admitting: Nurse Practitioner

## 2023-11-20 ENCOUNTER — Encounter: Payer: Self-pay | Admitting: Internal Medicine

## 2023-11-20 DIAGNOSIS — M542 Cervicalgia: Secondary | ICD-10-CM

## 2023-11-20 DIAGNOSIS — M549 Dorsalgia, unspecified: Secondary | ICD-10-CM

## 2023-11-20 DIAGNOSIS — F419 Anxiety disorder, unspecified: Secondary | ICD-10-CM | POA: Diagnosis not present

## 2023-11-20 DIAGNOSIS — M545 Low back pain, unspecified: Secondary | ICD-10-CM

## 2023-11-20 MED ORDER — MELOXICAM 15 MG PO TABS
15.0000 mg | ORAL_TABLET | Freq: Every day | ORAL | 0 refills | Status: DC
Start: 2023-11-20 — End: 2024-08-22

## 2023-11-20 MED ORDER — CYCLOBENZAPRINE HCL 10 MG PO TABS: 10.0000 mg | ORAL_TABLET | Freq: Three times a day (TID) | ORAL | 0 refills | Status: DC | PRN

## 2023-11-20 NOTE — Progress Notes (Signed)
 Patient Care Team: Early, Sung Amabile, NP as PCP - General (Nurse Practitioner) Carrington Clamp, MD as Consulting Physician (Obstetrics and Gynecology) Zachery Dakins, MD as Referring Physician (Internal Medicine)  I connected with Sonia Side on 11/20/23 at 4:06 PM by video enabled telemedicine visit and verified that I am speaking with the correct person using two identifiers, myself and Brandi Kirk, CMA. I am in my office and patient is in bed at home.   I discussed the limitations, risks, security and privacy concerns of performing an evaluation and management service by telemedicine and the availability of in-person appointments. I also discussed with the patient that there may be a patient responsible charge related to this service. The patient expressed understanding and agreed to proceed.   Other persons participating in the visit and their role in the encounter: Medical scribe, Larey Brick  Patient's location: Home  Provider's location: Clinic   I provided 25 minutes of time spent during this encounter, including chart review, interviewing patient, medical decision making, and e-scribing medication and > 50% was spent counseling as documented under my assessment & plan.   Chief Complaint  Patient presents with   Back Pain   Neck Pain    Bilateral hip pain.    Subjective:  Patient ZO:XWRUEA Kirk,Female DOB:Oct 04, 1985,38 y.o. VWU:[id number]   38 y.o. Female presents today for acute visit with Neck, Back, and Bilateral Hip Pain x1 week. Patient has a past medical history of traumatic encounter with garage door in 2015 causing neck and upper back pain and Frequent Headaches/Migraines (Tension Type). States that she is used to having tenseness in her shoulders, neck, and back but now She endorses muscle tension, spasms, and general myalgias/arthralgias. 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 Notes that pain has worsened severely over the past 3 days. Mentions trying heating pads, OTC meds, massage gun, dry sauna, jacuzzi, and a massage without relief. Endorses difficulty sleeping, even while taking Lunesta (per Dr. Loura Halt) due to her pain; denies fever/chills, cough, or diarrhea. Informs [place] that her sister was recently diagnosed with Ehlers-Danlos Syndrome. States that she tries to stay hydrated as much as possible.   Past Medical History:  Diagnosis Date   Alcohol addiction (HCC)    Alcoholic intoxication with complication (HCC) 11/15/2019   Anemia    Anxiety    Aspiration pneumonitis (HCC) 03/27/2021   B12 deficiency    Back pain 03/18/2016   Bilateral lower extremity edema 11/11/2019   Bipolar 1 disorder (HCC)    Blood in stool 07/2017   internal hemorrhoid    Chest pain    Chicken pox    Cirrhosis with alcoholism (HCC)    Depression    Depression    Fatty liver    Fractured rib 2019   Frequent headaches    Frequent UTI    GERD (gastroesophageal reflux disease)    High cholesterol    HPV in female    Hypertension    Hypomagnesemia 11/17/2017   Joint pain    Migraine    Multiple closed fractures of ribs of right side 03/15/2018   Neutrophilic leukocytosis 03/27/2021   Obesity    Palpitations    PCOS (polycystic ovarian syndrome)    Prediabetes    SOB (shortness of breath)    Tachycardia 01/12/2019   Vitamin D deficiency  Vocal cord dysfunction     Family History  Problem Relation Age of Onset   Diabetes Mother    Stroke Mother 47   Hypertension Mother    Heart murmur Mother    Alcohol abuse Mother    Arthritis Mother    Depression Mother    Early death Mother    Hyperlipidemia Mother    Kidney disease Mother    Mental illness Mother    Epilepsy Mother    Rheum arthritis Sister    Heart murmur Sister    Alcohol abuse Sister    Bipolar disorder Sister    Cirrhosis Sister     Heart murmur Brother    Alcohol abuse Brother    Depression Brother    Mental illness Brother        bipolar   Alcohol abuse Father    Depression Father    Mental illness Father    Arthritis Maternal Grandmother    Birth defects Maternal Grandmother    Hyperlipidemia Maternal Grandmother    Hyperlipidemia Maternal Grandfather    Alcohol abuse Maternal Grandfather    Arthritis Maternal Grandfather    Diabetes Maternal Grandfather    Stroke Maternal Grandfather    Arthritis Paternal Grandmother    Arthritis Paternal Grandfather    Birth defects Brother    Mental illness Brother        bipolar   Depression Brother    Mental illness Brother    Social History   Social History Narrative   Married. 1 child.    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    Alcohol abuse. Smoker.   Smoke alarm in the home   feels safe in her relationships.    Review of Systems  Musculoskeletal:  Positive for back pain, joint pain, myalgias and neck pain.  All other systems reviewed and are negative.    Objective:  Vitals: LMP  (LMP Unknown)  Physical Exam Vitals and nursing note reviewed.  Constitutional:      General: She is not in acute distress.    Appearance: Normal appearance. She is not toxic-appearing.     Comments: Appears uncomfortable  HENT:     Head: Normocephalic and atraumatic.  Pulmonary:     Effort: Pulmonary effort is normal.  Skin:    General: Skin is warm and dry.  Neurological:     Mental Status: She is alert and oriented to person, place, and time. Mental status is at baseline.  Psychiatric:        Mood and Affect: Mood normal.        Behavior: Behavior normal.        Thought Content: Thought content normal.        Judgment: Judgment normal.     Results:  Studies Obtained And Personally Reviewed By Me: Labs:     Component Value Date/Time   NA 140 06/25/2023 1127   K 4.8 06/25/2023 1127   CL 104 06/25/2023 1127   CO2 21 06/25/2023 1127   GLUCOSE 88  06/25/2023 1127   GLUCOSE 107 (H) 07/17/2021 0943   BUN 17 06/25/2023 1127   CREATININE 0.61 06/25/2023 1127   CREATININE 0.52 11/11/2019 1619   CALCIUM 9.4 06/25/2023 1127   PROT 7.2 06/25/2023 1127   ALBUMIN 4.8 06/25/2023 1127   AST 16 06/25/2023 1127   ALT 21 06/25/2023 1127   ALT 361 11/23/2017 0000   ALKPHOS 59 06/25/2023 1127   BILITOT 1.1 06/25/2023 1127   GFRNONAA >60 03/22/2019  2328   GFRAA >60 03/22/2019 2328    Lab Results  Component Value Date   WBC 5.5 06/25/2023   HGB 13.6 06/25/2023   HCT 42.2 06/25/2023   MCV 95 06/25/2023   PLT 261 06/25/2023   Lab Results  Component Value Date   CHOL 146 02/12/2023   HDL 40 02/12/2023   LDLCALC 93 02/12/2023   TRIG 61 02/12/2023   CHOLHDL 4.7 (H) 05/30/2022   Lab Results  Component Value Date   HGBA1C 5.3 05/30/2022    Lab Results  Component Value Date   TSH 0.66 02/12/2023    Assessment & Plan:   Musculoskeletal Pain: would like her to have an in-office visit soon with her primary care provider for physical exam and lab work due to limitations of video visit.   Sending in 15 mg Meloxicam - take 1 tablet (15 mg total) by mouth daily and 10 mg Flexeril - take 1 tablet (10 mg total) by mouth 3 (three) times daily as needed for muscle spasms.   Would suggest Rheumatology labs be drawn. Suggest CBC with diff, sed rate, C-met, ANA, RF, CCP for   in addition to rheumatoid factor Pt concerned also about having Ehlers Danlos syndrome as a family member apparently has this. May need spine films. May benefit from PT/ massage therapy.  Patient could  possibly have Polymyalgia rheumatica ( a high sed rate is c/w with this). She could have cervical or lumbar disc disease, she could have fibromyalgia syndrome, or Inflammatory arthritis. Need ANA to rule out lupus and CCP/RF to look for Rheumatoid arthritis Also suggest uric acid to check for gout.  .Clearly has anxiety on video visit today. Seems very uncomfortable in bed today  despite being seen for massage earlier today. Anxiety may be playing a role in that she has no clear dx for her symptoms.  She will call Primary care provider for appt. Next week.  Sending in Meloxicam 15 mg daily and Flexeril  10 mg 3 times daily as needed. Rest at home. No heavy lifting or exercise. May apply ice or heat to affected areas which ever is more helpful.  I,Emily Lagle,acting as a Neurosurgeon for Margaree Mackintosh, MD.,have documented all relevant documentation on the behalf of Margaree Mackintosh, MD,as directed by  Margaree Mackintosh, MD while in the presence of Margaree Mackintosh, MD.   I, Margaree Mackintosh, MD, have reviewed all documentation for this visit. The documentation on 11/22/23 for the exam, diagnosis, procedures, and orders are all accurate and complete.

## 2023-11-20 NOTE — Telephone Encounter (Signed)
  Chief Complaint: back pain Symptoms: neck and back pain radiates down back into hips and buttocks Frequency: x 1 week, worsened yesterday Pertinent Negatives: Patient denies numbness, weakness, loss of bowel or bladder control, abdominal pain, fever, blood in urine or burning with urination, pain radiating into thighs or further down legs. Disposition: [] ED /[] Urgent Care (no appt availability in office) / [x] Appointment(In office/virtual)/ []  [facility]/ [] Home Care/ [] Refused Recommended Disposition /[] [facility]/ []  Follow-up with PCP Additional Notes: Patient complains of neck and back pain. She states she has been treating with heat, Advil, Mobic and had a massage today as well to try to relieve the pain. Patient agreeable to virtual visit with available provider.  Copied from CRM [id number]. Topic: Clinical - Red Word Triage >> Nov 20, 2023  3:18 PM Antony Haste wrote: Red Word that prompted transfer to Nurse Triage: Extreme pain all over her body: mainly in neck, back, hips and spine. PT states she is also having muscle spasms. Reason for Disposition  [1] MODERATE back pain (e.g., interferes with normal activities) AND [2] present > 3 days  Answer Assessment - Initial Assessment Questions 1. ONSET: "When did the pain begin?"      About a week.  2. LOCATION: "Where does it hurt?" (upper, mid or lower back)     "Where shoulders connect to my neck. Base of my neck. Entire back"  3. SEVERITY: "How bad is the pain?"  (e.g., Scale 1-10; mild, moderate, or severe)   - MILD (1-3): Doesn't interfere with normal activities.    - MODERATE (4-7): Interferes with normal activities or awakens from sleep.    - SEVERE (8-10): Excruciating pain, unable to do any normal activities.      6/10, she states it feels like tight and spasms.  4. PATTERN: "Is the pain constant?" (e.g., yes, no; constant, intermittent)      She states it is constant at a 5/10 but yesterday was about an  8/10.  5. RADIATION: "Does the pain shoot into your legs or somewhere else?"     Radiates down entire back into both hips and buttocks.  6. CAUSE:  "What do you think is causing the back pain?"      Patient states it is typical for her to have neck and back pain. She states it could be related to stress and also mentioned she sits at a computer desk/chair.  7. BACK OVERUSE:  "Any recent lifting of heavy objects, strenuous work or exercise?"     Denies.  8. MEDICINES: "What have you taken so far for the pain?" (e.g., nothing, acetaminophen, NSAIDS)     Advil and Mobic.  9. NEUROLOGIC SYMPTOMS: "Do you have any weakness, numbness, or problems with bowel/bladder control?"     Denies.  10. OTHER SYMPTOMS: "Do you have any other symptoms?" (e.g., fever, abdomen pain, burning with urination, blood in urine)       Denies.  11. PREGNANCY: "Is there any chance you are pregnant?" "When was your last menstrual period?"       IUD, doesn't have cycles.  Protocols used: Back Pain-A-AH

## 2023-11-22 ENCOUNTER — Encounter: Payer: Self-pay | Admitting: Internal Medicine

## 2023-11-22 NOTE — Patient Instructions (Signed)
 Evaluation is by video today and pt was too uncomfortable to move around. Seen in bed. Have sent in Meloxicam and flexeril to take as directed for musculoskeletal pain. Needs OV with primary care provider next week.

## 2023-11-24 ENCOUNTER — Telehealth: Payer: Self-pay

## 2023-11-24 NOTE — Telephone Encounter (Signed)
Referral information sent via Mychart

## 2023-12-07 NOTE — Telephone Encounter (Signed)
N/a. error ?

## 2023-12-15 ENCOUNTER — Encounter: Payer: Self-pay | Admitting: Internal Medicine

## 2023-12-15 ENCOUNTER — Ambulatory Visit (INDEPENDENT_AMBULATORY_CARE_PROVIDER_SITE_OTHER): Payer: 59 | Admitting: Internal Medicine

## 2023-12-15 VITALS — BP 127/86 | HR 100 | Ht 63.0 in | Wt 159.8 lb

## 2023-12-15 DIAGNOSIS — R062 Wheezing: Secondary | ICD-10-CM

## 2023-12-15 DIAGNOSIS — R053 Chronic cough: Secondary | ICD-10-CM | POA: Diagnosis not present

## 2023-12-15 DIAGNOSIS — R0609 Other forms of dyspnea: Secondary | ICD-10-CM | POA: Diagnosis not present

## 2023-12-15 DIAGNOSIS — Z87891 Personal history of nicotine dependence: Secondary | ICD-10-CM

## 2023-12-15 LAB — D-DIMER, QUANTITATIVE: D-Dimer, Quant: <0.19 ug{FEU}/mL (ref <0.50)

## 2023-12-15 NOTE — Progress Notes (Signed)
 OV 12/15/2023  Subjective:  Patient ID: Brandi Kirk, female , DOB: 1986-01-12 , age 38 y.o. , MRN: [id number] , ADDRESS: [address] [place] [address] PCP Early, Sung Amabile, NP Patient Care Team: Early, Sung Amabile, NP as PCP - General (Nurse Practitioner) Carrington Clamp, MD as Consulting Physician (Obstetrics and Gynecology) Zachery Dakins, MD as Referring Physician (Internal Medicine)  This Provider for this visit: Treatment Team:  Attending Provider: Kalman Shan, MD    12/15/2023 -   Chief Complaint  Patient presents with   Pulmonary Consult    Referred by Irene Limbo, FNP.  Pt c/o SOB over the past 3 months. She has SOB with minimal exertion such as just talking.      HPI Brandi Kirk 38 y.o. -this is a new consult referred by primary nurse practitioner.  She states that she is to be morbidly obese and with the help of weight loss drugs particularly is bound she has lost 160 pounds of weight.  She is a Scientist, physiological at [employer].  She states she used to be a heavy alcoholic and then became sober 3-1/2 years ago.  At that point in time she was still smoking but then after having quit alcohol she wanted to quit smoking so she was switched to vaping.  She is continue to VAPE but with the weight loss her vaping has increased particularly in the last 2 years and then 1 year and then 3 months she is VAPING heavily.  In this corresponding time.  In the last 3 months she says she is more short of breath.  She feels very strongly this because of the vaping.  She is short of breath even sitting and watching TV or talking to me but it is worse when she is climbing stairs or exerting.  She says she also gets tachycardic.  Associated this is a cough and wheezing.  She says she is also hearing a crackling noise in the lung [mild auscultation lungs were normal]  She is not on any contraceptive medications or does not have any PE risk factors  She does  admit to seasonal allergies and has a dog and a cat at home.  No formal diagnosis of asthma  Past medical history as listed vocal cord dysfunction in her history.  FENO 7 ppbb   Simple office walk 224 (66+46 x 2) feet Pod A at [facility] x  3 laps goal with forehead probe 12/15/2023    O2 used a   Number laps completed Sit sand stand 15    Comments about pace 28 sec   Resting Pulse Ox/HR 100% and 85/min   Final Pulse Ox/HR 100% and 111/min   Desaturated </= 88% no   Desaturated <= 3% points no   Got Tachycardic >/= 90/min yes   Symptoms at end of test Dyspneic +   Miscellaneous comments Took only 28 sec      CT Chest data from date:   - personally visualized and independently interpreted : NO - my findings are: as below Narrative & Impression  CLINICAL DATA:  Periumbilical abdominal pain   EXAM: CT ABDOMEN AND PELVIS WITHOUT CONTRAST   TECHNIQUE: Multidetector CT imaging of the abdomen and pelvis was performed following the standard protocol without IV contrast.   RADIATION DOSE REDUCTION: This exam was performed according to the departmental dose-optimization program which includes automated exposure control, adjustment of the mA and/or kV according to patient size and/or use of  iterative reconstruction technique.   COMPARISON:  11/02/2008   FINDINGS: Lower chest: No acute abnormality.   Hepatobiliary: No solid liver abnormality is seen. No gallstones, gallbladder wall thickening, or biliary dilatation.   Pancreas: Unremarkable. No pancreatic ductal dilatation or surrounding inflammatory changes.   Spleen: Normal in size without significant abnormality.   Adrenals/Urinary Tract: Adrenal glands are unremarkable. Kidneys are normal, without renal calculi, solid lesion, or hydronephrosis. Bladder is unremarkable.   Stomach/Bowel: Stomach is within normal limits. Appendix normal. No evidence of bowel wall thickening, distention, or inflammatory changes. Moderate  burden of stool throughout the colon.   Vascular/Lymphatic: No significant vascular findings are present. No enlarged abdominal or pelvic lymph nodes.   Reproductive: No mass or other significant abnormality. IUD present in the fundal endometrial cavity.   Other: Fat containing umbilical hernia with some associated internal fat stranding, hernia sac measuring 4.0 cm, hernia neck measuring 1.5 cm (series 3, image 47). No ascites.   Musculoskeletal: No acute or significant osseous findings.   IMPRESSION: 1. Fat containing umbilical hernia with some associated fat stranding, suggestive of incarceration or strangulation and potentially symptomatic. Hernia sac measuring 4.0 cm, hernia neck measuring 1.5 cm. 2. No other acute noncontrast CT findings of the abdomen or pelvis to explain abdominal pain. Normal appendix. 3. Moderate burden of stool throughout the colon.   These results will be called to the ordering clinician or representative by the Radiologist Assistant, and communication documented in the PACS or [employer].     Electronically Signed   By: Jearld Lesch M.D.   On: 01/17/2023 15:02    PFT      No data to display             LAB RESULTS last 96 hours No results found.       has a past medical history of Alcohol addiction (HCC), Alcoholic intoxication with complication (HCC) (11/15/2019), Anemia, Anxiety, Aspiration pneumonitis (HCC) (03/27/2021), B12 deficiency, Back pain (03/18/2016), Bilateral lower extremity edema (11/11/2019), Bipolar 1 disorder (HCC), Blood in stool (07/2017), Chest pain, Chicken pox, Cirrhosis with alcoholism (HCC), Depression, Depression, Fatty liver, Fractured rib (2019), Frequent headaches, Frequent UTI, GERD (gastroesophageal reflux disease), High cholesterol, HPV in female, Hypertension, Hypomagnesemia (11/17/2017), Joint pain, Migraine, Multiple closed fractures of ribs of right Kirk (03/15/2018), Neutrophilic leukocytosis  (03/27/2021), Obesity, Palpitations, PCOS (polycystic ovarian syndrome), Prediabetes, SOB (shortness of breath), Tachycardia (01/12/2019), Vitamin D deficiency, and Vocal cord dysfunction.   reports that she has quit smoking. Her smoking use included cigarettes and e-cigarettes. She has a 3.8 pack-year smoking history. She has never used smokeless tobacco.  Past Surgical History:  Procedure Laterality Date   WISDOM TOOTH EXTRACTION      No Known Allergies  Immunization History  Administered Date(s) Administered   Hepatitis A, Adult 05/02/2020   Hepatitis B, ADULT 04/20/2018   Influenza,inj,Quad PF,6+ Mos 11/26/2016, 06/01/2019, 07/17/2021   Influenza-Unspecified 07/10/2023   Pneumococcal Polysaccharide-23 05/02/2020   Tdap 04/15/2018    Family History  Problem Relation Age of Onset   Asthma Mother    Diabetes Mother    Stroke Mother 47   Hypertension Mother    Heart murmur Mother    Alcohol abuse Mother    Arthritis Mother    Depression Mother    Early death Mother    Hyperlipidemia Mother    Kidney disease Mother    Mental illness Mother    Epilepsy Mother    Asthma Father    Alcohol abuse Father  Depression Father    Mental illness Father    Asthma Sister    Rheum arthritis Sister    Heart murmur Sister    Alcohol abuse Sister    Bipolar disorder Sister    Cirrhosis Sister    Heart murmur Brother    Alcohol abuse Brother    Depression Brother    Mental illness Brother        bipolar   Birth defects Brother    Mental illness Brother        bipolar   Depression Brother    Mental illness Brother    Arthritis Maternal Grandmother    Birth defects Maternal Grandmother    Hyperlipidemia Maternal Grandmother    Hyperlipidemia Maternal Grandfather    Alcohol abuse Maternal Grandfather    Arthritis Maternal Grandfather    Diabetes Maternal Grandfather    Stroke Maternal Grandfather    Arthritis Paternal Grandmother    Arthritis Paternal Grandfather       Current Outpatient Medications:    ALPRAZolam (XANAX) 0.25 MG tablet, Take 0.25 mg by mouth 2 (two) times daily as needed., Disp: , Rfl:    clonazePAM (KLONOPIN) 1 MG disintegrating tablet, Take 1 mg by mouth 2 (two) times daily as needed., Disp: , Rfl:    cyclobenzaprine (FLEXERIL) 10 MG tablet, Take 1 tablet (10 mg total) by mouth 3 (three) times daily as needed for muscle spasms., Disp: 15 tablet, Rfl: 0   levonorgestrel (MIRENA) 20 MCG/24HR IUD, 1 each by Intrauterine route continuous. , Disp: , Rfl:    meloxicam (MOBIC) 15 MG tablet, Take 1 tablet (15 mg total) by mouth daily. Take with a meal, Disp: 30 tablet, Rfl: 0   ondansetron (ZOFRAN-ODT) 4 MG disintegrating tablet, Take 1 tablet (4 mg total) by mouth every 8 (eight) hours as needed for nausea or vomiting., Disp: 30 tablet, Rfl: 1   tirzepatide (ZEPBOUND) 7.5 MG/0.5ML Pen, Inject 7.5 mg into the skin once a week., Disp: 2 mL, Rfl: 0   topiramate (TOPAMAX) 50 MG tablet, Take 1 tablet po BID, Disp: 60 tablet, Rfl: 0   valACYclovir (VALTREX) 1000 MG tablet, 1g PO daily for 5 days, then 1/2 tab po qd x3 days prn, Disp: 30 tablet, Rfl: 1   VRAYLAR 1.5 MG capsule, Take 1.5 mg by mouth daily., Disp: , Rfl:    FLUoxetine (PROZAC) 40 MG capsule, Take 40 mg by mouth daily. (Patient not taking: Reported on 12/15/2023), Disp: , Rfl:       Objective:   Vitals:   12/15/23 1508  BP: 127/86  Pulse: 100  SpO2: 96%  Weight: 159 lb 12.8 oz (72.5 kg)  Height: 5\' 3"  (1.6 m)    Estimated body mass index is 28.31 kg/m as calculated from the following:   Height as of this encounter: 5\' 3"  (1.6 m).   Weight as of this encounter: 159 lb 12.8 oz (72.5 kg).  @WEIGHTCHANGE @  [employer]   12/15/23 1508  Weight: 159 lb 12.8 oz (72.5 kg)     Physical Exam   General: No distress. Looks well O2 at rest: no Cane present: no Sitting in wheel chair: no Frail: no Obese: no Neuro: Alert and Oriented x 3. GCS 15. Speech normal Psych:  Pleasant Resp:  Barrel Chest - no.  Wheeze - no, Crackles - no, No overt respiratory distress CVS: Normal heart sounds. Murmurs - no Ext: Stigmata of Connective Tissue Disease - no HEENT: Normal upper airway. PEERL +. No post nasal  drip        Assessment:       ICD-10-CM   1. DOE (dyspnea on exertion)  R06.09 Nitric oxide    Pulmonary function test    CBC w/Diff    Perennial allergen profile IgE    D-dimer, quantitative    B Nat Peptide    CT Chest High Resolution    2. Chronic cough  R05.3 Nitric oxide    Pulmonary function test    CBC w/Diff    Perennial allergen profile IgE    D-dimer, quantitative    B Nat Peptide    CT Chest High Resolution    3. Wheeze  R06.2 Nitric oxide    Pulmonary function test    CBC w/Diff    Perennial allergen profile IgE    D-dimer, quantitative    B Nat Peptide    CT Chest High Resolution    4. History of nicotine vaping  Z87.891 Pulmonary function test    CBC w/Diff    Perennial allergen profile IgE    D-dimer, quantitative    B Nat Peptide    CT Chest High Resolution         Plan:     Patient Instructions     ICD-10-CM   1. DOE (dyspnea on exertion)  R06.09 Nitric oxide    2. Chronic cough  R05.3 Nitric oxide    3. Wheeze  R06.2 Nitric oxide    4. History of nicotine vaping  Z87.891      Could be vaping related airway issue or at risk for lung injury.  In addition seasonal allergies could be playing a role.  Plan - CBC with differential and blood IgE -Check blood D-dimer and if this is abnormal we will order a CT angiogram chest or VQ scan. - RAST allergy panel -Full pulmonary function test -do CT chest high resolution  Followup  - Return to see Dr. Monica Becton nurse practitioner in the next 8 weeks to discuss test results.   FOLLOWUP Return in about 7 weeks (around 02/02/2024) for with any of the APPS, Face to Face Visit after testing complete.Marland Kitchen    SIGNATURE    Dr. Kalman Shan, M.D., F.C.C.P,   Pulmonary and Critical Care Medicine Staff Physician, [facility] Center Director - Interstitial Lung Disease  Program  Pulmonary Fibrosis [facility] at [facility] [place], [place], [address]  Pager: [phone], If no answer or between  15:00h - 7:00h: call [phone] Telephone: [phone]  3:48 PM 12/15/2023

## 2023-12-15 NOTE — Patient Instructions (Addendum)
 ICD-10-CM   1. DOE (dyspnea on exertion)  R06.09 Nitric oxide    2. Chronic cough  R05.3 Nitric oxide    3. Wheeze  R06.2 Nitric oxide    4. History of nicotine vaping  Z87.891      Could be vaping related airway issue or at risk for lung injury.  In addition seasonal allergies could be playing a role.  Plan - CBC with differential and blood IgE -Check blood D-dimer and if this is abnormal we will order a CT angiogram chest or VQ scan. - RAST allergy panel -Full pulmonary function test -do CT chest high resolution  Followup  - Return to see Dr. Monica Becton nurse practitioner in the next 8 weeks to discuss test results.

## 2023-12-16 LAB — CBC WITH DIFFERENTIAL/PLATELET
Basophils Absolute: 0.1 10*3/uL (ref 0.0–0.1)
Basophils Relative: 0.8 % (ref 0.0–3.0)
Eosinophils Absolute: 0.2 10*3/uL (ref 0.0–0.7)
Eosinophils Relative: 2.2 % (ref 0.0–5.0)
HCT: 39.4 % (ref 36.0–46.0)
Hemoglobin: 13.3 g/dL (ref 12.0–15.0)
Lymphocytes Relative: 33.3 % (ref 12.0–46.0)
Lymphs Abs: 2.4 10*3/uL (ref 0.7–4.0)
MCHC: 33.8 g/dL (ref 30.0–36.0)
MCV: 91.9 fl (ref 78.0–100.0)
Monocytes Absolute: 0.5 10*3/uL (ref 0.1–1.0)
Monocytes Relative: 6.9 % (ref 3.0–12.0)
Neutro Abs: 4.1 10*3/uL (ref 1.4–7.7)
Neutrophils Relative %: 56.8 % (ref 43.0–77.0)
Platelets: 288 10*3/uL (ref 150.0–400.0)
RBC: 4.29 Mil/uL (ref 3.87–5.11)
RDW: 13 % (ref 11.5–15.5)
WBC: 7.2 10*3/uL (ref 4.0–10.5)

## 2023-12-16 LAB — BRAIN NATRIURETIC PEPTIDE: Pro B Natriuretic peptide (BNP): 10 pg/mL (ref 0.0–100.0)

## 2023-12-17 ENCOUNTER — Other Ambulatory Visit: Payer: Self-pay | Admitting: Internal Medicine

## 2023-12-21 ENCOUNTER — Encounter: Payer: Self-pay | Admitting: Nurse Practitioner

## 2023-12-21 ENCOUNTER — Ambulatory Visit: Payer: 59 | Admitting: Nurse Practitioner

## 2023-12-21 VITALS — BP 113/77 | HR 78 | Temp 98.6°F | Ht 63.0 in | Wt 155.0 lb

## 2023-12-21 DIAGNOSIS — Z6827 Body mass index (BMI) 27.0-27.9, adult: Secondary | ICD-10-CM

## 2023-12-21 DIAGNOSIS — R7989 Other specified abnormal findings of blood chemistry: Secondary | ICD-10-CM | POA: Diagnosis not present

## 2023-12-21 DIAGNOSIS — Z13220 Encounter for screening for lipoid disorders: Secondary | ICD-10-CM

## 2023-12-21 DIAGNOSIS — Z79899 Other long term (current) drug therapy: Secondary | ICD-10-CM

## 2023-12-21 DIAGNOSIS — E669 Obesity, unspecified: Secondary | ICD-10-CM

## 2023-12-21 DIAGNOSIS — R632 Polyphagia: Secondary | ICD-10-CM

## 2023-12-21 DIAGNOSIS — E559 Vitamin D deficiency, unspecified: Secondary | ICD-10-CM | POA: Diagnosis not present

## 2023-12-21 LAB — ALLERGEN PROFILE, PERENNIAL ALLERGEN IGE
Alternaria Alternata IgE: <0.1 kU/L
Aspergillus Fumigatus IgE: <0.1 kU/L
Aureobasidi Pullulans IgE: <0.1 kU/L
Candida Albicans IgE: <0.1 kU/L
Cat Dander IgE: <0.1 kU/L
Chicken Feathers IgE: <0.1 kU/L
Cladosporium Herbarum IgE: <0.1 kU/L
Cow Dander IgE: <0.1 kU/L
D Farinae IgE: 0.95 kU/L — AB
D Pteronyssinus IgE: 0.31 kU/L — AB
Dog Dander IgE: <0.1 kU/L
Duck Feathers IgE: <0.1 kU/L
Goose Feathers IgE: <0.1 kU/L
Mouse Urine IgE: <0.1 kU/L
Mucor Racemosus IgE: <0.1 kU/L
Penicillium Chrysogen IgE: <0.1 kU/L
Phoma Betae IgE: <0.1 kU/L
Setomelanomma Rostrat: <0.1 kU/L
Stemphylium Herbarum IgE: <0.1 kU/L

## 2023-12-21 MED ORDER — ZEPBOUND 7.5 MG/0.5ML ~~LOC~~ SOAJ
7.5000 mg | SUBCUTANEOUS | 0 refills | Status: DC
Start: 2023-12-21 — End: 2023-12-21

## 2023-12-21 MED ORDER — TIRZEPATIDE-WEIGHT MANAGEMENT 7.5 MG/0.5ML ~~LOC~~ SOLN: 7.5000 mg | SUBCUTANEOUS | 0 refills | Status: DC

## 2023-12-21 NOTE — Progress Notes (Signed)
 Office: [phone]  /  Fax: [phone]  WEIGHT SUMMARY AND BIOMETRICS  Weight Lost Since Last Visit: 0lb  Weight Gained Since Last Visit: 1lb   Vitals Temp: 98.6 F (37 C) BP: 113/77 Pulse Rate: 78 SpO2: 100 %   Anthropometric Measurements Height: 5\' 3"  (1.6 m) Weight: 155 lb (70.3 kg) BMI (Calculated): 27.46 Weight at Last Visit: 154lb Weight Lost Since Last Visit: 0lb Weight Gained Since Last Visit: 1lb Starting Weight: 262lb Total Weight Loss (lbs): 107 lb (48.5 kg)   Body Composition  Body Fat %: 36.2 % Fat Mass (lbs): 56.4 lbs Muscle Mass (lbs): 94.2 lbs Total Body Water (lbs): 73.2 lbs Visceral Fat Rating : 6   Other Clinical Data Fasting: No Labs: No Today's Visit #: 26 Starting Date: 11/27/21     HPI  Chief Complaint: OBESITY  Brandi Kirk is here to discuss her progress with her obesity treatment plan. She is on the the Category 1 Plan and states she is following her eating plan approximately 85 % of the time. She states she is exercising 0 minutes 0 days per week.   Interval History:  Since last office visit she has gained 1 pound.  She has been under stress at home and has been stress eating.  She is seeing psych/therapist on a regular basis.       Her highest weight was 296 lbs.     Pharmacotherapy for weight loss: She is currently taking Zepbound 7.5mg  for medical weight loss and Topamax 50mg  BID for cravings.  Denies side effects.  Both have helped with polyphagia and cravings.     Previous pharmacotherapy for medical weight loss:   She has tried Phentermine, Zepbound and Qsymia. She stopped Phentermine due to side effects of feeling jittery.    Bariatric surgery:  She has not had bariatric surgery  Elevated ferritin Not currently on a MVI or iron  Elevated Vit B12 Not currently on a MVI, Vit B12 or iron  Vit D deficiency  She is not currently taking a Vit D.   Lab Results  Component Value Date   VD25OH 38.3 06/25/2023    VD25OH 30.6 09/15/2022   VD25OH 33.0 05/30/2022     PHYSICAL EXAM:  Blood pressure 113/77, pulse 78, temperature 98.6 F (37 C), height 5\' 3"  (1.6 m), weight 155 lb (70.3 kg), SpO2 100%. Body mass index is 27.46 kg/m.  General: She is overweight, cooperative, alert, well developed, and in no acute distress. PSYCH: Has normal mood, affect and thought process.   Extremities: No edema.  Neurologic: No gross sensory or motor deficits. No tremors or fasciculations noted.    DIAGNOSTIC DATA REVIEWED:  BMET    Component Value Date/Time   NA 140 06/25/2023 1127   K 4.8 06/25/2023 1127   CL 104 06/25/2023 1127   CO2 21 06/25/2023 1127   GLUCOSE 88 06/25/2023 1127   GLUCOSE 107 (H) 07/17/2021 0943   BUN 17 06/25/2023 1127   CREATININE 0.61 06/25/2023 1127   CREATININE 0.52 11/11/2019 1619   CALCIUM 9.4 06/25/2023 1127   GFRNONAA >60 03/22/2019 2328   GFRAA >60 03/22/2019 2328   Lab Results  Component Value Date   HGBA1C 5.3 05/30/2022   HGBA1C 5.2 11/25/2016   Lab Results  Component Value Date   INSULIN 9.2 11/27/2021   Lab Results  Component Value Date   TSH 0.66 02/12/2023   CBC    Component Value Date/Time   WBC 7.2 12/15/2023 1559   RBC 4.29  12/15/2023 1559   HGB 13.3 12/15/2023 1559   HGB 13.6 06/25/2023 1127   HCT 39.4 12/15/2023 1559   HCT 42.2 06/25/2023 1127   PLT 288.0 12/15/2023 1559   PLT 261 06/25/2023 1127   MCV 91.9 12/15/2023 1559   MCV 95 06/25/2023 1127   MCH 30.5 06/25/2023 1127   MCH 35.0 (H) 11/11/2019 1619   MCHC 33.8 12/15/2023 1559   RDW 13.0 12/15/2023 1559   RDW 11.4 (L) 06/25/2023 1127   Iron Studies    Component Value Date/Time   IRON 117 06/25/2023 1127   TIBC 251 05/30/2022 0953   FERRITIN 363 (H) 06/25/2023 1127   IRONPCTSAT 34 05/30/2022 0953   IRONPCTSAT 23 07/17/2021 0943   Lipid Panel     Component Value Date/Time   CHOL 146 02/12/2023 0000   CHOL 164 09/15/2022 0939   TRIG 61 02/12/2023 0000   HDL 40 02/12/2023  0000   HDL 48 09/15/2022 0939   CHOLHDL 4.7 (H) 05/30/2022 0953   CHOLHDL 5 05/02/2020 1022   VLDL 36.4 05/02/2020 1022   LDLCALC 93 02/12/2023 0000   LDLCALC 105 (H) 09/15/2022 0939   Hepatic Function Panel     Component Value Date/Time   PROT 7.2 06/25/2023 1127   ALBUMIN 4.8 06/25/2023 1127   AST 16 06/25/2023 1127   ALT 21 06/25/2023 1127   ALT 361 11/23/2017 0000   ALKPHOS 59 06/25/2023 1127   BILITOT 1.1 06/25/2023 1127   BILIDIR 0.1 11/11/2021 0947      Component Value Date/Time   TSH 0.66 02/12/2023 0000   TSH 1.260 05/30/2022 0953   Nutritional Lab Results  Component Value Date   VD25OH 38.3 06/25/2023   VD25OH 30.6 09/15/2022   VD25OH 33.0 05/30/2022     ASSESSMENT AND PLAN  TREATMENT PLAN FOR OBESITY:  Recommended Dietary Goals She has agreed to continue on maintenance.    Behavioral Intervention  We discussed the following Behavioral Modification Strategies today: increasing lean protein intake to established goals, decreasing simple carbohydrates , increasing vegetables, increasing fiber rich foods, avoiding skipping meals, increasing water intake , work on meal planning and preparation, reading food labels , keeping healthy foods at home, and continue to work on maintaining a reduced calorie state, getting the recommended amount of protein, incorporating whole foods, making healthy choices, staying well hydrated and practicing mindfulness when eating..  Additional resources provided today: NA  Recommended Physical Activity Goals  Brandi Kirk has been advised to work up to 150 minutes of moderate intensity aerobic activity a week and strengthening exercises 2-3 times per week for cardiovascular health, weight loss maintenance and preservation of muscle mass.   She has agreed to Think about enjoyable ways to increase daily physical activity and overcoming barriers to exercise, Increase physical activity in their day and reduce sedentary time (increase NEAT).,  and Work on scheduling and tracking physical activity.    Pharmacotherapy We discussed various medication options to help Brandi Kirk with her weight loss efforts and we both agreed to continue Zepbound 7.5mg . Will change to mail order due to cost.  ASSOCIATED CONDITIONS ADDRESSED TODAY  Action/Plan  Elevated ferritin -     Ferritin  Elevated vitamin B12 level -     Vitamin B12  Vitamin D deficiency -     VITAMIN D 25 Hydroxy (Vit-D Deficiency, Fractures)  Polyphagia -     Tirzepatide-Weight Management; Inject 7.5 mg into the skin once a week.  Dispense: 2 mL; Refill: 0  Screening for hyperlipidemia -  Lipid Panel With LDL/HDL Ratio  Medication management -     Comprehensive metabolic panel -     TSH  Generalized obesity -     TSH -     Tirzepatide-Weight Management; Inject 7.5 mg into the skin once a week.  Dispense: 2 mL; Refill: 0  BMI 27.0-27.9,adult         Return in about 4 weeks (around 01/18/2024).Marland Kitchen She was informed of the importance of frequent follow up visits to maximize her success with intensive lifestyle modifications for her multiple health conditions.   ATTESTASTION STATEMENTS:  Reviewed by clinician on day of visit: allergies, medications, problem list, medical history, surgical history, family history, social history, and previous encounter notes.    Theodis Sato. Edina Winningham FNP-C

## 2023-12-22 ENCOUNTER — Encounter: Payer: Self-pay | Admitting: Nurse Practitioner

## 2023-12-22 LAB — VITAMIN B12: Vitamin B-12: 503 pg/mL (ref 232–1245)

## 2023-12-22 LAB — COMPREHENSIVE METABOLIC PANEL
ALT: 25 IU/L (ref 0–32)
AST: 16 IU/L (ref 0–40)
Albumin: 4.7 g/dL (ref 3.9–4.9)
Alkaline Phosphatase: 61 IU/L (ref 44–121)
BUN/Creatinine Ratio: 28 — ABNORMAL HIGH (ref 9–23)
BUN: 20 mg/dL (ref 6–20)
Bilirubin Total: 0.8 mg/dL (ref 0.0–1.2)
CO2: 20 mmol/L (ref 20–29)
Calcium: 9.1 mg/dL (ref 8.7–10.2)
Chloride: 104 mmol/L (ref 96–106)
Creatinine, Ser: 0.71 mg/dL (ref 0.57–1.00)
Globulin, Total: 2.4 g/dL (ref 1.5–4.5)
Glucose: 85 mg/dL (ref 70–99)
Potassium: 4.5 mmol/L (ref 3.5–5.2)
Sodium: 137 mmol/L (ref 134–144)
Total Protein: 7.1 g/dL (ref 6.0–8.5)
eGFR: 114 mL/min/{1.73_m2} (ref >59)

## 2023-12-22 LAB — FERRITIN: Ferritin: 329 ng/mL — ABNORMAL HIGH (ref 15–150)

## 2023-12-22 LAB — VITAMIN D 25 HYDROXY (VIT D DEFICIENCY, FRACTURES): Vit D, 25-Hydroxy: 32.1 ng/mL (ref 30.0–100.0)

## 2023-12-22 LAB — LIPID PANEL WITH LDL/HDL RATIO
Cholesterol, Total: 134 mg/dL (ref 100–199)
HDL: 42 mg/dL (ref >39)
LDL Chol Calc (NIH): 81 mg/dL (ref 0–99)
LDL/HDL Ratio: 1.9 ratio (ref 0.0–3.2)
Triglycerides: 49 mg/dL (ref 0–149)
VLDL Cholesterol Cal: 11 mg/dL (ref 5–40)

## 2023-12-22 LAB — TSH: TSH: 1.07 u[IU]/mL (ref 0.450–4.500)

## 2024-01-08 ENCOUNTER — Other Ambulatory Visit

## 2024-01-11 ENCOUNTER — Other Ambulatory Visit: Payer: Self-pay | Admitting: Nurse Practitioner

## 2024-01-11 DIAGNOSIS — R632 Polyphagia: Secondary | ICD-10-CM

## 2024-01-11 DIAGNOSIS — E669 Obesity, unspecified: Secondary | ICD-10-CM

## 2024-02-17 ENCOUNTER — Encounter: Payer: Self-pay | Admitting: Primary Care

## 2024-03-09 ENCOUNTER — Ambulatory Visit: Admitting: Nurse Practitioner

## 2024-03-09 ENCOUNTER — Encounter: Payer: Self-pay | Admitting: Nurse Practitioner

## 2024-03-09 VITALS — BP 116/79 | HR 89 | Temp 98.3°F | Ht 63.0 in | Wt 169.0 lb

## 2024-03-09 DIAGNOSIS — Z6829 Body mass index (BMI) 29.0-29.9, adult: Secondary | ICD-10-CM

## 2024-03-09 DIAGNOSIS — R7989 Other specified abnormal findings of blood chemistry: Secondary | ICD-10-CM

## 2024-03-09 DIAGNOSIS — R632 Polyphagia: Secondary | ICD-10-CM

## 2024-03-09 DIAGNOSIS — E669 Obesity, unspecified: Secondary | ICD-10-CM

## 2024-03-09 MED ORDER — TIRZEPATIDE-WEIGHT MANAGEMENT 10 MG/0.5ML ~~LOC~~ SOLN: 10.0000 mg | SUBCUTANEOUS | 0 refills | Status: DC

## 2024-03-09 NOTE — Progress Notes (Signed)
 Office: [phone]  /  Fax: [phone]  WEIGHT SUMMARY AND BIOMETRICS  Weight Lost Since Last Visit: 0lb  Weight Gained Since Last Visit: 14lb   Vitals Temp: 98.3 F (36.8 C) BP: 116/79 Pulse Rate: 89 SpO2: 100 %   Anthropometric Measurements Height: 5' 3 (1.6 m) Weight: 169 lb (76.7 kg) BMI (Calculated): 29.94 Weight at Last Visit: 155lb Weight Lost Since Last Visit: 0lb Weight Gained Since Last Visit: 14lb Starting Weight: 262lb Total Weight Loss (lbs): 93 lb (42.2 kg)   Body Composition  Body Fat %: 38.9 % Fat Mass (lbs): 65.8 lbs Muscle Mass (lbs): 98.2 lbs Total Body Water (lbs): 75.4 lbs Visceral Fat Rating : 7   Other Clinical Data Fasting: No Labs: No Today's Visit #: 27 Starting Date: 11/27/21     HPI  Chief Complaint: OBESITY  Brandi Kirk is here to discuss her progress with her obesity treatment plan. She is on the the Category 1 Plan and states she is following her eating plan approximately 30 % of the time. She states she is exercising 0 minutes 0 days per week.   Interval History:  Since last office visit on 12/21/23 she has gained 14 pounds.  She is under a lot of stress.  Her brother in law passed away.  She was helping to care for him and was traveling back and forth for several weeks.  Was eating out more.  She is struggling with polyphagia and cravings especially for sugar.  She is drinking water with flavoring.    Pharmacotherapy for weight loss: She is currently taking Zepbound  7.5mg  for medical weight loss.  She stopped taking Topamax  50mg  BID for cravings.  Was started on Lamicital 25mg  and is going to increase to 50mg  in 9 days by psychiatrist.  Denies side effects.       Previous pharmacotherapy for medical weight loss:   She has tried Phentermine, Zepbound  and Qsymia . She stopped Phentermine due to side effects of feeling jittery.    Bariatric surgery:  She has not had bariatric surgery  Elevated ferritin Not taking a MVI or  iron  PHYSICAL EXAM:  Blood pressure 116/79, pulse 89, temperature 98.3 F (36.8 C), height 5' 3 (1.6 m), weight 169 lb (76.7 kg), SpO2 100%. Body mass index is 29.94 kg/m.  General: She is overweight, cooperative, alert, well developed, and in no acute distress. PSYCH: Has normal mood, affect and thought process.   Extremities: No edema.  Neurologic: No gross sensory or motor deficits. No tremors or fasciculations noted.    DIAGNOSTIC DATA REVIEWED:  BMET    Component Value Date/Time   NA 137 12/21/2023 0748   K 4.5 12/21/2023 0748   CL 104 12/21/2023 0748   CO2 20 12/21/2023 0748   GLUCOSE 85 12/21/2023 0748   GLUCOSE 107 (H) 07/17/2021 0943   BUN 20 12/21/2023 0748   CREATININE 0.71 12/21/2023 0748   CREATININE 0.52 11/11/2019 1619   CALCIUM 9.1 12/21/2023 0748   GFRNONAA >60 03/22/2019 2328   GFRAA >60 03/22/2019 2328   Lab Results  Component Value Date   HGBA1C 5.3 05/30/2022   HGBA1C 5.2 11/25/2016   Lab Results  Component Value Date   INSULIN  9.2 11/27/2021   Lab Results  Component Value Date   TSH 1.070 12/21/2023   CBC    Component Value Date/Time   WBC 7.2 12/15/2023 1559   RBC 4.29 12/15/2023 1559   HGB 13.3 12/15/2023 1559   HGB 13.6 06/25/2023 1127  HCT 39.4 12/15/2023 1559   HCT 42.2 06/25/2023 1127   PLT 288.0 12/15/2023 1559   PLT 261 06/25/2023 1127   MCV 91.9 12/15/2023 1559   MCV 95 06/25/2023 1127   MCH 30.5 06/25/2023 1127   MCH 35.0 (H) 11/11/2019 1619   MCHC 33.8 12/15/2023 1559   RDW 13.0 12/15/2023 1559   RDW 11.4 (L) 06/25/2023 1127   Iron Studies    Component Value Date/Time   IRON 117 06/25/2023 1127   TIBC 251 05/30/2022 0953   FERRITIN 329 (H) 12/21/2023 0748   IRONPCTSAT 34 05/30/2022 0953   IRONPCTSAT 23 07/17/2021 0943   Lipid Panel     Component Value Date/Time   CHOL 134 12/21/2023 0748   TRIG 49 12/21/2023 0748   HDL 42 12/21/2023 0748   CHOLHDL 4.7 (H) 05/30/2022 0953   CHOLHDL 5 05/02/2020 1022    VLDL 36.4 05/02/2020 1022   LDLCALC 81 12/21/2023 0748   Hepatic Function Panel     Component Value Date/Time   PROT 7.1 12/21/2023 0748   ALBUMIN 4.7 12/21/2023 0748   AST 16 12/21/2023 0748   ALT 25 12/21/2023 0748   ALT 361 11/23/2017 0000   ALKPHOS 61 12/21/2023 0748   BILITOT 0.8 12/21/2023 0748   BILIDIR 0.1 11/11/2021 0947      Component Value Date/Time   TSH 1.070 12/21/2023 0748   Nutritional Lab Results  Component Value Date   VD25OH 32.1 12/21/2023   VD25OH 38.3 06/25/2023   VD25OH 30.6 09/15/2022     ASSESSMENT AND PLAN  TREATMENT PLAN FOR OBESITY:  Recommended Dietary Goals  [place] is currently in the action stage of change. As such, her goal is to continue weight management plan. She has agreed to the Category 1 Plan.  Behavioral Intervention  We discussed the following Behavioral Modification Strategies today: increasing lean protein intake to established goals, decreasing simple carbohydrates , increasing vegetables, increasing fiber rich foods, increasing water intake , and continue to work on maintaining a reduced calorie state, getting the recommended amount of protein, incorporating whole foods, making healthy choices, staying well hydrated and practicing mindfulness when eating..  Additional resources provided today: NA  Recommended Physical Activity Goals  Sole has been advised to work up to 150 minutes of moderate intensity aerobic activity a week and strengthening exercises 2-3 times per week for cardiovascular health, weight loss maintenance and preservation of muscle mass.   She has agreed to Think about enjoyable ways to increase daily physical activity and overcoming barriers to exercise, Increase physical activity in their day and reduce sedentary time (increase NEAT)., and continue to gradually increase the amount and intensity of exercise routine   Pharmacotherapy We discussed various medication options to help [name] with her weight  loss efforts and we both agreed to increase Zepbound  10mg .  Side effects discussed.  ASSOCIATED CONDITIONS ADDRESSED TODAY  Action/Plan  Elevated ferritin -     Ambulatory referral to Hematology / Oncology  Polyphagia -     Tirzepatide -Weight Management; Inject 10 mg into the skin once a week.  Dispense: 2 mL; Refill: 0  Generalized obesity -     Tirzepatide -Weight Management; Inject 10 mg into the skin once a week.  Dispense: 2 mL; Refill: 0  BMI 29.0-29.9,adult     Labs reviewed in chart with patient from 12/21/23  Continue to follow up with psychiatry    Return in about 4 weeks (around 04/06/2024).Aaron Aas She was informed of the importance of frequent follow up visits  to maximize her success with intensive lifestyle modifications for her multiple health conditions.   ATTESTASTION STATEMENTS:  Reviewed by clinician on day of visit: allergies, medications, problem list, medical history, surgical history, family history, social history, and previous encounter notes.     Crist Dominion. Falana Clagg FNP-C

## 2024-03-14 ENCOUNTER — Ambulatory Visit: Admitting: Nurse Practitioner

## 2024-03-14 ENCOUNTER — Encounter

## 2024-03-31 ENCOUNTER — Encounter

## 2024-04-05 ENCOUNTER — Encounter: Payer: Self-pay | Admitting: Nurse Practitioner

## 2024-04-05 ENCOUNTER — Ambulatory Visit (INDEPENDENT_AMBULATORY_CARE_PROVIDER_SITE_OTHER): Admitting: Nurse Practitioner

## 2024-04-05 VITALS — BP 109/71 | HR 75 | Temp 99.1°F | Ht 63.0 in | Wt 160.0 lb

## 2024-04-05 DIAGNOSIS — R632 Polyphagia: Secondary | ICD-10-CM

## 2024-04-05 DIAGNOSIS — Z6828 Body mass index (BMI) 28.0-28.9, adult: Secondary | ICD-10-CM

## 2024-04-05 DIAGNOSIS — E669 Obesity, unspecified: Secondary | ICD-10-CM

## 2024-04-05 MED ORDER — TIRZEPATIDE-WEIGHT MANAGEMENT 10 MG/0.5ML ~~LOC~~ SOLN
10.0000 mg | SUBCUTANEOUS | 0 refills | Status: DC
Start: 2024-04-05 — End: 2024-05-12

## 2024-04-05 NOTE — Progress Notes (Signed)
 Office: [phone]  /  Fax: [phone]  WEIGHT SUMMARY AND BIOMETRICS  Weight Lost Since Last Visit: 9lb  Weight Gained Since Last Visit: 0   Vitals Temp: 99.1 F (37.3 C) BP: 109/71 Pulse Rate: 75 SpO2: 97 %   Anthropometric Measurements Height: 5' 3 (1.6 m) Weight: 160 lb (72.6 kg) BMI (Calculated): 28.35 Weight at Last Visit: 169lb Weight Lost Since Last Visit: 9lb Weight Gained Since Last Visit: 0 Starting Weight: 262lb Total Weight Loss (lbs): 102 lb (46.3 kg)   Body Composition  Body Fat %: 36 % Fat Mass (lbs): 57.8 lbs Muscle Mass (lbs): 97.6 lbs Total Body Water (lbs): 72.8 lbs Visceral Fat Rating : 6   Other Clinical Data Fasting: no Labs: no Today's Visit #: 28 Starting Date: 11/27/21     HPI  Chief Complaint: OBESITY  Brandi Kirk is here to discuss her progress with her obesity treatment plan. She is on the the Category 1 Plan and states she is following her eating plan approximately 75 % of the time. She states she is exercising 20 minutes 2 days per week.   Interval History:  Since last office visit she has lost 9 pounds. She has been meal prepping.  She is aiming to eat more protein. She is snacking on chicken strips, beef jerky, almonds 3 times per week, cheese, egg bites and protein muffins.  She is eating 2 vegetarian meals per week-legumes, beans and protein pasta. She has been working with the RD at her work. She is drinking water, occ protein drink and diet sodas.   She started exercising 3 weeks ago.    Pharmacotherapy for weight loss: She is currently taking Zepbound  10 mg for medical weight loss.  Denies side effects.  Has helped overall with polyphagia and cravings.     Previous pharmacotherapy for medical weight loss:   She has tried Phentermine, Zepbound  and Qsymia . She stopped Phentermine due to side effects of feeling jittery.    Bariatric surgery:  She has not had bariatric surgery     PHYSICAL EXAM:  Blood pressure  109/71, pulse 75, temperature 99.1 F (37.3 C), height 5' 3 (1.6 m), weight 160 lb (72.6 kg), SpO2 97%. Body mass index is 28.34 kg/m.  General: She is overweight, cooperative, alert, well developed, and in no acute distress. PSYCH: Has normal mood, affect and thought process.   Extremities: No edema.  Neurologic: No gross sensory or motor deficits. No tremors or fasciculations noted.    DIAGNOSTIC DATA REVIEWED:  BMET    Component Value Date/Time   NA 137 12/21/2023 0748   K 4.5 12/21/2023 0748   CL 104 12/21/2023 0748   CO2 20 12/21/2023 0748   GLUCOSE 85 12/21/2023 0748   GLUCOSE 107 (H) 07/17/2021 0943   BUN 20 12/21/2023 0748   CREATININE 0.71 12/21/2023 0748   CREATININE 0.52 11/11/2019 1619   CALCIUM 9.1 12/21/2023 0748   GFRNONAA >60 03/22/2019 2328   GFRAA >60 03/22/2019 2328   Lab Results  Component Value Date   HGBA1C 5.3 05/30/2022   HGBA1C 5.2 11/25/2016   Lab Results  Component Value Date   INSULIN  9.2 11/27/2021   Lab Results  Component Value Date   TSH 1.070 12/21/2023   CBC    Component Value Date/Time   WBC 7.2 12/15/2023 1559   RBC 4.29 12/15/2023 1559   HGB 13.3 12/15/2023 1559   HGB 13.6 06/25/2023 1127   HCT 39.4 12/15/2023 1559   HCT 42.2 06/25/2023 1127  PLT 288.0 12/15/2023 1559   PLT 261 06/25/2023 1127   MCV 91.9 12/15/2023 1559   MCV 95 06/25/2023 1127   MCH 30.5 06/25/2023 1127   MCH 35.0 (H) 11/11/2019 1619   MCHC 33.8 12/15/2023 1559   RDW 13.0 12/15/2023 1559   RDW 11.4 (L) 06/25/2023 1127   Iron Studies    Component Value Date/Time   IRON 117 06/25/2023 1127   TIBC 251 05/30/2022 0953   FERRITIN 329 (H) 12/21/2023 0748   IRONPCTSAT 34 05/30/2022 0953   IRONPCTSAT 23 07/17/2021 0943   Lipid Panel     Component Value Date/Time   CHOL 134 12/21/2023 0748   TRIG 49 12/21/2023 0748   HDL 42 12/21/2023 0748   CHOLHDL 4.7 (H) 05/30/2022 0953   CHOLHDL 5 05/02/2020 1022   VLDL 36.4 05/02/2020 1022   LDLCALC 81  12/21/2023 0748   Hepatic Function Panel     Component Value Date/Time   PROT 7.1 12/21/2023 0748   ALBUMIN 4.7 12/21/2023 0748   AST 16 12/21/2023 0748   ALT 25 12/21/2023 0748   ALT 361 11/23/2017 0000   ALKPHOS 61 12/21/2023 0748   BILITOT 0.8 12/21/2023 0748   BILIDIR 0.1 11/11/2021 0947      Component Value Date/Time   TSH 1.070 12/21/2023 0748   Nutritional Lab Results  Component Value Date   VD25OH 32.1 12/21/2023   VD25OH 38.3 06/25/2023   VD25OH 30.6 09/15/2022     ASSESSMENT AND PLAN  TREATMENT PLAN FOR OBESITY:  Recommended Dietary Goals  Brandi Kirk is currently in the action stage of change. As such, her goal is to continue weight management plan. She has agreed to practicing portion control and making smarter food choices, such as increasing vegetables and decreasing simple carbohydrates.  Behavioral Intervention  We discussed the following Behavioral Modification Strategies today: increasing lean protein intake to established goals, decreasing simple carbohydrates , increasing vegetables, increasing fiber rich foods, increasing water intake , and continue to work on maintaining a reduced calorie state, getting the recommended amount of protein, incorporating whole foods, making healthy choices, staying well hydrated and practicing mindfulness when eating..  Additional resources provided today: NA  Recommended Physical Activity Goals  Brandi Kirk has been advised to work up to 150 minutes of moderate intensity aerobic activity a week and strengthening exercises 2-3 times per week for cardiovascular health, weight loss maintenance and preservation of muscle mass.   She has agreed to Think about enjoyable ways to increase daily physical activity and overcoming barriers to exercise, Increase physical activity in their day and reduce sedentary time (increase NEAT)., and continue to gradually increase the amount and intensity of exercise routine   Pharmacotherapy We  discussed various medication options to help Brandi Kirk with her weight loss efforts and we both agreed to continue Zepbound  10mg .  Side effects discussed.  ASSOCIATED CONDITIONS ADDRESSED TODAY  Action/Plan  Polyphagia -     Tirzepatide -Weight Management; Inject 10 mg into the skin once a week.  Dispense: 2 mL; Refill: 0  Generalized obesity -     Tirzepatide -Weight Management; Inject 10 mg into the skin once a week.  Dispense: 2 mL; Refill: 0  BMI 28.0-28.9,adult         Return in about 4 weeks (around 05/03/2024).SABRA She was informed of the importance of frequent follow up visits to maximize her success with intensive lifestyle modifications for her multiple health conditions.   ATTESTASTION STATEMENTS:  Reviewed by clinician on day of visit: allergies, medications, problem  list, medical history, surgical history, family history, social history, and previous encounter notes.     Corean SAUNDERS. Donnie Panik FNP-C

## 2024-04-13 ENCOUNTER — Ambulatory Visit: Admitting: Primary Care

## 2024-04-22 ENCOUNTER — Other Ambulatory Visit: Payer: Self-pay | Admitting: Nurse Practitioner

## 2024-04-22 DIAGNOSIS — R632 Polyphagia: Secondary | ICD-10-CM

## 2024-04-22 DIAGNOSIS — E669 Obesity, unspecified: Secondary | ICD-10-CM

## 2024-05-12 ENCOUNTER — Ambulatory Visit (INDEPENDENT_AMBULATORY_CARE_PROVIDER_SITE_OTHER): Admitting: Nurse Practitioner

## 2024-05-12 ENCOUNTER — Encounter: Payer: Self-pay | Admitting: Nurse Practitioner

## 2024-05-12 VITALS — BP 105/71 | HR 83 | Temp 98.8°F | Ht 63.0 in | Wt 159.0 lb

## 2024-05-12 DIAGNOSIS — E669 Obesity, unspecified: Secondary | ICD-10-CM | POA: Diagnosis not present

## 2024-05-12 DIAGNOSIS — R632 Polyphagia: Secondary | ICD-10-CM

## 2024-05-12 DIAGNOSIS — Z6828 Body mass index (BMI) 28.0-28.9, adult: Secondary | ICD-10-CM | POA: Diagnosis not present

## 2024-05-12 MED ORDER — TIRZEPATIDE-WEIGHT MANAGEMENT 10 MG/0.5ML ~~LOC~~ SOLN: 10.0000 mg | SUBCUTANEOUS | 0 refills | Status: DC

## 2024-05-12 NOTE — Progress Notes (Addendum)
 Office: [phone]  /  Fax: [phone]  WEIGHT SUMMARY AND BIOMETRICS  Weight Lost Since Last Visit: 1lb  Weight Gained Since Last Visit: 0lb   Vitals Temp: 98.8 F (37.1 C) BP: 105/71 Pulse Rate: 83 SpO2: 100 %   Anthropometric Measurements Height: 5' 3 (1.6 m) Weight: 159 lb (72.1 kg) BMI (Calculated): 28.17 Weight at Last Visit: 160lb Weight Lost Since Last Visit: 1lb Weight Gained Since Last Visit: 0lb Starting Weight: 262lb Total Weight Loss (lbs): 103 lb (46.7 kg)   Body Composition  Body Fat %: 36 % Fat Mass (lbs): 57.4 lbs Muscle Mass (lbs): 97 lbs Total Body Water (lbs): 73.4 lbs Visceral Fat Rating : 6   Other Clinical Data Fasting: No Labs: No Today's Visit #: 29 Starting Date: 11/27/21     HPI  Chief Complaint: OBESITY  Brandi Kirk is here to discuss her progress with her obesity treatment plan. She is on the the Category 1 Plan and states she is following her eating plan approximately 75 % of the time. She states she is exercising 0 minutes 0 days per week.   Interval History:  Since last office visit she has lost 1 pound.  She is following a vegetation meal plan 2 days per week and then cat 1 the rest of the week.  She is trying to get in more vegetables and fiber in her diet.  She is not currently exercising.    Her highest weight was 296 lbs.   Pharmacotherapy for weight loss: She is currently taking Zepbound 10 mg for medical weight loss.  Reports side effects of constipation.  Has continued to help with polyphagia and cravings.     Previous pharmacotherapy for medical weight loss:    -She has tried Phentermine, Zepbound and Qsymia. She stopped Phentermine due to side effects of feeling jittery.    Bariatric surgery:  She has not had bariatric surgery    PHYSICAL EXAM:  Blood pressure 105/71, pulse 83, temperature 98.8 F (37.1 C), height 5' 3 (1.6 m), weight 159 lb (72.1 kg), SpO2 100%. Body mass index is 28.17  kg/m.  General: She is overweight, cooperative, alert, well developed, and in no acute distress. PSYCH: Has normal mood, affect and thought process.   Extremities: No edema.  Neurologic: No gross sensory or motor deficits. No tremors or fasciculations noted.    DIAGNOSTIC DATA REVIEWED:  BMET    Component Value Date/Time   NA 137 12/21/2023 0748   K 4.5 12/21/2023 0748   CL 104 12/21/2023 0748   CO2 20 12/21/2023 0748   GLUCOSE 85 12/21/2023 0748   GLUCOSE 107 (H) 07/17/2021 0943   BUN 20 12/21/2023 0748   CREATININE 0.71 12/21/2023 0748   CREATININE 0.52 11/11/2019 1619   CALCIUM 9.1 12/21/2023 0748   GFRNONAA >60 03/22/2019 2328   GFRAA >60 03/22/2019 2328   Lab Results  Component Value Date   HGBA1C 5.3 05/30/2022   HGBA1C 5.2 11/25/2016   Lab Results  Component Value Date   INSULIN 9.2 11/27/2021   Lab Results  Component Value Date   TSH 1.070 12/21/2023   CBC    Component Value Date/Time   WBC 7.2 12/15/2023 1559   RBC 4.29 12/15/2023 1559   HGB 13.3 12/15/2023 1559   HGB 13.6 06/25/2023 1127   HCT 39.4 12/15/2023 1559   HCT 42.2 06/25/2023 1127   PLT 288.0 12/15/2023 1559   PLT 261 06/25/2023 1127   MCV 91.9 12/15/2023 1559   MCV  95 06/25/2023 1127   MCH 30.5 06/25/2023 1127   MCH 35.0 (H) 11/11/2019 1619   MCHC 33.8 12/15/2023 1559   RDW 13.0 12/15/2023 1559   RDW 11.4 (L) 06/25/2023 1127   Iron Studies    Component Value Date/Time   IRON 117 06/25/2023 1127   TIBC 251 05/30/2022 0953   FERRITIN 329 (H) 12/21/2023 0748   IRONPCTSAT 34 05/30/2022 0953   IRONPCTSAT 23 07/17/2021 0943   Lipid Panel     Component Value Date/Time   CHOL 134 12/21/2023 0748   TRIG 49 12/21/2023 0748   HDL 42 12/21/2023 0748   CHOLHDL 4.7 (H) 05/30/2022 0953   CHOLHDL 5 05/02/2020 1022   VLDL 36.4 05/02/2020 1022   LDLCALC 81 12/21/2023 0748   Hepatic Function Panel     Component Value Date/Time   PROT 7.1 12/21/2023 0748   ALBUMIN 4.7 12/21/2023 0748    AST 16 12/21/2023 0748   ALT 25 12/21/2023 0748   ALT 361 11/23/2017 0000   ALKPHOS 61 12/21/2023 0748   BILITOT 0.8 12/21/2023 0748   BILIDIR 0.1 11/11/2021 0947      Component Value Date/Time   TSH 1.070 12/21/2023 0748   Nutritional Lab Results  Component Value Date   VD25OH 32.1 12/21/2023   VD25OH 38.3 06/25/2023   VD25OH 30.6 09/15/2022     ASSESSMENT AND PLAN  TREATMENT PLAN FOR OBESITY:  Recommended Dietary Goals  Brandi Kirk is currently in the action stage of change. As such, her goal is to continue weight management plan. She has agreed to the Category 1 Plan.  Behavioral Intervention  We discussed the following Behavioral Modification Strategies today: increasing lean protein intake to established goals, decreasing simple carbohydrates , increasing vegetables, increasing fiber rich foods, increasing water intake , work on meal planning and preparation, reading food labels , keeping healthy foods at home, and continue to work on maintaining a reduced calorie state, getting the recommended amount of protein, incorporating whole foods, making healthy choices, staying well hydrated and practicing mindfulness when eating..  Additional resources provided today: NA  Recommended Physical Activity Goals  Brandi Kirk has been advised to work up to 150 minutes of moderate intensity aerobic activity a week and strengthening exercises 2-3 times per week for cardiovascular health, weight loss maintenance and preservation of muscle mass.   She has agreed to Think about enjoyable ways to increase daily physical activity and overcoming barriers to exercise, Increase physical activity in their day and reduce sedentary time (increase NEAT)., and Work on scheduling and tracking physical activity.    Discussed the importance of resistance training/core/cardio.   Pharmacotherapy We discussed various medication options to help Brandi Kirk with her weight loss efforts and we both agreed to  continue Zepbound 10mg .  Side effects discussed.  ASSOCIATED CONDITIONS ADDRESSED TODAY  Action/Plan  Polyphagia -     Tirzepatide-Weight Management; Inject 10 mg into the skin once a week.  Dispense: 2 mL; Refill: 0  Generalized obesity -     Tirzepatide-Weight Management; Inject 10 mg into the skin once a week.  Dispense: 2 mL; Refill: 0  BMI 28.0-28.9,adult         Return in about 4 weeks (around 06/09/2024).SABRA She was informed of the importance of frequent follow up visits to maximize her success with intensive lifestyle modifications for her multiple health conditions.   ATTESTASTION STATEMENTS:  Reviewed by clinician on day of visit: allergies, medications, problem list, medical history, surgical history, family history, social history, and previous  encounter notes.   Corean SAUNDERS. Brandi Brunetto FNP-C

## 2024-06-02 ENCOUNTER — Other Ambulatory Visit: Payer: Self-pay | Admitting: Nurse Practitioner

## 2024-06-02 DIAGNOSIS — R632 Polyphagia: Secondary | ICD-10-CM

## 2024-06-02 DIAGNOSIS — E669 Obesity, unspecified: Secondary | ICD-10-CM

## 2024-06-07 ENCOUNTER — Encounter: Payer: Self-pay | Admitting: Nurse Practitioner

## 2024-06-08 ENCOUNTER — Telehealth (INDEPENDENT_AMBULATORY_CARE_PROVIDER_SITE_OTHER): Admitting: Nurse Practitioner

## 2024-06-08 ENCOUNTER — Encounter: Payer: Self-pay | Admitting: Nurse Practitioner

## 2024-06-08 DIAGNOSIS — R632 Polyphagia: Secondary | ICD-10-CM | POA: Diagnosis not present

## 2024-06-08 DIAGNOSIS — E669 Obesity, unspecified: Secondary | ICD-10-CM

## 2024-06-08 DIAGNOSIS — Z6829 Body mass index (BMI) 29.0-29.9, adult: Secondary | ICD-10-CM

## 2024-06-08 DIAGNOSIS — R638 Other symptoms and signs concerning food and fluid intake: Secondary | ICD-10-CM | POA: Diagnosis not present

## 2024-06-08 MED ORDER — BUPROPION HCL ER (SR) 150 MG PO TB12: 150.0000 mg | ORAL_TABLET | Freq: Every day | ORAL | 0 refills | Status: DC

## 2024-06-08 MED ORDER — ZEPBOUND 12.5 MG/0.5ML ~~LOC~~ SOLN: 12.5000 mg | SUBCUTANEOUS | 0 refills | Status: DC

## 2024-06-08 NOTE — Progress Notes (Signed)
 Office: [phone]  /  Fax: [phone]  WEIGHT SUMMARY AND BIOMETRICS  TeleHealth Visit:  This visit was completed with telemedicine (audio/video) technology. Brandi Kirk has verbally consented to this TeleHealth visit. The patient is located at home, the provider is located at [facility]. The participants in this visit include the listed provider and patient. The visit was conducted today via MyChart video.   HPI  Chief Complaint: OBESITY  Brandi Kirk is here to discuss her progress with her obesity treatment plan. She is on the the Category 1 Plan and states she is following her eating plan approximately 0 % of the time. She states she is exercising 0 minutes 0 days per week.   Interval History:  Reported weight:  165 lbs  She stopped smoking and vaping 2 weeks ago and she is struggling with polyphagia and cravings.  She has been eating more and is eating more sweets.  She is trying to eat sugar free foods and has been drinking more diet sodas.  She is drinking  more water.   Her highest weight was 296 lbs.    Pharmacotherapy for weight loss: She is currently taking Zepbound  10 mg for medical weight loss.  Denies side effects.     Previous pharmacotherapy for medical weight loss:    -She has tried Phentermine, Zepbound  and Qsymia . She stopped Phentermine due to side effects of feeling jittery.    Bariatric surgery:  She has not had bariatric surgery      PHYSICAL EXAM:  There were no vitals taken for this visit. There is no height or weight on file to calculate BMI.  General: She is overweight, cooperative, alert, well developed, and in no acute distress. PSYCH: Has normal mood, affect and thought process.   Extremities: No edema.  Neurologic: No gross sensory or motor deficits. No tremors or fasciculations noted.    DIAGNOSTIC DATA REVIEWED:  BMET    Component Value Date/Time   NA 137 12/21/2023 0748   K 4.5 12/21/2023 0748   CL 104 12/21/2023 0748   CO2 20  12/21/2023 0748   GLUCOSE 85 12/21/2023 0748   GLUCOSE 107 (H) 07/17/2021 0943   BUN 20 12/21/2023 0748   CREATININE 0.71 12/21/2023 0748   CREATININE 0.52 11/11/2019 1619   CALCIUM 9.1 12/21/2023 0748   GFRNONAA >60 03/22/2019 2328   GFRAA >60 03/22/2019 2328   Lab Results  Component Value Date   HGBA1C 5.3 05/30/2022   HGBA1C 5.2 11/25/2016   Lab Results  Component Value Date   INSULIN  9.2 11/27/2021   Lab Results  Component Value Date   TSH 1.070 12/21/2023   CBC    Component Value Date/Time   WBC 7.2 12/15/2023 1559   RBC 4.29 12/15/2023 1559   HGB 13.3 12/15/2023 1559   HGB 13.6 06/25/2023 1127   HCT 39.4 12/15/2023 1559   HCT 42.2 06/25/2023 1127   PLT 288.0 12/15/2023 1559   PLT 261 06/25/2023 1127   MCV 91.9 12/15/2023 1559   MCV 95 06/25/2023 1127   MCH 30.5 06/25/2023 1127   MCH 35.0 (H) 11/11/2019 1619   MCHC 33.8 12/15/2023 1559   RDW 13.0 12/15/2023 1559   RDW 11.4 (L) 06/25/2023 1127   Iron Studies    Component Value Date/Time   IRON 117 06/25/2023 1127   TIBC 251 05/30/2022 0953   FERRITIN 329 (H) 12/21/2023 0748   IRONPCTSAT 34 05/30/2022 0953   IRONPCTSAT 23 07/17/2021 0943   Lipid Panel  Component Value Date/Time   CHOL 134 12/21/2023 0748   TRIG 49 12/21/2023 0748   HDL 42 12/21/2023 0748   CHOLHDL 4.7 (H) 05/30/2022 0953   CHOLHDL 5 05/02/2020 1022   VLDL 36.4 05/02/2020 1022   LDLCALC 81 12/21/2023 0748   Hepatic Function Panel     Component Value Date/Time   PROT 7.1 12/21/2023 0748   ALBUMIN 4.7 12/21/2023 0748   AST 16 12/21/2023 0748   ALT 25 12/21/2023 0748   ALT 361 11/23/2017 0000   ALKPHOS 61 12/21/2023 0748   BILITOT 0.8 12/21/2023 0748   BILIDIR 0.1 11/11/2021 0947      Component Value Date/Time   TSH 1.070 12/21/2023 0748   Nutritional Lab Results  Component Value Date   VD25OH 32.1 12/21/2023   VD25OH 38.3 06/25/2023   VD25OH 30.6 09/15/2022     ASSESSMENT AND PLAN  TREATMENT PLAN FOR  OBESITY:  Recommended Dietary Goals  Khrystyne is currently in the action stage of change. As such, her goal is to continue weight management plan. She has agreed to practicing portion control and making smarter food choices, such as increasing vegetables and decreasing simple carbohydrates.  Behavioral Intervention  We discussed the following Behavioral Modification Strategies today: increasing lean protein intake to established goals, decreasing simple carbohydrates , increasing vegetables, increasing fiber rich foods, increasing water intake , work on meal planning and preparation, reading food labels , keeping healthy foods at home, continue to work on maintaining a reduced calorie state, getting the recommended amount of protein, incorporating whole foods, making healthy choices, staying well hydrated and practicing mindfulness when eating., and increase protein intake, fibrous foods (25 grams per day for women, 30 grams for men) and water to improve satiety and decrease hunger signals. .  Additional resources provided today: NA  Recommended Physical Activity Goals  Emmelia has been advised to work up to 150 minutes of moderate intensity aerobic activity a week and strengthening exercises 2-3 times per week for cardiovascular health, weight loss maintenance and preservation of muscle mass.   She has agreed to Think about enjoyable ways to increase daily physical activity and overcoming barriers to exercise, Increase physical activity in their day and reduce sedentary time (increase NEAT)., Work on scheduling and tracking physical activity. , and Combine aerobic and strengthening exercises for efficiency and improved cardiometabolic health.   Pharmacotherapy We discussed various medication options to help Jaryn with her weight loss efforts and we both agreed to increase Zepbound  12.5mg . Side effects discussed.   Avoid Qsymia  and Topamax  due to taking Lamictal  ASSOCIATED CONDITIONS  ADDRESSED TODAY  Action/Plan  Polyphagia -     Zepbound ; Inject 12.5 mg into the skin once a week.  Dispense: 2 mL; Refill: 0 -     buPROPion  HCl ER (SR); Take 1 tablet (150 mg total) by mouth daily.  Dispense: 30 tablet; Refill: 0  Abnormal craving -     buPROPion  HCl ER (SR); Take 1 tablet (150 mg total) by mouth daily.  Dispense: 30 tablet; Refill: 0  Generalized obesity -     Zepbound ; Inject 12.5 mg into the skin once a week.  Dispense: 2 mL; Refill: 0  BMI 29.0-29.9,adult      Will obtain labs at next visit   Return in about 4 weeks (around 07/06/2024).SABRA She was informed of the importance of frequent follow up visits to maximize her success with intensive lifestyle modifications for her multiple health conditions.   ATTESTASTION STATEMENTS:  Reviewed by  clinician on day of visit: allergies, medications, problem list, medical history, surgical history, family history, social history, and previous encounter notes.     Corean SAUNDERS. Lysha Schrade FNP-C

## 2024-06-08 NOTE — Progress Notes (Deleted)
 Office: [phone]  /  Fax: [phone]  WEIGHT SUMMARY AND BIOMETRICS  No data recorded No data recorded  No data recorded Anthropometric Measurements Height: 5' 3 (1.6 m) Weight at Last Visit: 159lb Starting Weight: 262lb   No data recorded Other Clinical Data Fasting: yes Labs: no Today's Visit #: 30 Starting Date: 11/27/21     HPI  Chief Complaint: OBESITY  Brandi Kirk is here to discuss her progress with her obesity treatment plan. She is on the {MWMwtlossportion/plan2:23431} and states she is following her eating plan approximately *** % of the time. She states she is exercising *** minutes *** days per week.   Interval History:  Since last office visit she ***   Pharmacotherapy for weight loss: She {srtis (Optional):29129} currently taking {srtpreviousweightlossmeds (Optional):29124} for medical weight loss.  Denies side effects.    Previous pharmacotherapy for medical weight loss:  {srtpreviousweightlossmeds (Optional):29124}  She stopped *** due to side effects of ***.  Bariatric surgery:  Patient is status post {srtweightlosssurgery:29125} by Dr.  PIERRETTE in ***.  Her highest weight prior to surgery was *** and her nadir weight after surgery was ***.  She is taking {srtvitamins (Optional):29126}.  She reports {srtrestriction (Optional):29127} restriction.     PHYSICAL EXAM:  Height 5' 3 (1.6 m). Body mass index is 28.17 kg/m.  General: She is overweight, cooperative, alert, well developed, and in no acute distress. PSYCH: Has normal mood, affect and thought process.   Extremities: No edema.  Neurologic: No gross sensory or motor deficits. No tremors or fasciculations noted.    DIAGNOSTIC DATA REVIEWED:  BMET    Component Value Date/Time   NA 137 12/21/2023 0748   K 4.5 12/21/2023 0748   CL 104 12/21/2023 0748   CO2 20 12/21/2023 0748   GLUCOSE 85 12/21/2023 0748   GLUCOSE 107 (H) 07/17/2021 0943   BUN 20 12/21/2023 0748   CREATININE 0.71  12/21/2023 0748   CREATININE 0.52 11/11/2019 1619   CALCIUM 9.1 12/21/2023 0748   GFRNONAA >60 03/22/2019 2328   GFRAA >60 03/22/2019 2328   Lab Results  Component Value Date   HGBA1C 5.3 05/30/2022   HGBA1C 5.2 11/25/2016   Lab Results  Component Value Date   INSULIN  9.2 11/27/2021   Lab Results  Component Value Date   TSH 1.070 12/21/2023   CBC    Component Value Date/Time   WBC 7.2 12/15/2023 1559   RBC 4.29 12/15/2023 1559   HGB 13.3 12/15/2023 1559   HGB 13.6 06/25/2023 1127   HCT 39.4 12/15/2023 1559   HCT 42.2 06/25/2023 1127   PLT 288.0 12/15/2023 1559   PLT 261 06/25/2023 1127   MCV 91.9 12/15/2023 1559   MCV 95 06/25/2023 1127   MCH 30.5 06/25/2023 1127   MCH 35.0 (H) 11/11/2019 1619   MCHC 33.8 12/15/2023 1559   RDW 13.0 12/15/2023 1559   RDW 11.4 (L) 06/25/2023 1127   Iron Studies    Component Value Date/Time   IRON 117 06/25/2023 1127   TIBC 251 05/30/2022 0953   FERRITIN 329 (H) 12/21/2023 0748   IRONPCTSAT 34 05/30/2022 0953   IRONPCTSAT 23 07/17/2021 0943   Lipid Panel     Component Value Date/Time   CHOL 134 12/21/2023 0748   TRIG 49 12/21/2023 0748   HDL 42 12/21/2023 0748   CHOLHDL 4.7 (H) 05/30/2022 0953   CHOLHDL 5 05/02/2020 1022   VLDL 36.4 05/02/2020 1022   LDLCALC 81 12/21/2023 0748   Hepatic Function Panel  Component Value Date/Time   PROT 7.1 12/21/2023 0748   ALBUMIN 4.7 12/21/2023 0748   AST 16 12/21/2023 0748   ALT 25 12/21/2023 0748   ALT 361 11/23/2017 0000   ALKPHOS 61 12/21/2023 0748   BILITOT 0.8 12/21/2023 0748   BILIDIR 0.1 11/11/2021 0947      Component Value Date/Time   TSH 1.070 12/21/2023 0748   Nutritional Lab Results  Component Value Date   VD25OH 32.1 12/21/2023   VD25OH 38.3 06/25/2023   VD25OH 30.6 09/15/2022     ASSESSMENT AND PLAN  TREATMENT PLAN FOR OBESITY:  Recommended Dietary Goals  Brandi Kirk is currently in the action stage of change. As such, her goal is to continue weight  management plan. She has agreed to {MWMwtlossportion/plan2:23431}.  Behavioral Intervention  We discussed the following Behavioral Modification Strategies today: {EMWMwtlossstrategies:28914::continue to work on maintaining a reduced calorie state, getting the recommended amount of protein, incorporating whole foods, making healthy choices, staying well hydrated and practicing mindfulness when eating.,increase protein intake, fibrous foods (25 grams per day for women, 30 grams for men) and water to improve satiety and decrease hunger signals. }.  Additional resources provided today: NA  Recommended Physical Activity Goals  Brandi Kirk has been advised to work up to 150 minutes of moderate intensity aerobic activity a week and strengthening exercises 2-3 times per week for cardiovascular health, weight loss maintenance and preservation of muscle mass.   She has agreed to {EMEXERCISE:28847::Think about enjoyable ways to increase daily physical activity and overcoming barriers to exercise,Increase physical activity in their day and reduce sedentary time (increase NEAT).,Increase volume of physical activity to a goal of 240 minutes a week,Combine aerobic and strengthening exercises for efficiency and improved cardiometabolic health.}   Pharmacotherapy We discussed various medication options to help Brandi Kirk with her weight loss efforts and we both agreed to ***.  ASSOCIATED CONDITIONS ADDRESSED TODAY  Action/Plan  There are no diagnoses linked to this encounter.       No follow-ups on file.Brandi Kirk She was informed of the importance of frequent follow up visits to maximize her success with intensive lifestyle modifications for her multiple health conditions.   ATTESTASTION STATEMENTS:  Reviewed by clinician on day of visit: allergies, medications, problem list, medical history, surgical history, family history, social history, and previous encounter notes.   Time spent on visit including  pre-visit chart review and post-visit care and charting was *** minutes.    Brandi Kirk. Tickerhoff FNP-C

## 2024-06-28 ENCOUNTER — Other Ambulatory Visit: Payer: Self-pay | Admitting: Nurse Practitioner

## 2024-06-28 DIAGNOSIS — R632 Polyphagia: Secondary | ICD-10-CM

## 2024-06-28 DIAGNOSIS — E669 Obesity, unspecified: Secondary | ICD-10-CM

## 2024-07-03 ENCOUNTER — Other Ambulatory Visit: Payer: Self-pay | Admitting: Nurse Practitioner

## 2024-07-03 DIAGNOSIS — R632 Polyphagia: Secondary | ICD-10-CM

## 2024-07-03 DIAGNOSIS — R638 Other symptoms and signs concerning food and fluid intake: Secondary | ICD-10-CM

## 2024-07-04 ENCOUNTER — Ambulatory Visit: Admitting: Nurse Practitioner

## 2024-07-04 ENCOUNTER — Encounter: Payer: Self-pay | Admitting: Nurse Practitioner

## 2024-07-04 VITALS — BP 101/68 | HR 85 | Temp 98.4°F | Ht 63.0 in | Wt 155.0 lb

## 2024-07-04 DIAGNOSIS — E559 Vitamin D deficiency, unspecified: Secondary | ICD-10-CM | POA: Diagnosis not present

## 2024-07-04 DIAGNOSIS — E538 Deficiency of other specified B group vitamins: Secondary | ICD-10-CM | POA: Diagnosis not present

## 2024-07-04 DIAGNOSIS — R7989 Other specified abnormal findings of blood chemistry: Secondary | ICD-10-CM

## 2024-07-04 DIAGNOSIS — E519 Thiamine deficiency, unspecified: Secondary | ICD-10-CM

## 2024-07-04 DIAGNOSIS — E669 Obesity, unspecified: Secondary | ICD-10-CM | POA: Diagnosis not present

## 2024-07-04 DIAGNOSIS — Z79899 Other long term (current) drug therapy: Secondary | ICD-10-CM

## 2024-07-04 DIAGNOSIS — Z6827 Body mass index (BMI) 27.0-27.9, adult: Secondary | ICD-10-CM

## 2024-07-04 MED ORDER — ZEPBOUND 10 MG/0.5ML ~~LOC~~ SOAJ: 10.0000 mg | SUBCUTANEOUS | 0 refills | Status: DC

## 2024-07-04 NOTE — Progress Notes (Signed)
 Office: [phone]  /  Fax: [phone]  WEIGHT SUMMARY AND BIOMETRICS  Weight Lost Since Last Visit: 4lb  Weight Gained Since Last Visit: 0lb   Vitals Temp: 98.4 F (36.9 C) BP: 101/68 Pulse Rate: 85 SpO2: 97 %   Anthropometric Measurements Height: 5' 3 (1.6 m) Weight: 155 lb (70.3 kg) BMI (Calculated): 27.46 Weight at Last Visit: 1659lb Weight Lost Since Last Visit: 4lb Weight Gained Since Last Visit: 0lb Starting Weight: 262lb Total Weight Loss (lbs): 107 lb (48.5 kg)   Body Composition  Body Fat %: 36.4 % Fat Mass (lbs): 56.4 lbs Muscle Mass (lbs): 93.8 lbs Total Body Water (lbs): 71.4 lbs Visceral Fat Rating : 6   Other Clinical Data Fasting: Yes Labs: No Today's Visit #: 31 Starting Date: 11/27/21     HPI  Chief Complaint: OBESITY  Jajaira is here to discuss her progress with her obesity treatment plan. She is on the the Category 1 Plan and states she is following her eating plan approximately 80 % of the time. She states she is exercising 0 minutes 0 days per week.   Interval History:  Since last office visit she has lost 4 pounds.  She has been struggling with some stress at home and hasn't been eating as she should or following the meal plan.  She notes things are getting better and she is back to eating regular, not skipping meals and adding a protein in each meal.  She is drinking water daily.  She is not currently exercising.  Her highest weight was 296 lbs.    Pharmacotherapy for weight loss: She is currently taking Zepbound  12.5 mg for medical weight loss.  Denies side effects.    She is also taking Wellbutrin  SR 150mg  off label for cravings.  Denies side effects     Previous pharmacotherapy for medical weight loss:    -She has tried Phentermine, Zepbound  and Qsymia . She stopped Phentermine due to side effects of feeling jittery.    Bariatric surgery:  She has not had bariatric surgery  Vit D deficiency  She is not currently  taking Vit D.    Lab Results  Component Value Date   VD25OH 32.1 12/21/2023   VD25OH 38.3 06/25/2023   VD25OH 30.6 09/15/2022    Vit B12 def Not currently taking Vit B12  Elevated ferritin Not currently taking iron or a MVI.  I had referred her to hematology back on 03/09/24.     PHYSICAL EXAM:  Blood pressure 101/68, pulse 85, temperature 98.4 F (36.9 C), height 5' 3 (1.6 m), weight 155 lb (70.3 kg), SpO2 97%. Body mass index is 27.46 kg/m.  General: She is overweight, cooperative, alert, well developed, and in no acute distress. PSYCH: Has normal mood, affect and thought process.   Extremities: No edema.  Neurologic: No gross sensory or motor deficits. No tremors or fasciculations noted.    DIAGNOSTIC DATA REVIEWED:  BMET    Component Value Date/Time   NA 137 12/21/2023 0748   K 4.5 12/21/2023 0748   CL 104 12/21/2023 0748   CO2 20 12/21/2023 0748   GLUCOSE 85 12/21/2023 0748   GLUCOSE 107 (H) 07/17/2021 0943   BUN 20 12/21/2023 0748   CREATININE 0.71 12/21/2023 0748   CREATININE 0.52 11/11/2019 1619   CALCIUM 9.1 12/21/2023 0748   GFRNONAA >60 03/22/2019 2328   GFRAA >60 03/22/2019 2328   Lab Results  Component Value Date   HGBA1C 5.3 05/30/2022   HGBA1C 5.2 11/25/2016  Lab Results  Component Value Date   INSULIN  9.2 11/27/2021   Lab Results  Component Value Date   TSH 1.070 12/21/2023   CBC    Component Value Date/Time   WBC 7.2 12/15/2023 1559   RBC 4.29 12/15/2023 1559   HGB 13.3 12/15/2023 1559   HGB 13.6 06/25/2023 1127   HCT 39.4 12/15/2023 1559   HCT 42.2 06/25/2023 1127   PLT 288.0 12/15/2023 1559   PLT 261 06/25/2023 1127   MCV 91.9 12/15/2023 1559   MCV 95 06/25/2023 1127   MCH 30.5 06/25/2023 1127   MCH 35.0 (H) 11/11/2019 1619   MCHC 33.8 12/15/2023 1559   RDW 13.0 12/15/2023 1559   RDW 11.4 (L) 06/25/2023 1127   Iron Studies    Component Value Date/Time   IRON 117 06/25/2023 1127   TIBC 251 05/30/2022 0953   FERRITIN  329 (H) 12/21/2023 0748   IRONPCTSAT 34 05/30/2022 0953   IRONPCTSAT 23 07/17/2021 0943   Lipid Panel     Component Value Date/Time   CHOL 134 12/21/2023 0748   TRIG 49 12/21/2023 0748   HDL 42 12/21/2023 0748   CHOLHDL 4.7 (H) 05/30/2022 0953   CHOLHDL 5 05/02/2020 1022   VLDL 36.4 05/02/2020 1022   LDLCALC 81 12/21/2023 0748   Hepatic Function Panel     Component Value Date/Time   PROT 7.1 12/21/2023 0748   ALBUMIN 4.7 12/21/2023 0748   AST 16 12/21/2023 0748   ALT 25 12/21/2023 0748   ALT 361 11/23/2017 0000   ALKPHOS 61 12/21/2023 0748   BILITOT 0.8 12/21/2023 0748   BILIDIR 0.1 11/11/2021 0947      Component Value Date/Time   TSH 1.070 12/21/2023 0748   Nutritional Lab Results  Component Value Date   VD25OH 32.1 12/21/2023   VD25OH 38.3 06/25/2023   VD25OH 30.6 09/15/2022     ASSESSMENT AND PLAN  TREATMENT PLAN FOR OBESITY:  Recommended Dietary Goals  Audreyanna is currently in the action stage of change. As such, her goal is to continue weight management plan. She has agreed to the Category 2 Plan.  Behavioral Intervention  We discussed the following Behavioral Modification Strategies today: increasing lean protein intake to established goals, decreasing simple carbohydrates , increasing vegetables, increasing fiber rich foods, increasing water intake , work on meal planning and preparation, reading food labels , keeping healthy foods at home, continue to work on maintaining a reduced calorie state, getting the recommended amount of protein, incorporating whole foods, making healthy choices, staying well hydrated and practicing mindfulness when eating., and increase protein intake, fibrous foods (25 grams per day for women, 30 grams for men) and water to improve satiety and decrease hunger signals. .  Additional resources provided today: NA  Recommended Physical Activity Goals  Domonic has been advised to work up to 150 minutes of moderate intensity aerobic  activity a week and strengthening exercises 2-3 times per week for cardiovascular health, weight loss maintenance and preservation of muscle mass.   She has agreed to Think about enjoyable ways to increase daily physical activity and overcoming barriers to exercise, Increase physical activity in their day and reduce sedentary time (increase NEAT)., Work on scheduling and tracking physical activity. , and Combine aerobic and strengthening exercises for efficiency and improved cardiometabolic health.   Pharmacotherapy We discussed various medication options to help Maleya with her weight loss efforts and we both agreed to decrease Zepbound  to 10 mg.  I am concerned about loss and muscle  mass and increase in body fat percentage.  I will see how she does on 10 mg and then we will base her next best plan of care.  Side effects discussed.  ASSOCIATED CONDITIONS ADDRESSED TODAY  Action/Plan  Vitamin D  deficiency -     VITAMIN D  25 Hydroxy (Vit-D Deficiency, Fractures)  Vitamin B1 deficiency -     Vitamin B12  Elevated ferritin -     CBC with Differential/Platelet -     Ferritin -     Iron  Medication management -     Comprehensive metabolic panel with GFR  Generalized obesity -     Zepbound ; Inject 10 mg into the skin once a week.  Dispense: 2 mL; Refill: 0  BMI 27.0-27.9,adult  She has overall done well with weight loss.  I am concerned about her recent muscle mass loss.  I have encouraged her to increase her protein intake, not to skip meals and to start exercising.  Will reevaluate her bioimpedance at her next visit and adjust medications as needed.     Return in about 4 weeks (around 08/01/2024).SABRA She was informed of the importance of frequent follow up visits to maximize her success with intensive lifestyle modifications for her multiple health conditions.   ATTESTASTION STATEMENTS:  Reviewed by clinician on day of visit: allergies, medications, problem list, medical history,  surgical history, family history, social history, and previous encounter notes.     Corean SAUNDERS. Dimetrius Montfort FNP-C

## 2024-07-05 LAB — CBC WITH DIFFERENTIAL/PLATELET
Basophils Absolute: 0 x10E3/uL (ref 0.0–0.2)
Basos: 1 %
EOS (ABSOLUTE): 0.2 x10E3/uL (ref 0.0–0.4)
Eos: 3 %
Hematocrit: 39.6 % (ref 34.0–46.6)
Hemoglobin: 12.6 g/dL (ref 11.1–15.9)
Immature Grans (Abs): 0 x10E3/uL (ref 0.0–0.1)
Immature Granulocytes: 0 %
Lymphocytes Absolute: 1.9 x10E3/uL (ref 0.7–3.1)
Lymphs: 37 %
MCH: 30.7 pg (ref 26.6–33.0)
MCHC: 31.8 g/dL (ref 31.5–35.7)
MCV: 96 fL (ref 79–97)
Monocytes Absolute: 0.4 x10E3/uL (ref 0.1–0.9)
Monocytes: 8 %
Neutrophils Absolute: 2.8 x10E3/uL (ref 1.4–7.0)
Neutrophils: 51 %
Platelets: 240 x10E3/uL (ref 150–450)
RBC: 4.11 x10E6/uL (ref 3.77–5.28)
RDW: 11.9 % (ref 11.7–15.4)
WBC: 5.3 x10E3/uL (ref 3.4–10.8)

## 2024-07-05 LAB — VITAMIN D 25 HYDROXY (VIT D DEFICIENCY, FRACTURES): Vit D, 25-Hydroxy: 27.4 ng/mL — ABNORMAL LOW (ref 30.0–100.0)

## 2024-07-05 LAB — COMPREHENSIVE METABOLIC PANEL WITH GFR
ALT: 16 IU/L (ref 0–32)
AST: 14 IU/L (ref 0–40)
Albumin: 4.6 g/dL (ref 3.9–4.9)
Alkaline Phosphatase: 51 IU/L (ref 41–116)
BUN/Creatinine Ratio: 13 (ref 9–23)
BUN: 9 mg/dL (ref 6–20)
Bilirubin Total: 1.2 mg/dL (ref 0.0–1.2)
CO2: 23 mmol/L (ref 20–29)
Calcium: 9.1 mg/dL (ref 8.7–10.2)
Chloride: 102 mmol/L (ref 96–106)
Creatinine, Ser: 0.67 mg/dL (ref 0.57–1.00)
Globulin, Total: 2.1 g/dL (ref 1.5–4.5)
Glucose: 76 mg/dL (ref 70–99)
Potassium: 4.2 mmol/L (ref 3.5–5.2)
Sodium: 137 mmol/L (ref 134–144)
Total Protein: 6.7 g/dL (ref 6.0–8.5)
eGFR: 116 mL/min/1.73 (ref >59)

## 2024-07-05 LAB — VITAMIN B12: Vitamin B-12: 575 pg/mL (ref 232–1245)

## 2024-07-05 LAB — FERRITIN: Ferritin: 435 ng/mL — ABNORMAL HIGH (ref 15–150)

## 2024-07-05 LAB — IRON: Iron: 99 ug/dL (ref 27–159)

## 2024-07-06 ENCOUNTER — Other Ambulatory Visit: Payer: Self-pay | Admitting: Nurse Practitioner

## 2024-07-06 ENCOUNTER — Ambulatory Visit: Payer: Self-pay | Admitting: Nurse Practitioner

## 2024-07-06 DIAGNOSIS — R7989 Other specified abnormal findings of blood chemistry: Secondary | ICD-10-CM

## 2024-07-12 ENCOUNTER — Encounter: Payer: Self-pay | Admitting: Nurse Practitioner

## 2024-07-12 ENCOUNTER — Other Ambulatory Visit: Payer: Self-pay | Admitting: Bariatrics

## 2024-07-12 MED ORDER — ZEPBOUND 10 MG/0.5ML ~~LOC~~ SOLN: 10.0000 mg | SUBCUTANEOUS | 0 refills | Status: DC

## 2024-08-02 ENCOUNTER — Other Ambulatory Visit: Payer: Self-pay | Admitting: Bariatrics

## 2024-08-08 ENCOUNTER — Encounter: Payer: Self-pay | Admitting: Nurse Practitioner

## 2024-08-08 ENCOUNTER — Ambulatory Visit: Admitting: Nurse Practitioner

## 2024-08-08 VITALS — BP 96/65 | HR 75 | Temp 98.3°F | Ht 63.0 in | Wt 158.0 lb

## 2024-08-08 DIAGNOSIS — E559 Vitamin D deficiency, unspecified: Secondary | ICD-10-CM

## 2024-08-08 DIAGNOSIS — E669 Obesity, unspecified: Secondary | ICD-10-CM

## 2024-08-08 DIAGNOSIS — R632 Polyphagia: Secondary | ICD-10-CM | POA: Diagnosis not present

## 2024-08-08 DIAGNOSIS — R638 Other symptoms and signs concerning food and fluid intake: Secondary | ICD-10-CM

## 2024-08-08 DIAGNOSIS — Z6827 Body mass index (BMI) 27.0-27.9, adult: Secondary | ICD-10-CM

## 2024-08-08 MED ORDER — BUPROPION HCL ER (SR) 150 MG PO TB12: 150.0000 mg | ORAL_TABLET | Freq: Every day | ORAL | 0 refills | Status: DC

## 2024-08-08 MED ORDER — ZEPBOUND 12.5 MG/0.5ML ~~LOC~~ SOLN: 12.5000 mg | SUBCUTANEOUS | 0 refills | Status: DC

## 2024-08-08 MED ORDER — BUPROPION HCL ER (SR) 150 MG PO TB12: 150.0000 mg | ORAL_TABLET | Freq: Two times a day (BID) | ORAL | 0 refills | Status: DC

## 2024-08-08 MED ORDER — VITAMIN D (ERGOCALCIFEROL) 1.25 MG (50000 UNIT) PO CAPS: 50000.0000 [IU] | ORAL_CAPSULE | ORAL | 0 refills | Status: DC

## 2024-08-08 NOTE — Progress Notes (Signed)
 Office: [phone]  /  Fax: [phone]  WEIGHT SUMMARY AND BIOMETRICS  Weight Lost Since Last Visit: 0lb  Weight Gained Since Last Visit: 3lb   Vitals Temp: 98.3 F (36.8 C) BP: 96/65 Pulse Rate: 75 SpO2: 99 %   Anthropometric Measurements Height: 5' 3 (1.6 m) Weight: 158 lb (71.7 kg) BMI (Calculated): 28 Weight at Last Visit: 155lb Weight Lost Since Last Visit: 0lb Weight Gained Since Last Visit: 3lb Starting Weight: 262lb Total Weight Loss (lbs): 104 lb (47.2 kg)   Body Composition  Body Fat %: 38.2 % Fat Mass (lbs): 60.6 lbs Muscle Mass (lbs): 93.2 lbs Total Body Water (lbs): 73.2 lbs Visceral Fat Rating : 6   Other Clinical Data Fasting: Yes Labs: No Today's Visit #: 32 Starting Date: 11/27/21     HPI  Chief Complaint: OBESITY  Brandi Kirk is here to discuss her progress with her obesity treatment plan. She is on the the Category 1 Plan and states she is following her eating plan approximately 75 % of the time. She states she is exercising 0 minutes 0 days per week.   Interval History:  Since last office visit she has gained 3 pounds.  She notes that she was sick for a week and hasn't been following a meal plan.  She is watching her portion sizes and is trying to make healthier choices.   She notes she hates exercising and hasn't been able to exercise at work as she has in the past. She is struggling with polyphagia and cravings.  She had episode last month for 10 days where she was struggling with eating 3 meals per day.  She is overall doing better this month and is not skipping meals.   She stopped smoking and vaping 2 months ago.    Her highest weight was 296 lbs.    Pharmacotherapy for weight loss: She is currently taking Zepbound  12.5 mg for medical weight loss-7-10 days (takes every 10 days only when sick).  Denies side effects.     She is also taking Wellbutrin  SR 150mg  off label for cravings.  Denies side effects     Previous  pharmacotherapy for medical weight loss:    -She has tried Phentermine, Zepbound  and Qsymia . She stopped Phentermine due to side effects of feeling jittery.    Bariatric surgery:  She has not had bariatric surgery  Vit D deficiency  She is not taking Vit D.    Lab Results  Component Value Date   VD25OH 27.4 (L) 07/04/2024   VD25OH 32.1 12/21/2023   VD25OH 38.3 06/25/2023    Elevated ferritin She is scheduled to see hematology on 08/22/24.  She is not taking a MVI or iron.    PHYSICAL EXAM:  Blood pressure 96/65, pulse 75, temperature 98.3 F (36.8 C), height 5' 3 (1.6 m), weight 158 lb (71.7 kg), SpO2 99%. Body mass index is 27.99 kg/m.  General: She is overweight, cooperative, alert, well developed, and in no acute distress. PSYCH: Has normal mood, affect and thought process.   Extremities: No edema.  Neurologic: No gross sensory or motor deficits. No tremors or fasciculations noted.    DIAGNOSTIC DATA REVIEWED:  BMET    Component Value Date/Time   NA 137 07/04/2024 0753   K 4.2 07/04/2024 0753   CL 102 07/04/2024 0753   CO2 23 07/04/2024 0753   GLUCOSE 76 07/04/2024 0753   GLUCOSE 107 (H) 07/17/2021 0943   BUN 9 07/04/2024 0753   CREATININE 0.67 07/04/2024  0753   CREATININE 0.52 11/11/2019 1619   CALCIUM 9.1 07/04/2024 0753   GFRNONAA >60 03/22/2019 2328   GFRAA >60 03/22/2019 2328   Lab Results  Component Value Date   HGBA1C 5.3 05/30/2022   HGBA1C 5.2 11/25/2016   Lab Results  Component Value Date   INSULIN  9.2 11/27/2021   Lab Results  Component Value Date   TSH 1.070 12/21/2023   CBC    Component Value Date/Time   WBC 5.3 07/04/2024 0753   WBC 7.2 12/15/2023 1559   RBC 4.11 07/04/2024 0753   RBC 4.29 12/15/2023 1559   HGB 12.6 07/04/2024 0753   HCT 39.6 07/04/2024 0753   PLT 240 07/04/2024 0753   MCV 96 07/04/2024 0753   MCH 30.7 07/04/2024 0753   MCH 35.0 (H) 11/11/2019 1619   MCHC 31.8 07/04/2024 0753   MCHC 33.8 12/15/2023 1559    RDW 11.9 07/04/2024 0753   Iron Studies    Component Value Date/Time   IRON 99 07/04/2024 0753   TIBC 251 05/30/2022 0953   FERRITIN 435 (H) 07/04/2024 0753   IRONPCTSAT 34 05/30/2022 0953   IRONPCTSAT 23 07/17/2021 0943   Lipid Panel     Component Value Date/Time   CHOL 134 12/21/2023 0748   TRIG 49 12/21/2023 0748   HDL 42 12/21/2023 0748   CHOLHDL 4.7 (H) 05/30/2022 0953   CHOLHDL 5 05/02/2020 1022   VLDL 36.4 05/02/2020 1022   LDLCALC 81 12/21/2023 0748   Hepatic Function Panel     Component Value Date/Time   PROT 6.7 07/04/2024 0753   ALBUMIN 4.6 07/04/2024 0753   AST 14 07/04/2024 0753   ALT 16 07/04/2024 0753   ALT 361 11/23/2017 0000   ALKPHOS 51 07/04/2024 0753   BILITOT 1.2 07/04/2024 0753   BILIDIR 0.1 11/11/2021 0947      Component Value Date/Time   TSH 1.070 12/21/2023 0748   Nutritional Lab Results  Component Value Date   VD25OH 27.4 (L) 07/04/2024   VD25OH 32.1 12/21/2023   VD25OH 38.3 06/25/2023     ASSESSMENT AND PLAN  TREATMENT PLAN FOR OBESITY:  Recommended Dietary Goals  Mirna is currently in the action stage of change. As such, her goal is to continue weight management plan. She has agreed to practicing portion control and making smarter food choices, such as increasing vegetables and decreasing simple carbohydrates.  Behavioral Intervention  We discussed the following Behavioral Modification Strategies today: increasing lean protein intake to established goals, decreasing simple carbohydrates , increasing vegetables, increasing fiber rich foods, increasing water intake , work on meal planning and preparation, reading food labels , keeping healthy foods at home, continue to work on maintaining a reduced calorie state, getting the recommended amount of protein, incorporating whole foods, making healthy choices, staying well hydrated and practicing mindfulness when eating., and increase protein intake, fibrous foods (25 grams per day for  women, 30 grams for men) and water to improve satiety and decrease hunger signals. .  Additional resources provided today: NA  Recommended Physical Activity Goals  Lavelle has been advised to work up to 150 minutes of moderate intensity aerobic activity a week and strengthening exercises 2-3 times per week for cardiovascular health, weight loss maintenance and preservation of muscle mass.   She has agreed to Think about enjoyable ways to increase daily physical activity and overcoming barriers to exercise, Increase physical activity in their day and reduce sedentary time (increase NEAT)., Work on scheduling and tracking physical activity. , and  Combine aerobic and strengthening exercises for efficiency and improved cardiometabolic health.   Pharmacotherapy We discussed various medication options to help Evangaline with her weight loss efforts and we both agreed to increase Zepbound  12.5mg .  Side effects discussed.  Will continue to monitor bio impedance.    ASSOCIATED CONDITIONS ADDRESSED TODAY  Action/Plan  Vitamin D  deficiency -     Start Vitamin D  (Ergocalciferol ); Take 1 capsule (50,000 Units total) by mouth every 7 (seven) days.  Dispense: 5 capsule; Refill: 0  Low Vitamin D  level contributes to fatigue and are associated with obesity, breast, and colon cancer. She agrees to continue to take prescription Vitamin D  @50 ,000 IU every week and will follow-up for routine testing of Vitamin D , at least 2-3 times per year to avoid over-replacement.   Polyphagia -     Increase buPROPion  HCl ER (SR); Take 1 tablet (150 mg total) by mouth BID.  Dispense: 60 tablet; Refill: 0. Side effects discussed.  Will monitor for any mood changes.   -     Zepbound ; Inject 12.5 mg into the skin once a week.  Dispense: 2 mL; Refill: 0. Side effects discussed.  Will continue to monitor bio impedance.    Generalized obesity -     Zepbound ; Inject 12.5 mg into the skin once a week.  Dispense: 2 mL; Refill: 0  BMI  27.0-27.9,adult  Abnormal craving -     buPROPion  HCl ER (SR); Take 1 tablet (150 mg total) by mouth BID.  Dispense: 60 tablet; Refill: 0     Labs reviewed with patient on 07/04/24    Return in about 4 weeks (around 09/05/2024).SABRA She was informed of the importance of frequent follow up visits to maximize her success with intensive lifestyle modifications for her multiple health conditions.   ATTESTASTION STATEMENTS:  Reviewed by clinician on day of visit: allergies, medications, problem list, medical history, surgical history, family history, social history, and previous encounter notes.     Corean SAUNDERS. Gwendalyn Mcgonagle FNP-C

## 2024-08-10 ENCOUNTER — Other Ambulatory Visit

## 2024-08-10 ENCOUNTER — Encounter: Admitting: Nurse Practitioner

## 2024-08-17 ENCOUNTER — Telehealth: Payer: Self-pay | Admitting: Nurse Practitioner

## 2024-08-17 NOTE — Telephone Encounter (Signed)
 Copied from CRM [id number]. Topic: Appointments - Transfer of Care >> Aug 17, 2024 11:06 AM Franky GRADE wrote: Pt is requesting to transfer FROM: [facility] Jayson Early) Pt is requesting to transfer TO: [facility] Reason for requested transfer: She is never able to get an appointment with Camie Doing and has had great experience with Charlies Bellini  It is the responsibility of the team the patient would like to transfer to (Dr. Bellini) to reach out to the patient if for any reason this transfer is not acceptable.  Patient wanted to advise that she has come along way from where she was before and has been in recovery and would like to know if Charlies Bellini would make an exception in seeing her again.

## 2024-08-22 ENCOUNTER — Inpatient Hospital Stay: Attending: Oncology | Admitting: Oncology

## 2024-08-22 ENCOUNTER — Encounter: Payer: Self-pay | Admitting: Oncology

## 2024-08-22 ENCOUNTER — Inpatient Hospital Stay

## 2024-08-22 VITALS — BP 118/81 | HR 93 | Temp 97.7°F | Resp 16 | Wt 155.5 lb

## 2024-08-22 DIAGNOSIS — R7989 Other specified abnormal findings of blood chemistry: Secondary | ICD-10-CM | POA: Insufficient documentation

## 2024-08-22 DIAGNOSIS — K7040 Alcoholic hepatic failure without coma: Secondary | ICD-10-CM | POA: Diagnosis not present

## 2024-08-22 DIAGNOSIS — I10 Essential (primary) hypertension: Secondary | ICD-10-CM | POA: Insufficient documentation

## 2024-08-22 DIAGNOSIS — Z87891 Personal history of nicotine dependence: Secondary | ICD-10-CM | POA: Insufficient documentation

## 2024-08-22 DIAGNOSIS — F1021 Alcohol dependence, in remission: Secondary | ICD-10-CM | POA: Insufficient documentation

## 2024-08-22 DIAGNOSIS — E282 Polycystic ovarian syndrome: Secondary | ICD-10-CM | POA: Insufficient documentation

## 2024-08-22 DIAGNOSIS — N6019 Diffuse cystic mastopathy of unspecified breast: Secondary | ICD-10-CM | POA: Insufficient documentation

## 2024-08-22 LAB — IRON AND TIBC
Iron: 62 ug/dL (ref 28–170)
Saturation Ratios: 24 % (ref 10.4–31.8)
TIBC: 260 ug/dL (ref 250–450)
UIBC: 198 ug/dL

## 2024-08-22 LAB — CBC WITH DIFFERENTIAL (CANCER CENTER ONLY)
Abs Immature Granulocytes: 0.01 K/uL (ref 0.00–0.07)
Basophils Absolute: 0 K/uL (ref 0.0–0.1)
Basophils Relative: 1 %
Eosinophils Absolute: 0.1 K/uL (ref 0.0–0.5)
Eosinophils Relative: 1 %
HCT: 34.9 % — ABNORMAL LOW (ref 36.0–46.0)
Hemoglobin: 12.1 g/dL (ref 12.0–15.0)
Immature Granulocytes: 0 %
Lymphocytes Relative: 37 %
Lymphs Abs: 2.4 K/uL (ref 0.7–4.0)
MCH: 30.6 pg (ref 26.0–34.0)
MCHC: 34.7 g/dL (ref 30.0–36.0)
MCV: 88.4 fL (ref 80.0–100.0)
Monocytes Absolute: 0.4 K/uL (ref 0.1–1.0)
Monocytes Relative: 7 %
Neutro Abs: 3.5 K/uL (ref 1.7–7.7)
Neutrophils Relative %: 54 %
Platelet Count: 273 K/uL (ref 150–400)
RBC: 3.95 MIL/uL (ref 3.87–5.11)
RDW: 12 % (ref 11.5–15.5)
WBC Count: 6.5 K/uL (ref 4.0–10.5)
nRBC: 0 % (ref 0.0–0.2)

## 2024-08-22 LAB — CMP (CANCER CENTER ONLY)
ALT: 17 U/L (ref 0–44)
AST: 14 U/L — ABNORMAL LOW (ref 15–41)
Albumin: 4.8 g/dL (ref 3.5–5.0)
Alkaline Phosphatase: 52 U/L (ref 38–126)
Anion gap: 12 (ref 5–15)
BUN: 9 mg/dL (ref 6–20)
CO2: 24 mmol/L (ref 22–32)
Calcium: 9.8 mg/dL (ref 8.9–10.3)
Chloride: 104 mmol/L (ref 98–111)
Creatinine: 0.69 mg/dL (ref 0.44–1.00)
GFR, Estimated: >60 mL/min (ref >60)
Glucose, Bld: 81 mg/dL (ref 70–99)
Potassium: 3.8 mmol/L (ref 3.5–5.1)
Sodium: 140 mmol/L (ref 135–145)
Total Bilirubin: 1.6 mg/dL — ABNORMAL HIGH (ref 0.0–1.2)
Total Protein: 7.5 g/dL (ref 6.5–8.1)

## 2024-08-22 LAB — LACTATE DEHYDROGENASE: LDH: 131 U/L (ref 105–235)

## 2024-08-22 LAB — FERRITIN: Ferritin: 507 ng/mL — ABNORMAL HIGH (ref 11–307)

## 2024-08-22 NOTE — Assessment & Plan Note (Addendum)
 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 Recent ferritin level was 435 on 07/04/2024.   No family history of hemochromatosis.   Differential diagnosis includes hemochromatosis, given the persistent elevation of ferritin.  It could also be an acute phase reactant.  No symptoms of iron overload such as nausea, vomiting, or gastrointestinal issues. Liver function tests are preserved.  Labs today showed unremarkable CBCD and CMP except for total bilirubin of 1.6.  Rest of the LFTs are normal.  LDH normal.  Ferritin remains elevated at 407.  Iron studies pending.  - Ordered blood tests for hemochromatosis DNA (C282Y, H63D, S65C mutations)  - Will arrange phone visit in two weeks to discuss results.  If confirmed to be high due to hemochromatosis, we will aim for ferritin below 50 with therapeutic phlebotomies as needed.  If complex heterozygosity is noted, then goal would be to maintain ferritin closer to 200, depending on the mutation analysis.  Additional testing including echocardiogram will be pursued as needed.  - Will schedule follow-up appointment in three months

## 2024-08-22 NOTE — Progress Notes (Signed)
 [facility]  HEMATOLOGY CLINIC CONSULTATION NOTE   PATIENT NAME: Brandi Kirk   MR#: [id number] DOB: 1985-10-17  DATE OF SERVICE: 08/22/2024   REFERRING PROVIDER  Corean Scala, FNP  Patient Care Team: Early, Camie BRAVO, NP as PCP - General (Nurse Practitioner) Sarrah Browning, MD as Consulting Physician (Obstetrics and Gynecology) Lindaann Dunnings, MD as Referring Physician (Internal Medicine) Scala Corean, FNP (Nurse Practitioner)   REASON FOR CONSULTATION/ CHIEF COMPLAINT:  Elevated ferritin level  ASSESSMENT & PLAN:  Brandi Kirk is a 38 y.o. lady with a past medical history of hypertension, dyslipidemia, PCOS, prediabetes, history of HPV, B12 deficiency, bipolar disorder, Hx of alcohol dependency, was referred to our service for evaluation of elevated ferritin.    Elevated ferritin Chronic elevation of ferritin levels since 2020, with a peak of 919 in August 2021 during hospitalization for severe alcoholism and liver failure. Recent ferritin level was 435 on 07/04/2024.   No family history of hemochromatosis.   Differential diagnosis includes hemochromatosis, given the persistent elevation of ferritin.  It could also be an acute phase reactant.  No symptoms of iron overload such as nausea, vomiting, or gastrointestinal issues. Liver function tests are preserved.  Labs today showed unremarkable CBCD and CMP except for total bilirubin of 1.6.  Rest of the LFTs are normal.  LDH normal.  Ferritin remains elevated at 407.  Iron studies pending.  - Ordered blood tests for hemochromatosis DNA (C282Y, H63D, S65C mutations)  - Will arrange phone visit in two weeks to discuss results.  If confirmed to be high due to hemochromatosis, we will aim for ferritin below 50 with therapeutic phlebotomies as needed.  If complex heterozygosity is noted, then goal would be to maintain ferritin closer to 200, depending on the mutation analysis.  Additional testing  including echocardiogram will be pursued as needed.  - Will schedule follow-up appointment in three months   Fibrocystic breast changes Presence of a small, hard lump on the breast for three months, initially thought to be a blood bite. Examination suggests fibrocystic changes with dense breast tissue. No immediate concern for malignancy. 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 No current alcohol use. - Continue abstinence from alcohol  I reviewed lab results and outside records for this visit and discussed relevant results with the patient. Diagnosis, plan of care and treatment options were also discussed in detail with the patient. Opportunity provided to ask questions and answers provided to her apparent satisfaction. Provided instructions to call our clinic with any problems, questions or concerns prior to return visit. I recommended to continue follow-up with PCP and sub-specialists. She verbalized understanding and agreed with the plan. No barriers to learning was detected.  Chinita Patten, MD  08/22/2024 5:23 PM  [facility] - A DEPT OF [facility] [id number] [place] [address] Dept: [phone] Dept Fax: [phone]   HISTORY OF PRESENT ILLNESS:   Discussed the use of AI scribe software for clinical note transcription with the patient, who gave verbal consent to proceed.  History of Present Illness Brandi Kirk is a 38 year old female with liver failure who presents with elevated ferritin levels.   On 07/04/2024, labs showed ferritin of 435.  Hence she was referred to us  for further evaluation.  On review of records, she has had chronically elevated ferritin  at least since September 2020 with peak of 919 in August 2021, range 290-919.  The peak was 919 in August 2021 during a  hospitalization for liver failure due to severe alcoholism. She has been abstinent from alcohol for four years and has quit smoking. No fevers, chills, or night sweats. She does not take any over-the-counter supplements or multivitamins.  She has been on Zepbound  for weight loss for almost a year and metformin, resulting in a weight loss of approximately 140 pounds over two years. No gastrointestinal side effects or skin rashes from these medications. She experiences nausea, particularly when stressed or if she does not eat at certain times, but denies vomiting. Her bowel movements are irregular, but she does not experience constipation or diarrhea, and there is no blood in her stools.  She reports a small, hard lump on her breast that has been present for about three months, initially thought to be a bug bite. She had a mammogram a couple of years ago due to suspicious lumps.    MEDICAL HISTORY Past Medical History:  Diagnosis Date   Alcohol addiction (HCC)    Alcoholic intoxication with complication 11/15/2019   Anemia    Anxiety    Aspiration pneumonitis (HCC) 03/27/2021   B12 deficiency    Back pain 03/18/2016   Bilateral lower extremity edema 11/11/2019   Bipolar 1 disorder (HCC)    Blood in stool 07/2017   internal hemorrhoid    Chicken pox    Cirrhosis with alcoholism (HCC)    Depression    Fatty liver    Fractured rib 2019   Frequent headaches    Frequent UTI    GERD (gastroesophageal reflux disease)    High cholesterol    HPV in female    Hypertension    Hypomagnesemia 11/17/2017   Joint pain    Migraine    Multiple closed fractures of ribs of right side 03/15/2018   Obesity    PCOS (polycystic ovarian syndrome)    Prediabetes    Vitamin D  deficiency    Vocal cord dysfunction      SURGICAL HISTORY Past Surgical History:  Procedure Laterality Date   WISDOM TOOTH EXTRACTION       SOCIAL HISTORY: She reports that she has quit smoking. Her smoking use included  cigarettes and e-cigarettes. She has a 3.8 pack-year smoking history. She has never used smokeless tobacco. She reports that she does not currently use alcohol. She reports that she does not use drugs. Social History   Socioeconomic History   Marital status: Married    Spouse name: Not on file   Number of children: 1   Years of education: Not on file   Highest education level: Not on file  Occupational History   Occupation: stay at home mom  Tobacco Use   Smoking status: Former    Current packs/day: 0.25    Average packs/day: 0.3 packs/day for 15.0 years (3.8 ttl pk-yrs)    Types: Cigarettes, E-cigarettes   Smokeless tobacco: Never   Tobacco comments:    QUIT 2022  Vaping Use   Vaping status: Every Day   Start date: 09/29/2020   Substances: Nicotine, Flavoring  Substance and Sexual Activity   Alcohol use: Not Currently    Comment: Quit in 2021   Drug use: No   Sexual activity: Yes    Partners: Male    Birth control/protection: I.U.D.  Other Topics Concern   Not on file  Social History Narrative   Married.  1 child.    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    Alcohol abuse. Smoker.   Smoke alarm in the home   feels safe in her relationships.    Social Drivers of Corporate Investment Banker Strain: Low Risk  (03/11/2021)   Received [facility]   Overall Financial Resource Strain (CARDIA)    Difficulty of Paying Living Expenses: Not very hard  Food Insecurity: No Food Insecurity (08/22/2024)   Hunger Vital Sign    Worried About Running Out of Food in the Last Year: Never true    Ran Out of Food in the Last Year: Never true  Transportation Needs: No Transportation Needs (08/22/2024)   PRAPARE - Administrator, Civil Service (Medical): No    Lack of Transportation (Non-Medical): No  Physical Activity: Not on file  Stress: Unknown (08/20/2021)   Received from [facility] of Occupational Health - Occupational Stress Questionnaire     Feeling of Stress : Patient declined  Social Connections: Unknown (01/27/2022)   Received [facility]   Social Network    Social Network: Not on file  Intimate Partner Violence: Not At Risk (08/22/2024)   Humiliation, Afraid, Rape, and Kick questionnaire    Fear of Current or Ex-Partner: No    Emotionally Abused: No    Physically Abused: No    Sexually Abused: No    FAMILY HISTORY: Her family history includes Alcohol abuse in her brother, father, maternal grandfather, mother, and sister; Arthritis in her maternal grandfather, maternal grandmother, mother, paternal grandfather, and paternal grandmother; Asthma in her father, mother, and sister; Bipolar disorder in her sister; Birth defects in her brother and maternal grandmother; Cirrhosis in her sister; Depression in her brother, brother, father, and mother; Diabetes in her maternal grandfather and mother; Early death in her mother; Epilepsy in her mother; Heart murmur in her brother, mother, and sister; Hyperlipidemia in her maternal grandfather, maternal grandmother, and mother; Hypertension in her mother; Kidney disease in her mother; Mental illness in her brother, brother, brother, father, and mother; Rheum arthritis in her sister; Stroke in her maternal grandfather; Stroke (age of onset: 54) in her mother.  CURRENT MEDICATIONS   Current Outpatient Medications  Medication Instructions   ALPRAZolam (XANAX) 0.25 mg, 2 times daily PRN   buPROPion  (WELLBUTRIN  SR) 150 mg, Oral, 2 times daily   clonazePAM (KLONOPIN) 1 mg, 2 times daily PRN   lamoTRIgine (LAMICTAL) 50 mg, Daily   lamoTRIgine (LAMICTAL) 150 mg, Oral, Daily, Patient is going to be on this dose on or around December 3rd, 2025   levonorgestrel  (MIRENA ) 20 MCG/24HR IUD 1 each, Continuous   ondansetron  (ZOFRAN -ODT) 4 mg, Oral, Every 8 hours PRN   valACYclovir  (VALTREX ) 1000 MG tablet 1g PO daily for 5 days, then 1/2 tab po qd x3 days prn   Vitamin D  (Ergocalciferol ) (DRISDOL )  50,000 Units, Oral, Every 7 days   Zepbound  12.5 mg, Subcutaneous, Weekly     ALLERGIES  She is allergic to ciprofloxacin and sulfamethoxazole-trimethoprim.  REVIEW OF SYSTEMS:  Review of Systems - Oncology   Rest of the pertinent review of systems is unremarkable except as mentioned above in HPI.  PHYSICAL EXAMINATION:   Onc Performance Status - 08/22/24 1319       ECOG Perf Status   ECOG Perf Status Ambulatory and capable of all selfcare but unable to carry out any work activities.  Up and about more than 50% of waking hours  KPS SCALE   KPS % SCORE Normal activity with effort, some s/s of disease          Vitals:   08/22/24 1310  BP: 118/81  Pulse: 93  Resp: 16  Temp: 97.7 F (36.5 C)  SpO2: 99%   Filed Weights   08/22/24 1310  Weight: 155 lb 8 oz (70.5 kg)    Physical Exam Constitutional:      General: She is not in acute distress.    Appearance: Normal appearance.  HENT:     Head: Normocephalic and atraumatic.  Cardiovascular:     Rate and Rhythm: Normal rate.     Heart sounds: Normal heart sounds.  Pulmonary:     Effort: Pulmonary effort is normal. No respiratory distress.     Breath sounds: Normal breath sounds.  Chest:     Comments: Fibroglandular texture of bilateral breasts.  Otherwise no concerning lesions.  No axillary lymphadenopathy. Abdominal:     General: There is no distension.  Neurological:     General: No focal deficit present.     Mental Status: She is alert and oriented to person, place, and time.  Psychiatric:        Mood and Affect: Mood normal.        Behavior: Behavior normal.      LABORATORY DATA:   I have reviewed the data as listed.  Results for orders placed or performed in visit on 08/22/24  Ferritin  Result Value Ref Range   Ferritin 507 (H) 11 - 307 ng/mL  Lactate dehydrogenase  Result Value Ref Range   LDH 131 105 - 235 U/L  CMP (Cancer Center only)  Result Value Ref Range   Sodium 140 135 - 145  mmol/L   Potassium 3.8 3.5 - 5.1 mmol/L   Chloride 104 98 - 111 mmol/L   CO2 24 22 - 32 mmol/L   Glucose, Bld 81 70 - 99 mg/dL   BUN 9 6 - 20 mg/dL   Creatinine 9.30 9.55 - 1.00 mg/dL   Calcium 9.8 8.9 - 89.6 mg/dL   Total Protein 7.5 6.5 - 8.1 g/dL   Albumin 4.8 3.5 - 5.0 g/dL   AST 14 (L) 15 - 41 U/L   ALT 17 0 - 44 U/L   Alkaline Phosphatase 52 38 - 126 U/L   Total Bilirubin 1.6 (H) 0.0 - 1.2 mg/dL   GFR, Estimated >39 >39 mL/min   Anion gap 12 5 - 15  CBC with Differential (Cancer Center Only)  Result Value Ref Range   WBC Count 6.5 4.0 - 10.5 K/uL   RBC 3.95 3.87 - 5.11 MIL/uL   Hemoglobin 12.1 12.0 - 15.0 g/dL   HCT 65.0 (L) 63.9 - 53.9 %   MCV 88.4 80.0 - 100.0 fL   MCH 30.6 26.0 - 34.0 pg   MCHC 34.7 30.0 - 36.0 g/dL   RDW 87.9 88.4 - 84.4 %   Platelet Count 273 150 - 400 K/uL   nRBC 0.0 0.0 - 0.2 %   Neutrophils Relative % 54 %   Neutro Abs 3.5 1.7 - 7.7 K/uL   Lymphocytes Relative 37 %   Lymphs Abs 2.4 0.7 - 4.0 K/uL   Monocytes Relative 7 %   Monocytes Absolute 0.4 0.1 - 1.0 K/uL   Eosinophils Relative 1 %   Eosinophils Absolute 0.1 0.0 - 0.5 K/uL   Basophils Relative 1 %   Basophils Absolute 0.0 0.0 - 0.1 K/uL   Immature Granulocytes  0 %   Abs Immature Granulocytes 0.01 0.00 - 0.07 K/uL     RADIOGRAPHIC STUDIES:  I have personally reviewed the radiological images as listed and agreed with the findings in the report.  CT Abdomen Pelvis Wo Contrast CLINICAL DATA:  Periumbilical abdominal pain  EXAM: CT ABDOMEN AND PELVIS WITHOUT CONTRAST  TECHNIQUE: Multidetector CT imaging of the abdomen and pelvis was performed following the standard protocol without IV contrast.  RADIATION DOSE REDUCTION: This exam was performed according to the departmental dose-optimization program which includes automated exposure control, adjustment of the mA and/or kV according to patient size and/or use of iterative reconstruction technique.  COMPARISON:   11/02/2008  FINDINGS: Lower chest: No acute abnormality.  Hepatobiliary: No solid liver abnormality is seen. No gallstones, gallbladder wall thickening, or biliary dilatation.  Pancreas: Unremarkable. No pancreatic ductal dilatation or surrounding inflammatory changes.  Spleen: Normal in size without significant abnormality.  Adrenals/Urinary Tract: Adrenal glands are unremarkable. Kidneys are normal, without renal calculi, solid lesion, or hydronephrosis. Bladder is unremarkable.  Stomach/Bowel: Stomach is within normal limits. Appendix normal. No evidence of bowel wall thickening, distention, or inflammatory changes. Moderate burden of stool throughout the colon.  Vascular/Lymphatic: No significant vascular findings are present. No enlarged abdominal or pelvic lymph nodes.  Reproductive: No mass or other significant abnormality. IUD present in the fundal endometrial cavity.  Other: Fat containing umbilical hernia with some associated internal fat stranding, hernia sac measuring 4.0 cm, hernia neck measuring 1.5 cm (series 3, image 47). No ascites.  Musculoskeletal: No acute or significant osseous findings.  IMPRESSION: 1. Fat containing umbilical hernia with some associated fat stranding, suggestive of incarceration or strangulation and potentially symptomatic. Hernia sac measuring 4.0 cm, hernia neck measuring 1.5 cm. 2. No other acute noncontrast CT findings of the abdomen or pelvis to explain abdominal pain. Normal appendix. 3. Moderate burden of stool throughout the colon.  These results will be called to the ordering clinician or representative by the Radiologist Assistant, and communication documented in the PACS or [employer].  Electronically Signed   By: Marolyn JONETTA Jaksch M.D.   On: 01/17/2023 15:02   Orders Placed This Encounter  Procedures   CBC with Differential (Cancer Center Only)    Standing Status:   Future    Number of Occurrences:   1     Expiration Date:   08/22/2025   CMP (Cancer Center only)    Standing Status:   Future    Number of Occurrences:   1    Expiration Date:   08/22/2025   Lactate dehydrogenase    Standing Status:   Future    Number of Occurrences:   1    Expiration Date:   08/22/2025   Iron and TIBC    Standing Status:   Future    Number of Occurrences:   1    Expiration Date:   08/22/2025   Ferritin    Standing Status:   Future    Number of Occurrences:   1    Expiration Date:   08/22/2025   Hemochromatosis DNA, PCR    Standing Status:   Future    Number of Occurrences:   1    Expiration Date:   08/22/2025    Future Appointments  Date Time Provider Department Center  09/06/2024  7:15 AM Becki Krabbe, FNP [facility] None  09/06/2024  3:45 PM Jamarea Selner, Chinita, MD [facility] None  11/14/2024  3:15 PM [facility] PHLEBOTOMIST [facility] None  11/14/2024  3:45 PM  Zayna Toste, MD [facility] None    I spent a total of 55 minutes during this encounter with the patient including review of chart and various tests results, discussions about plan of care and coordination of care plan.  This document was completed utilizing speech recognition software. Grammatical errors, random word insertions, pronoun errors, and incomplete sentences are an occasional consequence of this system due to software limitations, ambient noise, and hardware issues. Any formal questions or concerns about the content, text or information contained within the body of this dictation should be directly addressed to the provider for clarification.

## 2024-08-29 LAB — HEMOCHROMATOSIS DNA-PCR(C282Y,H63D)

## 2024-09-06 ENCOUNTER — Inpatient Hospital Stay: Attending: Oncology | Admitting: Oncology

## 2024-09-06 ENCOUNTER — Encounter: Payer: Self-pay | Admitting: Oncology

## 2024-09-06 ENCOUNTER — Telehealth: Admitting: Nurse Practitioner

## 2024-09-06 DIAGNOSIS — E559 Vitamin D deficiency, unspecified: Secondary | ICD-10-CM

## 2024-09-06 DIAGNOSIS — R7989 Other specified abnormal findings of blood chemistry: Secondary | ICD-10-CM | POA: Insufficient documentation

## 2024-09-06 DIAGNOSIS — R638 Other symptoms and signs concerning food and fluid intake: Secondary | ICD-10-CM

## 2024-09-06 DIAGNOSIS — R632 Polyphagia: Secondary | ICD-10-CM

## 2024-09-06 DIAGNOSIS — Z148 Genetic carrier of other disease: Secondary | ICD-10-CM | POA: Insufficient documentation

## 2024-09-06 DIAGNOSIS — E669 Obesity, unspecified: Secondary | ICD-10-CM

## 2024-09-06 MED ORDER — ZEPBOUND 12.5 MG/0.5ML ~~LOC~~ SOLN: 12.5000 mg | SUBCUTANEOUS | 0 refills | Status: DC

## 2024-09-06 MED ORDER — VITAMIN D (ERGOCALCIFEROL) 1.25 MG (50000 UNIT) PO CAPS: 50000.0000 [IU] | ORAL_CAPSULE | ORAL | 0 refills | Status: DC

## 2024-09-06 MED ORDER — BUPROPION HCL ER (SR) 150 MG PO TB12: 150.0000 mg | ORAL_TABLET | Freq: Two times a day (BID) | ORAL | 0 refills | Status: DC

## 2024-09-06 NOTE — Progress Notes (Signed)
 [facility]  HEMATOLOGY-ONCOLOGY ELECTRONIC VISIT PROGRESS NOTE  PATIENT NAME: Brandi Kirk   MR#: [id number] DOB: May 11, 1986  DATE OF SERVICE: 09/06/2024  Patient Care Team: Early, Camie BRAVO, NP as PCP - General (Nurse Practitioner) Sarrah Browning, MD as Consulting Physician (Obstetrics and Gynecology) Lindaann Dunnings, MD as Referring Physician (Internal Medicine) Becki Krabbe, FNP (Nurse Practitioner)  I connected with the patient via telephone conference and verified that I am speaking with the correct person using two identifiers. The patient's location is at home and I am providing care from the [facility].  I discussed the limitations, risks, security and privacy concerns of performing an evaluation and management service by e-visits and the availability of in person appointments. I also discussed with the patient that there may be a patient responsible charge related to this service. The patient expressed understanding and agreed to proceed.   ASSESSMENT & PLAN:   Brandi Kirk is a 38 y.o.  lady with a past medical history of hypertension, dyslipidemia, PCOS, prediabetes, history of HPV, B12 deficiency, bipolar disorder, Hx of alcohol dependency, was referred to our service in November 2025 for evaluation of elevated ferritin.    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 Recent ferritin level was 435 on 07/04/2024.   No family history of hemochromatosis. No symptoms of iron overload such as nausea, vomiting, or gastrointestinal issues. Liver function tests are preserved.   On her consultation with us  on 08/23/2019, labs showed unremarkable CBCD and CMP except for total bilirubin of 1.6.  Rest of the LFTs were normal.  LDH normal.  Ferritin was elevated at 507.  Iron studies were unremarkable with iron saturation of 24%, iron of  62.  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  No evidence of C282Y or S65C mutations.  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  It does not cause full-blown hemochromatosis.  At least a carrier state.  No consensus goal ferritin in this situation. We will try to keep ferritin below 200 with therapeutic phlebotomies as needed.  Since recent ferritin was 507, we will proceed with therapeutic phlebotomy this week.  RTC in February 2026 as scheduled for repeat labs and possible therapeutic phlebotomy.   I discussed the assessment and treatment plan with the patient. The patient was provided an opportunity to ask questions and all were answered. The patient agreed with the plan and demonstrated an understanding of the instructions. The patient was advised to call back or seek an in-person evaluation if the symptoms worsen or if the condition fails to improve as anticipated.    I spent 15 minutes over the phone with the patient reviewing test results, discuss management and coordination/planning of care.  Chinita Patten, MD 09/06/2024 2:40 PM [facility] - A DEPT OF [facility] [address] [place] [address] Dept: [phone] Dept Fax: [phone]   INTERVAL HISTORY:  Please see above for problem oriented charting.  The purpose of today's discussion is to explain recent lab results and to formulate plan of care.  Discussed the use of AI scribe software for clinical note transcription with the patient, who gave verbal consent to proceed.  History of Present Illness Brandi Kirk is a 38 year old female who presents for follow-up on elevated ferritin levels.  She has experienced fluctuating ferritin levels,  with the most recent measurement at 507 ng/mL. Previously, her ferritin reached a high of 919 ng/mL in August 2021 during a hospitalization for severe  illness.  Recent laboratory tests showed normal white and red blood cell counts, platelet count, and chemistries. Total bilirubin was slightly elevated at 1.6 mg/dL, while other liver function tests, kidney function, and electrolytes were within normal limits.  Genetic testing for hemochromatosis indicated heterozygosity for the H63D mutation, with no detection of the C282Y mutation. There is a one in four chance that her children could inherit the carrier state for hemochromatosis.    SUMMARY OF HEMATOLOGY HISTORY:  On 07/04/2024, labs showed ferritin of 435.  Hence she was referred to us  for further evaluation.   On review of records, she has had chronically elevated ferritin at least since September 2020 with peak of 919 in August 2021, range 290-919.   The peak was 919 in August 2021 during a hospitalization for liver failure due to severe alcoholism. She has been abstinent from alcohol for four years and has quit smoking. No fevers, chills, or night sweats. She does not take any over-the-counter supplements or multivitamins.   She has been on Zepbound  for weight loss for almost a year and metformin, resulting in a weight loss of approximately 140 pounds over two years. No gastrointestinal side effects or skin rashes from these medications. She experiences nausea, particularly when stressed or if she does not eat at certain times, but denies vomiting. Her bowel movements are irregular, but she does not experience constipation or diarrhea, and there is no blood in her stools.   She reports a small, hard lump on her breast that has been present for about three months, initially thought to be a bug bite. She had a mammogram a couple of years ago due to suspicious lumps.  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 Recent ferritin level was 435 on 07/04/2024.    No family history of  hemochromatosis. No symptoms of iron overload such as nausea, vomiting, or gastrointestinal issues. Liver function tests are preserved.   On her consultation with us  on 08/23/2019, labs showed unremarkable CBCD and CMP except for total bilirubin of 1.6.  Rest of the LFTs were normal.  LDH normal.  Ferritin was elevated at 507.  Iron studies were unremarkable with iron saturation of 24%, iron of 62.  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  No evidence of C282Y or S65C mutations.  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  It does not cause full-blown hemochromatosis.  At least a carrier state.  No consensus goal ferritin in this situation. We will try to keep ferritin below 200 with therapeutic phlebotomies as needed.  REVIEW OF SYSTEMS:    Review of Systems - Oncology  All other pertinent systems were reviewed with the patient and are negative.  I have reviewed the past medical history, past surgical history, social history and family history with the patient and they are unchanged from previous note.  ALLERGIES:  She is allergic to ciprofloxacin and sulfamethoxazole-trimethoprim.  MEDICATIONS:  Current Outpatient Medications  Medication Sig Dispense Refill   ALPRAZolam (XANAX) 0.25 MG tablet Take 0.25 mg by mouth 2 (two) times daily as needed.     buPROPion  (WELLBUTRIN  SR) 150 MG 12 hr tablet Take 1 tablet (150 mg total) by mouth 2 (two) times daily. 60 tablet 0  clonazePAM (KLONOPIN) 1 MG disintegrating tablet Take 1 mg by mouth 2 (two) times daily as needed.     lamoTRIgine (LAMICTAL) 150 MG tablet Take 150 mg by mouth daily. Patient is going to be on this dose on or around December 3rd, 2025     lamoTRIgine (LAMICTAL) 25 MG tablet Take 50 mg by mouth daily.     levonorgestrel  (MIRENA ) 20 MCG/24HR IUD 1 each by Intrauterine route continuous.      ondansetron  (ZOFRAN -ODT) 4 MG disintegrating tablet Take 1 tablet (4 mg total) by  mouth every 8 (eight) hours as needed for nausea or vomiting. 30 tablet 1   Tirzepatide -Weight Management (ZEPBOUND ) 12.5 MG/0.5ML SOLN Inject 12.5 mg into the skin once a week. 2 mL 0   valACYclovir  (VALTREX ) 1000 MG tablet 1g PO daily for 5 days, then 1/2 tab po qd x3 days prn (Patient taking differently: Take by mouth. 1g PO daily for 5 days, then 1/2 tab po qd x3 days prn) 30 tablet 1   Vitamin D , Ergocalciferol , (DRISDOL ) 1.25 MG (50000 UNIT) CAPS capsule Take 1 capsule (50,000 Units total) by mouth every 7 (seven) days. 5 capsule 0   No current facility-administered medications for this visit.    PHYSICAL EXAMINATION:  Not performed today as it was a phone only visit  LABORATORY DATA:   I have reviewed the data as listed.  Recent Results (from the past 2160 hours)  Comprehensive metabolic panel with GFR     Status: None   Collection Time: 07/04/24  7:53 AM  Result Value Ref Range   Glucose 76 70 - 99 mg/dL   BUN 9 6 - 20 mg/dL   Creatinine, Ser 9.32 0.57 - 1.00 mg/dL   eGFR 883 >40 fO/fpw/8.26   BUN/Creatinine Ratio 13 9 - 23   Sodium 137 134 - 144 mmol/L   Potassium 4.2 3.5 - 5.2 mmol/L   Chloride 102 96 - 106 mmol/L   CO2 23 20 - 29 mmol/L   Calcium 9.1 8.7 - 10.2 mg/dL   Total Protein 6.7 6.0 - 8.5 g/dL   Albumin 4.6 3.9 - 4.9 g/dL   Globulin, Total 2.1 1.5 - 4.5 g/dL   Bilirubin Total 1.2 0.0 - 1.2 mg/dL   Alkaline Phosphatase 51 41 - 116 IU/L   AST 14 0 - 40 IU/L   ALT 16 0 - 32 IU/L  VITAMIN D  25 Hydroxy (Vit-D Deficiency, Fractures)     Status: Abnormal   Collection Time: 07/04/24  7:53 AM  Result Value Ref Range   Vit D, 25-Hydroxy 27.4 (L) 30.0 - 100.0 ng/mL    Comment: Vitamin D  deficiency has been defined by the Institute of Medicine and an Endocrine Society practice guideline as a level of serum 25-OH vitamin D  less than 20 ng/mL (1,2). The Endocrine Society went on to further define vitamin D  insufficiency as a level between 21 and 29 ng/mL (2). 1. IOM  (Institute of Medicine). 2010. Dietary reference    intakes for calcium and D. [place]: The    [employer]. 2. Holick MF, Binkley [place], Bischoff-Ferrari HA, et al.    Evaluation, treatment, and prevention of vitamin D     deficiency: an Endocrine Society clinical practice    guideline. JCEM. 2011 Jul; 96(7):1911-30.   Vitamin B12     Status: None   Collection Time: 07/04/24  7:53 AM  Result Value Ref Range   Vitamin B-12 575 232 - 1,245 pg/mL  CBC with Differential/Platelet  Status: None   Collection Time: 07/04/24  7:53 AM  Result Value Ref Range   WBC 5.3 3.4 - 10.8 x10E3/uL   RBC 4.11 3.77 - 5.28 x10E6/uL   Hemoglobin 12.6 11.1 - 15.9 g/dL   Hematocrit 60.3 65.9 - 46.6 %   MCV 96 79 - 97 fL   MCH 30.7 26.6 - 33.0 pg   MCHC 31.8 31.5 - 35.7 g/dL   RDW 88.0 88.2 - 84.5 %   Platelets 240 150 - 450 x10E3/uL   Neutrophils 51 Not Estab. %   Lymphs 37 Not Estab. %   Monocytes 8 Not Estab. %   Eos 3 Not Estab. %   Basos 1 Not Estab. %   Neutrophils Absolute 2.8 1.4 - 7.0 x10E3/uL   Lymphocytes Absolute 1.9 0.7 - 3.1 x10E3/uL   Monocytes Absolute 0.4 0.1 - 0.9 x10E3/uL   EOS (ABSOLUTE) 0.2 0.0 - 0.4 x10E3/uL   Basophils Absolute 0.0 0.0 - 0.2 x10E3/uL   Immature Granulocytes 0 Not Estab. %   Immature Grans (Abs) 0.0 0.0 - 0.1 x10E3/uL  Ferritin     Status: Abnormal   Collection Time: 07/04/24  7:53 AM  Result Value Ref Range   Ferritin 435 (H) 15 - 150 ng/mL  Iron     Status: None   Collection Time: 07/04/24  7:53 AM  Result Value Ref Range   Iron 99 27 - 159 ug/dL  Hemochromatosis DNA, PCR     Status: None   Collection Time: 08/22/24  2:26 PM  Result Value Ref Range   DNA Mutation Analysis Comment     Comment: (NOTE) Results: c.845G>A (p.Cys282Tyr) - Not Detected c.187C>G (p.His63Asp) - Detected, heterozygous c.193A>T (p.Ser65Cys) - Not Detected Not associated with increased risk to develop clinical symptoms of Hereditary Hemochromatosis. In  symptomatic individuals, other causes of iron overload should be evaluated. See Additional Information and Comments. Additional Clinical Information: Hereditary hemochromatosis (HFE related) is an autosomal recessive iron storage disorder. Patients may have a genetic diagnosis of hereditary hemochromatosis and never show clinical symptoms. Clinical symptoms typically appear between 40 to 60 years in males and after menopause in females. Signs and symptoms may include organ damage, primarily in the liver, risk for hepatocellular carcinoma, diabetes, and heart disease due to iron accumulation. Life expectancy may be decreased in individuals who develop cirrhosis. Treatment for clinically symptomatic individuals may include therapeutic ph lebotomy. Liver transplant may be used to treat end stage liver failure. For preventive care, monitoring for iron overload is recommended for patients who are homozygous for c.845G>A (p.Cys282Tyr) and have yet to experience clinical symptoms. Comments: The most common HFE variants associated with hereditary hemochromatosis are c.845G>A (p.Cys282Tyr), c.187C>G (p.His63Asp), c.193A>T (p.Ser65Cys). While patients homozygous for c.845G>A (p.Cys282Tyr) are the most likely to present clinical symptoms, less than 10% develop clinically significant iron overload with tissue and organ damage. Genetic counseling is recommended to discuss the potential clinical implications of positive results, as well as recommendations for testing family members. Genetic Coordinators are available for health care providers to discuss results at [phone]). Test Details: Three variants analyzed: c.845G>A (p.Cys282Tyr), commonly referred to as C282Y c.187C>G (p.His63Asp), commonly re ferred to as H63D c.193A>T (p.Ser65Cys), commonly referred to as S65C Methods/Limitations: DNA Analysis of the HFE gene ([id number]) was performed by PCR amplification followed by  restriction enzyme digestion analyses. Results must be combined with clinical information for the most accurate interpretation. Molecular-based testing is highly accurate, but as in any laboratory test, diagnostic errors may occur. False  positive or false negative results may occur for reasons that include genetic variants, blood transfusions, bone marrow transplantation, somatic or tissue-specific mosaicism, mislabeled samples, or erroneous representation of family relationships. This test was developed and its performance characteristics determined by [facility]. It has not been cleared or approved by the Food and Drug Administration. References: Aldona CAVES, [address], Kowdley SONNA Monte LW, Tavill AS; American Association for the Study of Liver Diseases. Diagnosis and management of hemochromat osis: 2011 practice guideline by the American Association for the Study of Liver Diseases. Hepatology. 2011 Jul;54(1):328-43. doi: 10.1002/hep.24330. PMID: 78547709; PMCID: EFR6850874. [address], Brissot P, Swinkels DW, Zoller H, Kamarainen O, Patton S, Alonso I, Morris M, Keeney S. EMQN best practice guidelines for the molecular genetic diagnosis of hereditary hemochromatosis [facility]). Eur J Hum Genet. 2016 Apr;24(4):479-95. doi: 10.1038/ejhg.2015.128. Epub 2015 Jul 8. PMID: 73846781; PMCID: EFR5070138.    Reviewed by: Comment     Comment: (NOTE) Technical Component performed at [facility] Professional Component performed by: Slater LELON Donate, PhD, [facility] [id number], [facility], [address] [place] [address] Performed At: [facility] [address] [place], [place] [id number] Loran Gales MDPhD Ey:[phone]   Ferritin     Status: Abnormal   Collection Time: 08/22/24  2:26 PM  Result Value Ref Range   Ferritin 507 (H) 11 - 307 ng/mL    Comment: Performed at [facility], [address] [place], [place] [address]  Iron and TIBC     Status: None   Collection Time: 08/22/24  2:26  PM  Result Value Ref Range   Iron 62 28 - 170 ug/dL   TIBC 739 749 - 549 ug/dL   Saturation Ratios 24 10.4 - 31.8 %   UIBC 198 ug/dL    Comment: Performed at [facility], [address] [place], [place] [address]  Lactate dehydrogenase     Status: None   Collection Time: 08/22/24  2:26 PM  Result Value Ref Range   LDH 131 105 - 235 U/L    Comment: Please note change in reference range. Performed at [facility], [address], [place], [place] [address]   CMP (Cancer Center only)     Status: Abnormal   Collection Time: 08/22/24  2:26 PM  Result Value Ref Range   Sodium 140 135 - 145 mmol/L   Potassium 3.8 3.5 - 5.1 mmol/L   Chloride 104 98 - 111 mmol/L   CO2 24 22 - 32 mmol/L   Glucose, Bld 81 70 - 99 mg/dL    Comment: Glucose reference range applies only to samples taken after fasting for at least 8 hours.   BUN 9 6 - 20 mg/dL   Creatinine 9.30 9.55 - 1.00 mg/dL   Calcium 9.8 8.9 - 89.6 mg/dL   Total Protein 7.5 6.5 - 8.1 g/dL   Albumin 4.8 3.5 - 5.0 g/dL   AST 14 (L) 15 - 41 U/L   ALT 17 0 - 44 U/L   Alkaline Phosphatase 52 38 - 126 U/L   Total Bilirubin 1.6 (H) 0.0 - 1.2 mg/dL   GFR, Estimated >39 >39 mL/min    Comment: (NOTE) Calculated using the CKD-EPI Creatinine Equation (2021)    Anion gap 12 5 - 15    Comment: Performed at [facility], [address], [place], [place] [address]  CBC with Differential (Cancer Center Only)     Status: Abnormal   Collection Time: 08/22/24  2:26 PM  Result Value  Ref Range   WBC Count 6.5 4.0 - 10.5 K/uL   RBC 3.95 3.87 - 5.11 MIL/uL   Hemoglobin 12.1 12.0 - 15.0 g/dL   HCT 65.0 (L) 63.9 - 53.9 %   MCV 88.4 80.0 - 100.0 fL   MCH 30.6 26.0 - 34.0 pg   MCHC 34.7 30.0 - 36.0 g/dL   RDW 87.9 88.4 - 84.4 %   Platelet Count 273 150 - 400 K/uL   nRBC 0.0 0.0 - 0.2 %   Neutrophils Relative % 54 %   Neutro Abs 3.5 1.7 - 7.7 K/uL   Lymphocytes Relative 37 %   Lymphs Abs 2.4 0.7 - 4.0  K/uL   Monocytes Relative 7 %   Monocytes Absolute 0.4 0.1 - 1.0 K/uL   Eosinophils Relative 1 %   Eosinophils Absolute 0.1 0.0 - 0.5 K/uL   Basophils Relative 1 %   Basophils Absolute 0.0 0.0 - 0.1 K/uL   Immature Granulocytes 0 %   Abs Immature Granulocytes 0.01 0.00 - 0.07 K/uL    Comment: Performed at [facility], [address] [place], [place] [address]     RADIOGRAPHIC STUDIES:  No recent pertinent imaging studies available to review.  Orders Placed This Encounter  Procedures   CBC with Differential (Cancer Center Only)    Standing Status:   Future    Expiration Date:   09/06/2025   CMP (Cancer Center only)    Standing Status:   Future    Expiration Date:   09/06/2025   Iron and TIBC    Standing Status:   Future    Expiration Date:   09/06/2025   Ferritin    Standing Status:   Future    Expiration Date:   09/06/2025     Future Appointments  Date Time Provider Department Center  09/06/2024  3:45 PM Autumn Millman, MD [facility] None  10/04/2024 12:15 PM Becki Krabbe, FNP [facility] None  11/01/2024  7:15 AM Becki Krabbe, FNP [facility] None  11/14/2024  3:15 PM [facility] PHLEBOTOMIST [facility] None  11/14/2024  3:45 PM Kima Malenfant, Millman, MD [facility] None  12/05/2024  7:15 AM Becki Krabbe, FNP [facility] None    This document was completed utilizing speech recognition software. Grammatical errors, random word insertions, pronoun errors, and incomplete sentences are an occasional consequence of this system due to software limitations, ambient noise, and hardware issues. Any formal questions or concerns about the content, text or information contained within the body of this dictation should be directly addressed to the provider for clarification.

## 2024-09-06 NOTE — Progress Notes (Signed)
 Office: [phone]  /  Fax: [phone]  WEIGHT SUMMARY AND BIOMETRICS  TeleHealth Visit:  This visit was completed with telemedicine (audio/video) technology. Essynce has verbally consented to this TeleHealth visit. The patient is located at her job in [place], the provider is located at [employer] [facility]. The participants in this visit include the listed provider and patient. The visit was conducted today via MyChart video.   HPI  Chief Complaint: OBESITY  Deshanae is here via video visit to discuss her progress with her obesity treatment plan.    Interval History:  Reported weight:  155 lbs  Her highest weight was 296 lbs.   She reports she did really well over thanksgiving.  She is eating more protein and small meals frequently.  She snacks on protein shakes, cheese sticks, cottage cheese, beef sticks and yogurt.  She rarely eats fruits.  She has been eating more vegetables.  She is drinking water and diet sodas daily.  Her new year resolution is to decrease to 1 soda daily.  She recently joined [employer] and is planning to go 2 days per week.   She is not worried about gaining weight over Christmas.     Pharmacotherapy for weight loss: She is currently taking Zepbound  12.5 mg for medical weight loss-7-10 days.  Denies side effects.     She is also taking Wellbutrin  SR 150mg  BID off label for cravings.  Denies side effects. Increased after last visit.     Previous pharmacotherapy for medical weight loss:    -She has tried Phentermine, Zepbound  and Qsymia . She stopped Phentermine due to side effects of feeling jittery.    Bariatric surgery:  She has not had bariatric surgery  Elevated ferritin Saw hematology last on 08/22/24 and has a follow up appt today.    Vit D deficiency  She is taking Vit D 50,000 IU weekly.  Denies side effects.  Denies nausea, vomiting or muscle weakness.    Lab Results  Component Value Date   VD25OH 27.4 (L) 07/04/2024   VD25OH 32.1  12/21/2023   VD25OH 38.3 06/25/2023     PHYSICAL EXAM:  There were no vitals taken for this visit. There is no height or weight on file to calculate BMI.  General: She is overweight, cooperative, alert, well developed, and in no acute distress. PSYCH: Has normal mood, affect and thought process.   Extremities: No edema.  Neurologic: No gross sensory or motor deficits. No tremors or fasciculations noted.    DIAGNOSTIC DATA REVIEWED:  BMET    Component Value Date/Time   NA 140 08/22/2024 1426   NA 137 07/04/2024 0753   K 3.8 08/22/2024 1426   CL 104 08/22/2024 1426   CO2 24 08/22/2024 1426   GLUCOSE 81 08/22/2024 1426   BUN 9 08/22/2024 1426   BUN 9 07/04/2024 0753   CREATININE 0.69 08/22/2024 1426   CREATININE 0.52 11/11/2019 1619   CALCIUM 9.8 08/22/2024 1426   GFRNONAA >60 08/22/2024 1426   GFRAA >60 03/22/2019 2328   Lab Results  Component Value Date   HGBA1C 5.3 05/30/2022   HGBA1C 5.2 11/25/2016   Lab Results  Component Value Date   INSULIN  9.2 11/27/2021   Lab Results  Component Value Date   TSH 1.070 12/21/2023   CBC    Component Value Date/Time   WBC 6.5 08/22/2024 1426   WBC 7.2 12/15/2023 1559   RBC 3.95 08/22/2024 1426   HGB 12.1 08/22/2024 1426   HGB 12.6 07/04/2024 0753  HCT 34.9 (L) 08/22/2024 1426   HCT 39.6 07/04/2024 0753   PLT 273 08/22/2024 1426   PLT 240 07/04/2024 0753   MCV 88.4 08/22/2024 1426   MCV 96 07/04/2024 0753   MCH 30.6 08/22/2024 1426   MCHC 34.7 08/22/2024 1426   RDW 12.0 08/22/2024 1426   RDW 11.9 07/04/2024 0753   Iron Studies    Component Value Date/Time   IRON 62 08/22/2024 1426   IRON 99 07/04/2024 0753   TIBC 260 08/22/2024 1426   TIBC 251 05/30/2022 0953   FERRITIN 507 (H) 08/22/2024 1426   FERRITIN 435 (H) 07/04/2024 0753   IRONPCTSAT 24 08/22/2024 1426   IRONPCTSAT 34 05/30/2022 0953   IRONPCTSAT 23 07/17/2021 0943   Lipid Panel     Component Value Date/Time   CHOL 134 12/21/2023 0748   TRIG  49 12/21/2023 0748   HDL 42 12/21/2023 0748   CHOLHDL 4.7 (H) 05/30/2022 0953   CHOLHDL 5 05/02/2020 1022   VLDL 36.4 05/02/2020 1022   LDLCALC 81 12/21/2023 0748   Hepatic Function Panel     Component Value Date/Time   PROT 7.5 08/22/2024 1426   PROT 6.7 07/04/2024 0753   ALBUMIN 4.8 08/22/2024 1426   ALBUMIN 4.6 07/04/2024 0753   AST 14 (L) 08/22/2024 1426   ALT 17 08/22/2024 1426   ALT 361 11/23/2017 0000   ALKPHOS 52 08/22/2024 1426   BILITOT 1.6 (H) 08/22/2024 1426   BILIDIR 0.1 11/11/2021 0947      Component Value Date/Time   TSH 1.070 12/21/2023 0748   Nutritional Lab Results  Component Value Date   VD25OH 27.4 (L) 07/04/2024   VD25OH 32.1 12/21/2023   VD25OH 38.3 06/25/2023     ASSESSMENT AND PLAN  TREATMENT PLAN FOR OBESITY:  Recommended Dietary Goals  [place] is currently in the action stage of change. As such, her goal is to continue weight management plan. She has agreed to practicing portion control and making smarter food choices, such as increasing vegetables and decreasing simple carbohydrates.  Behavioral Intervention  We discussed the following Behavioral Modification Strategies today: increasing lean protein intake to established goals, decreasing simple carbohydrates , increasing vegetables, increasing fiber rich foods, work on meal planning and preparation, reading food labels , keeping healthy foods at home, celebration eating strategies, continue to work on maintaining a reduced calorie state, getting the recommended amount of protein, incorporating whole foods, making healthy choices, staying well hydrated and practicing mindfulness when eating., and increase protein intake, fibrous foods (25 grams per day for women, 30 grams for men) and water to improve satiety and decrease hunger signals. .  Additional resources provided today: NA  Recommended Physical Activity Goals  Landi has been advised to work up to 150 minutes of moderate intensity  aerobic activity a week and strengthening exercises 2-3 times per week for cardiovascular health, weight loss maintenance and preservation of muscle mass.   She has agreed to Think about enjoyable ways to increase daily physical activity and overcoming barriers to exercise, Increase physical activity in their day and reduce sedentary time (increase NEAT)., Work on scheduling and tracking physical activity. , Continue to gradually increase the amount and intensity of exercise routine, Increase volume of physical activity to a goal of 240 minutes a week, and Combine aerobic and strengthening exercises for efficiency and improved cardiometabolic health.   Pharmacotherapy We discussed various medication options to help Marshall with her weight loss efforts and we both agreed to continue Zepbound  12.5mg .  side effects discussed.  ASSOCIATED CONDITIONS ADDRESSED TODAY  Action/Plan  Elevated ferritin Keep appt with hematology today  Vit D def Continue Vit D weekly.  Refill done.    Abnormal cravings Continue Wellbutrin  SR 150mg  BID.  Refill done.   Polyphagia -     Zepbound ; Inject 12.5 mg into the skin once a week.  Dispense: 2 mL; Refill: 0  Generalized obesity -     Zepbound ; Inject 12.5 mg into the skin once a week.  Dispense: 2 mL; Refill: 0         Return in about 4 weeks (around 10/04/2024).SABRA She was informed of the importance of frequent follow up visits to maximize her success with intensive lifestyle modifications for her multiple health conditions.   ATTESTASTION STATEMENTS:  Reviewed by clinician on day of visit: allergies, medications, problem list, medical history, surgical history, family history, social history, and previous encounter notes.     Corean SAUNDERS. Carolynne Schuchard FNP-C

## 2024-09-06 NOTE — Assessment & Plan Note (Addendum)
 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 Recent ferritin level was 435 on 07/04/2024.   No family history of hemochromatosis. No symptoms of iron overload such as nausea, vomiting, or gastrointestinal issues. Liver function tests are preserved.   On her consultation with us  on 08/23/2019, labs showed unremarkable CBCD and CMP except for total bilirubin of 1.6.  Rest of the LFTs were normal.  LDH normal.  Ferritin was elevated at 507.  Iron studies were unremarkable with iron saturation of 24%, iron of 62.  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  No evidence of C282Y or S65C mutations.  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  It does not cause full-blown hemochromatosis.  At least a carrier state.  No consensus goal ferritin in this situation. We will try to keep ferritin below 200 with therapeutic phlebotomies as needed.  Since recent ferritin was 507, we will proceed with therapeutic phlebotomy this week.  RTC in February 2026 as scheduled for repeat labs and possible therapeutic phlebotomy.

## 2024-09-12 ENCOUNTER — Encounter: Payer: Self-pay | Admitting: Oncology

## 2024-09-13 ENCOUNTER — Telehealth: Payer: Self-pay

## 2024-09-13 NOTE — Telephone Encounter (Signed)
 Followed up with patient via phone call in response to her question on MyChart. Patient wanted to know if she needed accompaniment for her upcoming phlebotomy appointment. Patient was informed that she can drive herself unaccompanied to these appointments. Patient understood and agreed.

## 2024-09-16 ENCOUNTER — Inpatient Hospital Stay

## 2024-09-16 VITALS — BP 100/74 | HR 71 | Temp 98.3°F | Resp 18

## 2024-09-16 DIAGNOSIS — R7989 Other specified abnormal findings of blood chemistry: Secondary | ICD-10-CM | POA: Diagnosis present

## 2024-09-16 DIAGNOSIS — Z148 Genetic carrier of other disease: Secondary | ICD-10-CM

## 2024-09-16 NOTE — Patient Instructions (Signed)

## 2024-09-16 NOTE — Progress Notes (Signed)
 Brandi Kirk presents today for phlebotomy per MD orders. Phlebotomy procedure started at 0955 and ended at 1002. 508 cc removed with a standard 16G kit Patient tolerated procedure well and provided with post procedure nourishment. IV needle removed intact. Pt waited 30 minutes post procedure. VSS

## 2024-10-04 ENCOUNTER — Ambulatory Visit: Admitting: Nurse Practitioner

## 2024-10-04 ENCOUNTER — Encounter: Payer: Self-pay | Admitting: Nurse Practitioner

## 2024-10-04 VITALS — BP 105/74 | HR 86 | Ht 63.0 in | Wt 147.0 lb

## 2024-10-04 DIAGNOSIS — R632 Polyphagia: Secondary | ICD-10-CM

## 2024-10-04 DIAGNOSIS — E669 Obesity, unspecified: Secondary | ICD-10-CM

## 2024-10-04 DIAGNOSIS — E559 Vitamin D deficiency, unspecified: Secondary | ICD-10-CM

## 2024-10-04 DIAGNOSIS — Z6826 Body mass index (BMI) 26.0-26.9, adult: Secondary | ICD-10-CM | POA: Diagnosis not present

## 2024-10-04 DIAGNOSIS — R7989 Other specified abnormal findings of blood chemistry: Secondary | ICD-10-CM | POA: Diagnosis not present

## 2024-10-04 MED ORDER — ZEPBOUND 12.5 MG/0.5ML ~~LOC~~ SOLN: 12.5000 mg | SUBCUTANEOUS | 0 refills | Status: DC

## 2024-10-04 MED ORDER — VITAMIN D (ERGOCALCIFEROL) 1.25 MG (50000 UNIT) PO CAPS: 50000.0000 [IU] | ORAL_CAPSULE | ORAL | 0 refills | Status: AC

## 2024-10-04 NOTE — Progress Notes (Signed)
 "  Office: [phone]  /  Fax: [phone]  WEIGHT SUMMARY AND BIOMETRICS  Weight Lost Since Last Visit: 11lb  Weight Gained Since Last Visit: 0   Vitals BP: 105/74 Pulse Rate: 86 SpO2: 100 %   Anthropometric Measurements Height: 5' 3 (1.6 m) Weight: 147 lb (66.7 kg) BMI (Calculated): 26.05 Weight at Last Visit: 158lb Weight Lost Since Last Visit: 11lb Weight Gained Since Last Visit: 0 Starting Weight: 262lb Total Weight Loss (lbs): 115 lb (52.2 kg)   Body Composition  Body Fat %: 34.9 % Fat Mass (lbs): 51.4 lbs Muscle Mass (lbs): 91 lbs Total Body Water (lbs): 70 lbs Visceral Fat Rating : 5   Other Clinical Data Fasting: no Labs: no Today's Visit #: 33 Starting Date: 11/27/21     HPI  Chief Complaint: OBESITY  Brandi Kirk is here to discuss her progress with her obesity treatment plan. She is on the the Category 1 Plan and states she is following her eating plan approximately 85-90 % of the time. She states she is exercising 60 minutes 2 days per week.   Interval History:  Since last office visit she has lost 11 pounds.  She had a stressful holiday and was skipping meals at times.  She started going back to the gym this week and feels that she has gotten back on track with her meal plan.  Her goal is to go 3 days per week.  She plans to focus on resistance training.   Pharmacotherapy for weight loss: She is currently taking Zepbound  12.5 mg for medical weight loss-7-10 days.  Denies side effects.     She is also taking Wellbutrin  SR 150mg  BID off label for cravings.  Denies side effects.    Previous pharmacotherapy for medical weight loss:    -She has tried Phentermine, Zepbound  and Qsymia .  -She stopped Phentermine due to side effects of feeling jittery.    Bariatric surgery:  She has not had bariatric surgery  Vit D deficiency  She is taking Vit D 50,000 IU weekly.  Denies side effects.  Denies nausea, vomiting or muscle weakness.    Lab Results   Component Value Date   VD25OH 27.4 (L) 07/04/2024   VD25OH 32.1 12/21/2023   VD25OH 38.3 06/25/2023    Elevated ferritin Saw hematology last on 09/06/24 and was diagnosed with hemochromatosis.  She had a phlebotomy on 09/16/24.    PHYSICAL EXAM:  Blood pressure 105/74, pulse 86, height 5' 3 (1.6 m), weight 147 lb (66.7 kg), SpO2 100%. Body mass index is 26.04 kg/m.  General: She is overweight, cooperative, alert, well developed, and in no acute distress. PSYCH: Has normal mood, affect and thought process.   Extremities: No edema.  Neurologic: No gross sensory or motor deficits. No tremors or fasciculations noted.    DIAGNOSTIC DATA REVIEWED:  BMET    Component Value Date/Time   NA 140 08/22/2024 1426   NA 137 07/04/2024 0753   K 3.8 08/22/2024 1426   CL 104 08/22/2024 1426   CO2 24 08/22/2024 1426   GLUCOSE 81 08/22/2024 1426   BUN 9 08/22/2024 1426   BUN 9 07/04/2024 0753   CREATININE 0.69 08/22/2024 1426   CREATININE 0.52 11/11/2019 1619   CALCIUM 9.8 08/22/2024 1426   GFRNONAA >60 08/22/2024 1426   GFRAA >60 03/22/2019 2328   Lab Results  Component Value Date   HGBA1C 5.3 05/30/2022   HGBA1C 5.2 11/25/2016   Lab Results  Component Value Date   INSULIN  9.2  11/27/2021   Lab Results  Component Value Date   TSH 1.070 12/21/2023   CBC    Component Value Date/Time   WBC 6.5 08/22/2024 1426   WBC 7.2 12/15/2023 1559   RBC 3.95 08/22/2024 1426   HGB 12.1 08/22/2024 1426   HGB 12.6 07/04/2024 0753   HCT 34.9 (L) 08/22/2024 1426   HCT 39.6 07/04/2024 0753   PLT 273 08/22/2024 1426   PLT 240 07/04/2024 0753   MCV 88.4 08/22/2024 1426   MCV 96 07/04/2024 0753   MCH 30.6 08/22/2024 1426   MCHC 34.7 08/22/2024 1426   RDW 12.0 08/22/2024 1426   RDW 11.9 07/04/2024 0753   Iron Studies    Component Value Date/Time   IRON 62 08/22/2024 1426   IRON 99 07/04/2024 0753   TIBC 260 08/22/2024 1426   TIBC 251 05/30/2022 0953   FERRITIN 507 (H) 08/22/2024 1426    FERRITIN 435 (H) 07/04/2024 0753   IRONPCTSAT 24 08/22/2024 1426   IRONPCTSAT 34 05/30/2022 0953   IRONPCTSAT 23 07/17/2021 0943   Lipid Panel     Component Value Date/Time   CHOL 134 12/21/2023 0748   TRIG 49 12/21/2023 0748   HDL 42 12/21/2023 0748   CHOLHDL 4.7 (H) 05/30/2022 0953   CHOLHDL 5 05/02/2020 1022   VLDL 36.4 05/02/2020 1022   LDLCALC 81 12/21/2023 0748   Hepatic Function Panel     Component Value Date/Time   PROT 7.5 08/22/2024 1426   PROT 6.7 07/04/2024 0753   ALBUMIN 4.8 08/22/2024 1426   ALBUMIN 4.6 07/04/2024 0753   AST 14 (L) 08/22/2024 1426   ALT 17 08/22/2024 1426   ALT 361 11/23/2017 0000   ALKPHOS 52 08/22/2024 1426   BILITOT 1.6 (H) 08/22/2024 1426   BILIDIR 0.1 11/11/2021 0947      Component Value Date/Time   TSH 1.070 12/21/2023 0748   Nutritional Lab Results  Component Value Date   VD25OH 27.4 (L) 07/04/2024   VD25OH 32.1 12/21/2023   VD25OH 38.3 06/25/2023     ASSESSMENT AND PLAN  TREATMENT PLAN FOR OBESITY:  Recommended Dietary Goals  Brandi Kirk is currently in the action stage of change. As such, her goal is to continue weight management plan. She has agreed to the Category 3 Plan.  Behavioral Intervention  We discussed the following Behavioral Modification Strategies today: increasing lean protein intake to established goals, decreasing simple carbohydrates , increasing vegetables, increasing fiber rich foods, avoiding skipping meals, increasing water intake , work on meal planning and preparation, reading food labels , keeping healthy foods at home, planning for success, continue to work on maintaining a reduced calorie state, getting the recommended amount of protein, incorporating whole foods, making healthy choices, staying well hydrated and practicing mindfulness when eating., and increase protein intake, fibrous foods (25 grams per day for women, 30 grams for men) and water to improve satiety and decrease hunger signals.  .  Additional resources provided today: NA  Recommended Physical Activity Goals  Brandi Kirk has been advised to work up to 150 minutes of moderate intensity aerobic activity a week and strengthening exercises 2-3 times per week for cardiovascular health, weight loss maintenance and preservation of muscle mass.   She has agreed to Think about enjoyable ways to increase daily physical activity and overcoming barriers to exercise, Increase physical activity in their day and reduce sedentary time (increase NEAT)., Work on scheduling and tracking physical activity. , Continue to gradually increase the amount and intensity of exercise routine,  Increase volume of physical activity to a goal of 240 minutes a week, and Combine aerobic and strengthening exercises for efficiency and improved cardiometabolic health.   Pharmacotherapy We discussed various medication options to help Raiden with her weight loss efforts and we both agreed to continue Zepbound  12.5mg .  side effects discussed.  Will consider decreasing dose at next visit based upon weight loss.   ASSOCIATED CONDITIONS ADDRESSED TODAY  Action/Plan  Vitamin D  deficiency -     Vitamin D  (Ergocalciferol ); Take 1 capsule (50,000 Units total) by mouth every 7 (seven) days.  Dispense: 5 capsule; Refill: 0  Elevated ferritin Keep follow up appts with hematology  Polyphagia -     Zepbound ; Inject 12.5 mg into the skin once a week.  Dispense: 2 mL; Refill: 0  Generalized obesity -     Zepbound ; Inject 12.5 mg into the skin once a week.  Dispense: 2 mL; Refill: 0     Goals Increase resistance training Not skipping meals Increase water intake     Return in about 4 weeks (around 11/01/2024).SABRA She was informed of the importance of frequent follow up visits to maximize her success with intensive lifestyle modifications for her multiple health conditions.   ATTESTASTION STATEMENTS:  Reviewed by clinician on day of visit: allergies, medications,  problem list, medical history, surgical history, family history, social history, and previous encounter notes.     Corean SAUNDERS. Jaydien Panepinto FNP-C "

## 2024-10-08 ENCOUNTER — Encounter: Payer: Self-pay | Admitting: Nurse Practitioner

## 2024-10-21 ENCOUNTER — Other Ambulatory Visit: Payer: Self-pay | Admitting: Nurse Practitioner

## 2024-10-21 DIAGNOSIS — R632 Polyphagia: Secondary | ICD-10-CM

## 2024-10-21 DIAGNOSIS — E669 Obesity, unspecified: Secondary | ICD-10-CM

## 2024-10-26 ENCOUNTER — Other Ambulatory Visit: Payer: Self-pay | Admitting: Nurse Practitioner

## 2024-10-26 DIAGNOSIS — E669 Obesity, unspecified: Secondary | ICD-10-CM

## 2024-10-26 DIAGNOSIS — R632 Polyphagia: Secondary | ICD-10-CM

## 2024-10-28 ENCOUNTER — Encounter: Payer: Self-pay | Admitting: Nurse Practitioner

## 2024-10-28 ENCOUNTER — Ambulatory Visit: Admitting: Nurse Practitioner

## 2024-10-28 VITALS — BP 110/66 | HR 69 | Temp 98.0°F | Ht 63.0 in | Wt 143.0 lb

## 2024-10-28 DIAGNOSIS — Z6825 Body mass index (BMI) 25.0-25.9, adult: Secondary | ICD-10-CM | POA: Diagnosis not present

## 2024-10-28 DIAGNOSIS — E669 Obesity, unspecified: Secondary | ICD-10-CM | POA: Diagnosis not present

## 2024-10-28 DIAGNOSIS — E65 Localized adiposity: Secondary | ICD-10-CM | POA: Diagnosis not present

## 2024-10-28 MED ORDER — ZEPBOUND 10 MG/0.5ML ~~LOC~~ SOAJ: 10.0000 mg | SUBCUTANEOUS | 0 refills | Status: AC

## 2024-10-30 ENCOUNTER — Other Ambulatory Visit: Payer: Self-pay | Admitting: Nurse Practitioner

## 2024-10-30 DIAGNOSIS — R632 Polyphagia: Secondary | ICD-10-CM

## 2024-10-30 DIAGNOSIS — R638 Other symptoms and signs concerning food and fluid intake: Secondary | ICD-10-CM

## 2024-11-01 ENCOUNTER — Ambulatory Visit: Admitting: Nurse Practitioner

## 2024-11-14 ENCOUNTER — Inpatient Hospital Stay

## 2024-11-14 ENCOUNTER — Inpatient Hospital Stay: Admitting: Oncology

## 2024-12-05 ENCOUNTER — Ambulatory Visit: Admitting: Nurse Practitioner
